# Patient Record
Sex: Female | Born: 1950 | Race: Black or African American | Hispanic: No | Smoking: Current every day smoker
Health system: Southern US, Community
[De-identification: ages and names within clinical notes are randomized; demographics above are authoritative.]

## PROBLEM LIST (undated history)

## (undated) DIAGNOSIS — Q780 Osteogenesis imperfecta: Secondary | ICD-10-CM

## (undated) DIAGNOSIS — M810 Age-related osteoporosis without current pathological fracture: Secondary | ICD-10-CM

## (undated) DIAGNOSIS — M797 Fibromyalgia: Secondary | ICD-10-CM

## (undated) DIAGNOSIS — M1990 Unspecified osteoarthritis, unspecified site: Secondary | ICD-10-CM

## (undated) DIAGNOSIS — I7300 Raynaud's syndrome without gangrene: Secondary | ICD-10-CM

## (undated) DIAGNOSIS — G4700 Insomnia, unspecified: Secondary | ICD-10-CM

## (undated) DIAGNOSIS — C801 Malignant (primary) neoplasm, unspecified: Secondary | ICD-10-CM

## (undated) DIAGNOSIS — E785 Hyperlipidemia, unspecified: Secondary | ICD-10-CM

## (undated) DIAGNOSIS — F419 Anxiety disorder, unspecified: Secondary | ICD-10-CM

## (undated) DIAGNOSIS — F329 Major depressive disorder, single episode, unspecified: Secondary | ICD-10-CM

## (undated) DIAGNOSIS — I10 Essential (primary) hypertension: Secondary | ICD-10-CM

## (undated) DIAGNOSIS — M542 Cervicalgia: Secondary | ICD-10-CM

## (undated) DIAGNOSIS — G43909 Migraine, unspecified, not intractable, without status migrainosus: Secondary | ICD-10-CM

## (undated) DIAGNOSIS — M25532 Pain in left wrist: Secondary | ICD-10-CM

## (undated) DIAGNOSIS — J449 Chronic obstructive pulmonary disease, unspecified: Secondary | ICD-10-CM

## (undated) DIAGNOSIS — G5600 Carpal tunnel syndrome, unspecified upper limb: Secondary | ICD-10-CM

## (undated) DIAGNOSIS — F32A Depression, unspecified: Secondary | ICD-10-CM

## (undated) HISTORY — DX: Hyperlipidemia, unspecified: E78.5

## (undated) HISTORY — DX: Raynaud's syndrome without gangrene: I73.00

## (undated) HISTORY — DX: Cervicalgia: M54.2

## (undated) HISTORY — DX: Insomnia, unspecified: G47.00

## (undated) HISTORY — PX: HEMORROIDECTOMY: SUR656

## (undated) HISTORY — PX: ABDOMINAL HYSTERECTOMY: SHX81

## (undated) HISTORY — DX: Major depressive disorder, single episode, unspecified: F32.9

## (undated) HISTORY — DX: Depression, unspecified: F32.A

## (undated) HISTORY — DX: Pain in left wrist: M25.532

## (undated) HISTORY — PX: BREAST BIOPSY: SHX20

## (undated) HISTORY — DX: Anxiety disorder, unspecified: F41.9

## (undated) HISTORY — DX: Migraine, unspecified, not intractable, without status migrainosus: G43.909

## (undated) HISTORY — PX: TUBAL LIGATION: SHX77

## (undated) HISTORY — DX: Carpal tunnel syndrome, unspecified upper limb: G56.00

---

## 2006-01-06 ENCOUNTER — Emergency Department (HOSPITAL_COMMUNITY): Admission: EM | Admit: 2006-01-06 | Discharge: 2006-01-06 | Payer: Self-pay | Admitting: Pediatrics

## 2008-11-05 ENCOUNTER — Emergency Department (HOSPITAL_COMMUNITY): Admission: EM | Admit: 2008-11-05 | Discharge: 2008-11-05 | Payer: Self-pay | Admitting: Emergency Medicine

## 2009-10-19 ENCOUNTER — Ambulatory Visit: Payer: Self-pay | Admitting: Surgery

## 2009-10-19 ENCOUNTER — Encounter (INDEPENDENT_AMBULATORY_CARE_PROVIDER_SITE_OTHER): Payer: Self-pay | Admitting: Emergency Medicine

## 2009-10-19 ENCOUNTER — Emergency Department (HOSPITAL_COMMUNITY): Admission: EM | Admit: 2009-10-19 | Discharge: 2009-10-19 | Payer: Self-pay | Admitting: Emergency Medicine

## 2009-10-22 ENCOUNTER — Emergency Department (HOSPITAL_COMMUNITY): Admission: EM | Admit: 2009-10-22 | Discharge: 2009-10-23 | Payer: Self-pay | Admitting: Emergency Medicine

## 2010-10-05 ENCOUNTER — Emergency Department (HOSPITAL_COMMUNITY)
Admission: EM | Admit: 2010-10-05 | Discharge: 2010-10-05 | Disposition: A | Discharge status: Home / Self Care | Payer: Medicaid Other | Attending: Emergency Medicine | Admitting: Emergency Medicine

## 2010-10-05 DIAGNOSIS — J449 Chronic obstructive pulmonary disease, unspecified: Secondary | ICD-10-CM | POA: Insufficient documentation

## 2010-10-05 DIAGNOSIS — M069 Rheumatoid arthritis, unspecified: Secondary | ICD-10-CM | POA: Insufficient documentation

## 2010-10-05 DIAGNOSIS — M25569 Pain in unspecified knee: Secondary | ICD-10-CM | POA: Insufficient documentation

## 2010-10-05 DIAGNOSIS — J4489 Other specified chronic obstructive pulmonary disease: Secondary | ICD-10-CM | POA: Insufficient documentation

## 2010-10-05 DIAGNOSIS — IMO0001 Reserved for inherently not codable concepts without codable children: Secondary | ICD-10-CM | POA: Insufficient documentation

## 2010-10-05 DIAGNOSIS — M25529 Pain in unspecified elbow: Secondary | ICD-10-CM | POA: Insufficient documentation

## 2010-10-05 DIAGNOSIS — I10 Essential (primary) hypertension: Secondary | ICD-10-CM | POA: Insufficient documentation

## 2011-03-15 ENCOUNTER — Emergency Department (HOSPITAL_COMMUNITY): Payer: Medicaid Other

## 2011-03-15 ENCOUNTER — Emergency Department (HOSPITAL_COMMUNITY)
Admission: EM | Admit: 2011-03-15 | Discharge: 2011-03-15 | Disposition: A | Discharge status: Home / Self Care | Payer: Medicaid Other | Attending: Emergency Medicine | Admitting: Emergency Medicine

## 2011-03-15 DIAGNOSIS — M069 Rheumatoid arthritis, unspecified: Secondary | ICD-10-CM | POA: Insufficient documentation

## 2011-03-15 DIAGNOSIS — Z87891 Personal history of nicotine dependence: Secondary | ICD-10-CM | POA: Insufficient documentation

## 2011-03-15 LAB — POCT I-STAT, CHEM 8
BUN: <3 mg/dL — ABNORMAL LOW (ref 6–23)
Calcium, Ion: 1.19 mmol/L (ref 1.12–1.32)
Chloride: 103 mEq/L (ref 96–112)
Creatinine, Ser: 0.9 mg/dL (ref 0.50–1.10)
Glucose, Bld: 90 mg/dL (ref 70–99)
HCT: 40 % (ref 36.0–46.0)
Hemoglobin: 13.6 g/dL (ref 12.0–15.0)
Potassium: 3.5 mEq/L (ref 3.5–5.1)
Sodium: 143 mEq/L (ref 135–145)
TCO2: 28 mmol/L (ref 0–100)

## 2011-03-15 LAB — URINALYSIS, ROUTINE W REFLEX MICROSCOPIC
Bilirubin Urine: NEGATIVE
Glucose, UA: NEGATIVE mg/dL
Hgb urine dipstick: NEGATIVE
Ketones, ur: NEGATIVE mg/dL
Leukocytes, UA: NEGATIVE
Nitrite: NEGATIVE
Protein, ur: NEGATIVE mg/dL
Specific Gravity, Urine: 1.011 (ref 1.005–1.030)
Urobilinogen, UA: 0.2 mg/dL (ref 0.0–1.0)
pH: 7 (ref 5.0–8.0)

## 2011-03-15 LAB — SEDIMENTATION RATE: Sed Rate: 23 mm/hr — ABNORMAL HIGH (ref 0–22)

## 2011-05-02 ENCOUNTER — Emergency Department (HOSPITAL_COMMUNITY)
Admission: EM | Admit: 2011-05-02 | Discharge: 2011-05-02 | Disposition: A | Discharge status: Home / Self Care | Payer: Medicaid Other | Attending: Emergency Medicine | Admitting: Emergency Medicine

## 2011-05-02 DIAGNOSIS — J4489 Other specified chronic obstructive pulmonary disease: Secondary | ICD-10-CM | POA: Insufficient documentation

## 2011-05-02 DIAGNOSIS — I10 Essential (primary) hypertension: Secondary | ICD-10-CM | POA: Insufficient documentation

## 2011-05-02 DIAGNOSIS — E876 Hypokalemia: Secondary | ICD-10-CM | POA: Insufficient documentation

## 2011-05-02 DIAGNOSIS — J449 Chronic obstructive pulmonary disease, unspecified: Secondary | ICD-10-CM | POA: Insufficient documentation

## 2011-05-02 DIAGNOSIS — M069 Rheumatoid arthritis, unspecified: Secondary | ICD-10-CM | POA: Insufficient documentation

## 2011-05-02 DIAGNOSIS — M549 Dorsalgia, unspecified: Secondary | ICD-10-CM | POA: Insufficient documentation

## 2011-05-02 DIAGNOSIS — M129 Arthropathy, unspecified: Secondary | ICD-10-CM | POA: Insufficient documentation

## 2011-05-02 LAB — POCT I-STAT, CHEM 8
BUN: <3 mg/dL — ABNORMAL LOW (ref 6–23)
Calcium, Ion: 1.21 mmol/L (ref 1.12–1.32)
Chloride: 104 mEq/L (ref 96–112)
Creatinine, Ser: 0.9 mg/dL (ref 0.50–1.10)
Glucose, Bld: 91 mg/dL (ref 70–99)
HCT: 41 % (ref 36.0–46.0)
Hemoglobin: 13.9 g/dL (ref 12.0–15.0)
Potassium: 2.9 mEq/L — ABNORMAL LOW (ref 3.5–5.1)
Sodium: 142 mEq/L (ref 135–145)
TCO2: 28 mmol/L (ref 0–100)

## 2011-05-02 LAB — URINALYSIS, ROUTINE W REFLEX MICROSCOPIC
Glucose, UA: NEGATIVE mg/dL
Hgb urine dipstick: NEGATIVE
Ketones, ur: NEGATIVE mg/dL
Leukocytes, UA: NEGATIVE
Nitrite: NEGATIVE
Protein, ur: NEGATIVE mg/dL
Specific Gravity, Urine: 1.022 (ref 1.005–1.030)
Urobilinogen, UA: 1 mg/dL (ref 0.0–1.0)
pH: 5.5 (ref 5.0–8.0)

## 2011-05-09 ENCOUNTER — Encounter: Payer: Self-pay | Admitting: *Deleted

## 2011-05-09 ENCOUNTER — Emergency Department (HOSPITAL_COMMUNITY)
Admission: EM | Admit: 2011-05-09 | Discharge: 2011-05-09 | Discharge status: Against Medical Advice | Payer: Medicaid Other | Attending: Emergency Medicine | Admitting: Emergency Medicine

## 2011-05-09 DIAGNOSIS — M79609 Pain in unspecified limb: Secondary | ICD-10-CM | POA: Insufficient documentation

## 2011-05-09 DIAGNOSIS — M545 Low back pain, unspecified: Secondary | ICD-10-CM | POA: Insufficient documentation

## 2011-05-09 DIAGNOSIS — E876 Hypokalemia: Secondary | ICD-10-CM | POA: Insufficient documentation

## 2011-05-09 HISTORY — DX: Fibromyalgia: M79.7

## 2011-05-09 HISTORY — DX: Unspecified osteoarthritis, unspecified site: M19.90

## 2011-05-09 HISTORY — DX: Chronic obstructive pulmonary disease, unspecified: J44.9

## 2011-05-09 HISTORY — DX: Essential (primary) hypertension: I10

## 2011-05-09 NOTE — ED Notes (Signed)
Pt called, no answer.  Called in main and triage waiting

## 2011-05-09 NOTE — ED Notes (Signed)
Pt was seen here last week with back pain and low potassium.  She is waiting to see pcp because they are waiting on infor from [place].  Pt needs potassium rechecked and now with pain in back that hurts to breath.  PT states that the lower back pain is now spreading down right leg.  Pt denies incontinence

## 2011-05-12 ENCOUNTER — Other Ambulatory Visit: Payer: Self-pay

## 2011-05-12 ENCOUNTER — Emergency Department (HOSPITAL_COMMUNITY)
Admission: EM | Admit: 2011-05-12 | Discharge: 2011-05-12 | Discharge status: Against Medical Advice | Payer: Medicaid Other | Attending: Emergency Medicine | Admitting: Emergency Medicine

## 2011-05-12 ENCOUNTER — Encounter (HOSPITAL_COMMUNITY): Payer: Self-pay | Admitting: *Deleted

## 2011-05-12 DIAGNOSIS — R0602 Shortness of breath: Secondary | ICD-10-CM | POA: Insufficient documentation

## 2011-05-12 DIAGNOSIS — R079 Chest pain, unspecified: Secondary | ICD-10-CM | POA: Insufficient documentation

## 2011-05-12 LAB — BASIC METABOLIC PANEL
BUN: 6 mg/dL (ref 6–23)
CO2: 32 mEq/L (ref 19–32)
Calcium: 10.3 mg/dL (ref 8.4–10.5)
Chloride: 103 mEq/L (ref 96–112)
Creatinine, Ser: 0.76 mg/dL (ref 0.50–1.10)
GFR calc Af Amer: >90 mL/min (ref >90)
GFR calc non Af Amer: 90 mL/min — ABNORMAL LOW (ref >90)
Glucose, Bld: 92 mg/dL (ref 70–99)
Potassium: 4.1 mEq/L (ref 3.5–5.1)
Sodium: 143 mEq/L (ref 135–145)

## 2011-05-12 LAB — CBC
HCT: 42.4 % (ref 36.0–46.0)
Hemoglobin: 14.2 g/dL (ref 12.0–15.0)
MCH: 27.5 pg (ref 26.0–34.0)
MCHC: 33.5 g/dL (ref 30.0–36.0)
MCV: 82 fL (ref 78.0–100.0)
Platelets: 177 10*3/uL (ref 150–400)
RBC: 5.17 MIL/uL — ABNORMAL HIGH (ref 3.87–5.11)
RDW: 14.1 % (ref 11.5–15.5)
WBC: 8.5 10*3/uL (ref 4.0–10.5)

## 2011-05-12 NOTE — ED Notes (Signed)
Pt states she came to ed for eval of cp that radiates straight through to back. Pt states she was in ed on Saturday but never called so she left. Pain is described as spasms. Pt new to the area.

## 2011-06-18 ENCOUNTER — Encounter: Payer: Self-pay | Admitting: Internal Medicine

## 2011-06-19 ENCOUNTER — Ambulatory Visit (INDEPENDENT_AMBULATORY_CARE_PROVIDER_SITE_OTHER)
Admission: RE | Admit: 2011-06-19 | Discharge: 2011-06-19 | Disposition: A | Discharge status: Home / Self Care | Payer: Medicaid Other | Source: Ambulatory Visit | Attending: Internal Medicine | Admitting: Internal Medicine

## 2011-06-19 ENCOUNTER — Ambulatory Visit (INDEPENDENT_AMBULATORY_CARE_PROVIDER_SITE_OTHER): Payer: Medicaid Other | Admitting: Internal Medicine

## 2011-06-19 ENCOUNTER — Encounter: Payer: Self-pay | Admitting: Internal Medicine

## 2011-06-19 VITALS — BP 130/88 | HR 71 | Temp 98.5°F | Ht 67.0 in | Wt 184.0 lb

## 2011-06-19 DIAGNOSIS — J449 Chronic obstructive pulmonary disease, unspecified: Secondary | ICD-10-CM

## 2011-06-19 DIAGNOSIS — I10 Essential (primary) hypertension: Secondary | ICD-10-CM

## 2011-06-19 MED ORDER — NEBIVOLOL HCL 5 MG PO TABS: 5.0000 mg | ORAL_TABLET | Freq: Every day | ORAL | Status: DC

## 2011-06-19 NOTE — Progress Notes (Signed)
  Subjective:    Patient ID: Denise Simmons, female    DOB: [birth year], 60 y.o.   MRN: [id number]  HPI  60 yobf still smoker with COPD    06/19/2011 60st pulmonary cc worse sob  since Oct 30th 2012 when moved to [place] cc sorethroat, trouble swallowing s/p dilation on ACEI cough severe, mostly daytime and dry, comes and goes,  right now better,  Sometimes vomiting. mutiple nebs and inhalers not really helping but the cough med helps the most with her breathing which is better as long as cough is controlled  Sleeping ok without nocturnal  or early am exacerbation  of respiratory  c/o's or need for noct saba. Also denies any obvious fluctuation of symptoms with weather or environmental changes or other aggravating or alleviating factors except as outlined above   Has RA but  States ok control.    Review of Systems  Constitutional: Negative for fever, chills and unexpected weight change.  HENT: Positive for ear pain, sore throat and trouble swallowing. Negative for nosebleeds, congestion, rhinorrhea, sneezing, dental problem, voice change, postnasal drip and sinus pressure.   Eyes: Negative for visual disturbance.  Respiratory: Positive for cough and shortness of breath. Negative for choking.   Cardiovascular: Negative for chest pain and leg swelling.  Gastrointestinal: Negative for vomiting, abdominal pain and diarrhea.  Genitourinary: Negative for difficulty urinating.  Musculoskeletal: Positive for arthralgias.  Skin: Negative for rash.  Neurological: Negative for tremors, syncope and headaches.  Hematological: Does not bruise/bleed easily.       Objective:   Physical Exam  06/19/2011  Wt 184  amb bf nad HEENT mild turbinate edema.  Oropharynx no thrush or excess pnd or cobblestoning.  No JVD or cervical adenopathy. Mild accessory muscle hypertrophy. Trachea midline, nl thryroid. Chest was hyperinflated by percussion with diminished breath sounds and moderate increased exp time  without wheeze. Hoover sign positive at mid inspiration. Regular rate and rhythm without murmur gallop or rub or increase P2 or edema.  Abd: no hsm, nl excursion. Ext warm without cyanosis or clubbing.    CXR  06/19/2011 :  Comparison: 03/15/2011  Findings: Heart and mediastinal contours are within normal limits. No focal opacities or effusions. No acute bony abnormality.  IMPRESSION: No active cardiopulmonary disease.      Assessment & Plan:

## 2011-06-19 NOTE — Patient Instructions (Addendum)
Try aciphex  20mg   (or Nexium) Take 30-60 min before first meal of the day and Pepcid 20 mg one bedtime   Stop lisinopril and start bystolic 5 mg one in it's place  Stop smoking before it stops it   Only use the nebulizer for your breathing if you need it  Please remember to go to the  x-ray department downstairs for your tests - we will call you with the results when they are available.    Please schedule a follow up office visit in 4 weeks, sooner if needed with PFT's

## 2011-06-20 DIAGNOSIS — I10 Essential (primary) hypertension: Secondary | ICD-10-CM | POA: Insufficient documentation

## 2011-06-20 NOTE — Assessment & Plan Note (Addendum)
DDX of  difficult airways managment all start with A and  include Adherence, Ace Inhibitors, Acid Reflux, Active Sinus Disease, Alpha 1 Antitripsin deficiency, Anxiety masquerading as Airways dz,  ABPA,  allergy(esp in young), Aspiration (esp in elderly), Adverse effects of DPI,  Active smokers, plus two Bs  = Bronchiectasis and Beta blocker use..and one C= CHF   Ace inhibitors leading suspect here given all the upper airway symptoms she has. Try off these first and then regroup with pft's  Acid reflux is sometimes the fuel for the fire of upper airways instability and acei intolerance > try rx with ppi and h2hs and diet, discussed

## 2011-06-20 NOTE — Assessment & Plan Note (Addendum)
ACE inhibitors are problematic in  pts with airway complaints because  even experienced pulmonologists can't always distinguish ace effects from copd/asthma.  By themselves they don't actually cause a problem, much like oxygen can't by itself start a fire, but they certainly serve as a powerful catalyst or enhancer for any "fire"  or inflammatory process in the upper airway, be it caused by an ET  tube or more commonly reflux (especially in the obese or pts with known GERD or who are on biphoshonates).   For now try on Bystolic, the most beta -1  selective Beta blocker available in sample form, with bisoprolol the most selective generic choice  on the market.

## 2011-07-23 ENCOUNTER — Ambulatory Visit: Payer: Medicaid Other | Admitting: Internal Medicine

## 2011-08-07 ENCOUNTER — Encounter: Payer: Self-pay | Admitting: Internal Medicine

## 2011-08-07 ENCOUNTER — Ambulatory Visit (INDEPENDENT_AMBULATORY_CARE_PROVIDER_SITE_OTHER): Payer: Medicaid Other | Admitting: Internal Medicine

## 2011-08-07 VITALS — BP 128/70 | HR 66 | Temp 98.3°F | Ht 67.0 in | Wt 186.0 lb

## 2011-08-07 DIAGNOSIS — M069 Rheumatoid arthritis, unspecified: Secondary | ICD-10-CM

## 2011-08-07 DIAGNOSIS — J449 Chronic obstructive pulmonary disease, unspecified: Secondary | ICD-10-CM

## 2011-08-07 DIAGNOSIS — I10 Essential (primary) hypertension: Secondary | ICD-10-CM

## 2011-08-07 DIAGNOSIS — F17210 Nicotine dependence, cigarettes, uncomplicated: Secondary | ICD-10-CM | POA: Insufficient documentation

## 2011-08-07 DIAGNOSIS — F17200 Nicotine dependence, unspecified, uncomplicated: Secondary | ICD-10-CM

## 2011-08-07 LAB — PULMONARY FUNCTION TEST

## 2011-08-07 MED ORDER — NEBIVOLOL HCL 5 MG PO TABS: 5.0000 mg | ORAL_TABLET | Freq: Every day | ORAL | Status: DC

## 2011-08-07 NOTE — Progress Notes (Signed)
  Subjective:    Patient ID: Denise Simmons, female    DOB: 1950/09/14    MRN: [id number]    Brief patient profile:  89 yobf still smoker with COPD and refractory symptoms on ACEI when first seen in pulmonary clinic in 2012 but proved to have very minimal airflow obst by pft's 08/07/2011   HPI 06/19/2011 1st pulmonary cc worse sob  since Oct 30th 2012 when moved to [place] cc sorethroat, trouble swallowing s/p dilation on ACEI cough severe, mostly daytime and dry, comes and goes,  right now better,  Sometimes vomiting. mutiple nebs and inhalers not really helping but the cough med helps the most with her breathing which is better as long as cough is controlled Has RA but  States ok control. rec Try aciphex  20mg   (or Nexium) Take 30-60 min before first meal of the day and Pepcid 20 mg one bedtime  Stop lisinopril and start bystolic 5 mg one in it's place Stop smoking before it stops it  Only use the nebulizer for your breathing if you need it   08/07/2011 Gottlieb Zuercher/ ov  still smoking but cough much better not needing any breathing treatments at all, main c/o is RA symptoms getting worse and concerned being referred to [facility] since it's so far for her to go.  No excess mucus, no limiting sob  Sleeping ok without nocturnal  or early am exacerbation  of respiratory  c/o's or need for noct saba. Also denies any obvious fluctuation of symptoms with weather or environmental changes or other aggravating or alleviating factors except as outlined above   ROS  At present neg for  any significant sore throat, dysphagia, itching, sneezing,  nasal congestion or excess/ purulent secretions,  fever, chills, sweats, unintended wt loss, pleuritic or exertional cp, hempoptysis, orthopnea pnd or leg swelling.  Also denies presyncope, palpitations, heartburn, abdominal pain, nausea, vomiting, diarrhea  or change in bowel or urinary habits, dysuria,hematuria,  rash, arthralgias, visual complaints, headache, numbness weakness or  ataxia.         Objective:   Physical Exam  06/19/2011  Wt 184 > 08/07/2011 186  amb bf nad HEENT mild turbinate edema.  Oropharynx no thrush or excess pnd or cobblestoning.  No JVD or cervical adenopathy. Mild accessory muscle hypertrophy. Trachea midline, nl thryroid. Chest was minimally  hyperinflated by percussion with diminished breath sounds and very min increased exp time without wheeze. Hoover sign positive at end inspiration. Regular rate and rhythm without murmur gallop or rub or increase P2 or edema.  Abd: no hsm, nl excursion. Ext warm without cyanosis or clubbing.    CXR  06/19/2011 :  Comparison: 03/15/2011  Findings: Heart and mediastinal contours are within normal limits. No focal opacities or effusions. No acute bony abnormality.  IMPRESSION: No active cardiopulmonary disease.      Assessment & Plan:

## 2011-08-07 NOTE — Patient Instructions (Signed)
Continue bystolic 5mg  daily (generic bisoprolol 5 mg)   You cannot take ACE inhibitor (ends in pril)  Please see patient coordinator before you leave today  to schedule rheumatology evaluation in [place]  Stop smoking before it stops you - clearly not too late to stop  Follow up here is as needed.

## 2011-08-07 NOTE — Progress Notes (Deleted)
  Subjective:    Patient ID: Denise Simmons, female    DOB: 12-Mar-1951, 61 y.o.   MRN: [id number]   Brief patient profile:  80 yobf still smoker with  With refractory "COPD symptoms" on ACEI seen 08/07/2011 in pulmonary clinic by Winslow Verrill  HPI 06/19/2011 1st pulmonary ov/ [facility] cc worse sob  since Oct 30th 2012 when moved to [place] cc sorethroat, trouble swallowing s/p dilation on ACEI cough severe, mostly daytime and dry, comes and goes,  right now better,  Sometimes vomiting. mutiple nebs and inhalers not really helping but the cough med helps the most with her breathing which is better as long as cough is controlled Has RA but  States ok control rec Try aciphex  20mg   (or Nexium) Take 30-60 min before first meal of the day and Pepcid 20 mg one bedtime  Stop lisinopril and start bystolic 5 mg one in it's place Stop smoking before it stops it  Only use the nebulizer for your breathing if you need it        08/07/2011 f/u ov/Kaeden Mester cc   Sleeping ok without nocturnal  or early am exacerbation  of respiratory  c/o's or need for noct saba. Also denies any obvious fluctuation of symptoms with weather or environmental changes or other aggravating or alleviating factors except as outlined above   .          Objective:   Physical Exam  06/19/2011  Wt 184 > 08/07/2011  186  amb bf nad HEENT mild turbinate edema.  Oropharynx no thrush or excess pnd or cobblestoning.  No JVD or cervical adenopathy. Mild accessory muscle hypertrophy. Trachea midline, nl thryroid. Chest was hyperinflated by percussion with diminished breath sounds and moderate increased exp time without wheeze. Hoover sign positive at mid inspiration. Regular rate and rhythm without murmur gallop or rub or increase P2 or edema.  Abd: no hsm, nl excursion. Ext warm without cyanosis or clubbing.    CXR  06/19/2011 :  Comparison: 03/15/2011  Findings: Heart and mediastinal contours are within normal limits. No focal opacities or  effusions. No acute bony abnormality.  IMPRESSION: No active cardiopulmonary disease.      Assessment & Plan:

## 2011-08-07 NOTE — Assessment & Plan Note (Signed)
Adequate control on present rx, reviewed need to stay off ACEI  ACE inhibitors are problematic in  pts with airway complaints because  even experienced pulmonologists can't always distinguish ace effects from copd/asthma/pnds/ allergies etc.  By themselves they don't actually cause a problem, much like oxygen can't by itself start a fire, but they certainly serve as a powerful catalyst or enhancer for any "fire"  or inflammatory process in the upper airway, be it caused by an ET  tube or more commonly reflux (especially in the obese or pts with known GERD or who are on biphoshonates) or URI's, due to interference with bradykinin clearance.  The effects of acei on bradykinin levels occurs in 100% of pt's on acei (unless they surreptitiously stop the med!) but the classic cough is only reported in 5%.  This leaves 95% of pts on acei's  with a variety of syndromes including no identifiable symptom in most  vs non-specific symptoms that wax and wane depending on what other insult is occuring at the level of the upper airway.   In her case rec avoid acei ever and Strongly prefer in this setting: Bystolic, the most beta -1  selective Beta blocker available in sample form, with bisoprolol the most selective generic choice  on the market.

## 2011-08-07 NOTE — Assessment & Plan Note (Signed)
-    off ACEI 06/19/2011  > much better 08/07/2011     -  PFT's  08/07/2011  with min airflow obst but DLCO 60% corrects to 86% ? RA related  GOLD 0 copd / still smoking with disproportionate decrease dlco worrisome for ild related to smoking or ra  I reviewed the Fletcher curve with patient that basically indicates  if you quit smoking when your best day FEV1 is still well preserved (which hers certainly is)  it is highly unlikely you will progress to severe disease and informed the patient there was no medication on the market that has proven to change the curve or the likelihood of progression.  Therefore stopping smoking and maintaining abstinence is the most important aspect of care, not choice of inhalers or for that matter, doctors.    Pulmonary f/u can be prn

## 2011-08-07 NOTE — Assessment & Plan Note (Signed)

## 2011-08-07 NOTE — Progress Notes (Signed)
PFT done today. 

## 2011-08-14 ENCOUNTER — Other Ambulatory Visit: Payer: Self-pay | Admitting: Diagnostic Neuroimaging

## 2011-08-14 DIAGNOSIS — R51 Headache: Secondary | ICD-10-CM

## 2011-08-14 DIAGNOSIS — M545 Low back pain, unspecified: Secondary | ICD-10-CM

## 2011-08-17 ENCOUNTER — Emergency Department (HOSPITAL_COMMUNITY)
Admission: EM | Admit: 2011-08-17 | Discharge: 2011-08-17 | Disposition: A | Discharge status: Home / Self Care | Payer: Medicaid Other | Attending: Emergency Medicine | Admitting: Emergency Medicine

## 2011-08-17 ENCOUNTER — Encounter (HOSPITAL_COMMUNITY): Payer: Self-pay | Admitting: Emergency Medicine

## 2011-08-17 DIAGNOSIS — G8929 Other chronic pain: Secondary | ICD-10-CM | POA: Insufficient documentation

## 2011-08-17 DIAGNOSIS — M79609 Pain in unspecified limb: Secondary | ICD-10-CM | POA: Insufficient documentation

## 2011-08-17 DIAGNOSIS — M545 Low back pain, unspecified: Secondary | ICD-10-CM | POA: Insufficient documentation

## 2011-08-17 DIAGNOSIS — J4489 Other specified chronic obstructive pulmonary disease: Secondary | ICD-10-CM | POA: Insufficient documentation

## 2011-08-17 DIAGNOSIS — M5430 Sciatica, unspecified side: Secondary | ICD-10-CM | POA: Insufficient documentation

## 2011-08-17 DIAGNOSIS — I10 Essential (primary) hypertension: Secondary | ICD-10-CM | POA: Insufficient documentation

## 2011-08-17 DIAGNOSIS — J449 Chronic obstructive pulmonary disease, unspecified: Secondary | ICD-10-CM | POA: Insufficient documentation

## 2011-08-17 DIAGNOSIS — I7300 Raynaud's syndrome without gangrene: Secondary | ICD-10-CM | POA: Insufficient documentation

## 2011-08-17 DIAGNOSIS — IMO0001 Reserved for inherently not codable concepts without codable children: Secondary | ICD-10-CM | POA: Insufficient documentation

## 2011-08-17 MED ORDER — HYDROMORPHONE HCL PF 1 MG/ML IJ SOLN
1.0000 mg | Freq: Once | INTRAMUSCULAR | Status: AC
  Administered 2011-08-17: 1 mg via INTRAMUSCULAR
  Filled 2011-08-17: qty 1

## 2011-08-17 NOTE — ED Provider Notes (Signed)
History     CSN: [id number]  Arrival date & time 08/17/11  1108   First MD Initiated Contact with Patient 08/17/11 1207      Chief Complaint  Patient presents with  . Back Pain    (Consider location/radiation/quality/duration/timing/severity/associated sxs/prior treatment) HPI Comments: Patient has a history of chronic back pain for which he sees a pain clinic in [place]. The pain is in her right lower back going down her right leg. She recently over the last couple months has had some worsening in symptoms with worsening pain down the right leg and has seen a neurologist here in [place] for this. She is scheduled to have MRI on February 22. She denies any new symptoms or new injuries. Denies any numbness or weakness in her legs. Denies any loss of bowel or bladder control. She just came in today because the pain has not been controlled by her pain medicine.  Patient is a 61 y.o. female presenting with back pain. The history is provided by the patient.  Back Pain  This is a chronic problem. Pertinent negatives include no chest pain, no fever, no numbness, no headaches, no abdominal pain and no weakness.    Past Medical History  Diagnosis Date  . Arthritis   . COPD (chronic obstructive pulmonary disease)   . Fibromyalgia   . Hypertension   . Insomnia   . Allergic rhinitis   . Neck pain   . Raynaud's disease     Past Surgical History  Procedure Date  . Abdominal hysterectomy   . Tubal ligation     Family History  Problem Relation Age of Onset  . Rheum arthritis Mother   . Breast cancer Mother   . Breast cancer Sister   . Ovarian cancer Maternal Grandmother   . Rectal cancer Paternal Grandmother     History  Substance Use Topics  . Smoking status: Current Everyday Smoker -- 1.0 packs/day for 25 years    Types: Cigarettes  . Smokeless tobacco: Never Used  . Alcohol Use: No     Quit in 2001    OB History    Grav Para Term Preterm Abortions TAB SAB Ect Mult  Living                  Review of Systems  Constitutional: Negative for fever, chills, diaphoresis and fatigue.  HENT: Negative for congestion, rhinorrhea and sneezing.   Eyes: Negative.   Respiratory: Negative for cough, chest tightness and shortness of breath.   Cardiovascular: Negative for chest pain and leg swelling.  Gastrointestinal: Negative for nausea, vomiting, abdominal pain, diarrhea and blood in stool.  Genitourinary: Negative for frequency, hematuria, flank pain and difficulty urinating.  Musculoskeletal: Positive for back pain. Negative for arthralgias.  Skin: Negative for rash.  Neurological: Negative for dizziness, speech difficulty, weakness, numbness and headaches.    Allergies  Prednisone; Celebrex; Methotrexate derivatives; Naproxen; Nsaids; Penicillins; and Sulfa antibiotics  Home Medications   Current Outpatient Rx  Name Route Sig Dispense Refill  . ALBUTEROL SULFATE HFA 108 (90 BASE) MCG/ACT IN AERS Inhalation Inhale 2 puffs into the lungs 3 (three) times daily as needed. For shortness of breath     . ALBUTEROL SULFATE (2.5 MG/3ML) 0.083% IN NEBU Nebulization Take 2.5 mg by nebulization every 6 (six) hours as needed. For wheezing     . CYCLOBENZAPRINE HCL 10 MG PO TABS Oral Take 10 mg by mouth 3 (three) times daily as needed. For spasms    .  ESOMEPRAZOLE MAGNESIUM 40 MG PO CPDR Oral Take 40 mg by mouth 2 (two) times daily.      Marland Kitchen MONTELUKAST SODIUM 10 MG PO TABS Oral Take 10 mg by mouth at bedtime.      . NEBIVOLOL HCL 5 MG PO TABS Oral Take 1 tablet (5 mg total) by mouth daily. 30 tablet 11  . NYSTATIN 100000 UNIT/GM EX CREA Topical Apply 1 application topically at bedtime as needed. For sores    . OXYCODONE-ACETAMINOPHEN 7.5-500 MG PO TABS Oral Take 1 tablet by mouth every 6 (six) hours as needed. for pain    . POTASSIUM CHLORIDE 10 MEQ PO TBCR Oral Take 10 mEq by mouth 2 (two) times daily.      Marland Kitchen PREGABALIN 75 MG PO CAPS Oral Take 75 mg by mouth 2 (two)  times daily.      Marland Kitchen ZOLPIDEM TARTRATE 10 MG PO TABS Oral Take 10 mg by mouth at bedtime as needed. For sleep     . ADALIMUMAB 40 MG/0.8ML [place] KIT Subcutaneous Inject 40 mg into the skin every 3 (three) months.     . AMITRIPTYLINE HCL 150 MG PO TABS Oral Take 150 mg by mouth at bedtime.        BP 136/81  Pulse 66  Temp(Src) 98.8 F (37.1 C) (Oral)  Resp 18  SpO2 95%  Physical Exam  Constitutional: She is oriented to person, place, and time. She appears well-developed and well-nourished.  HENT:  Head: Normocephalic and atraumatic.  Eyes: Pupils are equal, round, and reactive to light.  Neck: Normal range of motion. Neck supple.  Cardiovascular: Normal rate, regular rhythm and normal heart sounds.   Pulmonary/Chest: Effort normal and breath sounds normal. No respiratory distress. She has no wheezes. She has no rales. She exhibits no tenderness.  Abdominal: Soft. Bowel sounds are normal. There is no tenderness. There is no rebound and no guarding.  Musculoskeletal: Normal range of motion. She exhibits no edema.       Positive tenderness to the right lower paraspinal area in the lumbar region and over the right sciatic nerve. Negative straight leg raise bilaterally.  Lymphadenopathy:    She has no cervical adenopathy.  Neurological: She is alert and oriented to person, place, and time. She has normal strength. She displays normal reflexes. No sensory deficit. She exhibits normal muscle tone.  Skin: Skin is warm and dry. No rash noted.  Psychiatric: She has a normal mood and affect.    ED Course  Procedures (including critical care time)  Labs Reviewed - No data to display No results found.   1. Sciatica       MDM  Patient with worsening of her chronic back pain. Advised her that we would not be able to do the MRI today. She has new neuro deficits. She has one scheduled on the 22nd which is appropriate. I also advised her that we can give her a shot of pain medicine here but  would not be able to change her outpatient pain medicines as she does go to a pain clinic. I advised her to contact her pain clinic regarding possible adjustment of her pain medicines.        Rolan Bucco, MD 08/17/11 [phone]

## 2011-08-17 NOTE — ED Notes (Signed)
Pt sts lower back pain with radiation down right leg x several months

## 2011-08-17 NOTE — Discharge Instructions (Signed)
Chronic Back Pain When back pain lasts longer than 3 months, it is called chronic back pain.This pain can be frustrating, but the cause of the pain is rarely dangerous.People with chronic back pain often go through certain periods that are more intense (flare-ups). CAUSES Chronic back pain can be caused by wear and tear (degeneration) on different structures in your back. These structures may include bones, ligaments, or discs. This degeneration may result in more pressure being placed on the nerves that travel to your legs and feet. This can lead to pain traveling from the low back down the back of the legs. When pain lasts longer than 3 months, it is not unusual for people to experience anxiety or depression. Anxiety and depression can also contribute to low back pain. TREATMENT  Establish a regular exercise plan. This is critical to improving your functional level.   Have a self-management plan for when you flare-up. Flare-ups rarely require a medical visit. Regular exercise will help reduce the intensity and frequency of your flare-ups.   Manage how you feel about your back pain and the rest of your life. Anxiety, depression, and feeling that you cannot alter your back pain have been shown to make back pain more intense and debilitating.   Medicines should never be your only treatment. They should be used along with other treatments to help you return to a more active lifestyle.   Procedures such as injections or surgery may be helpful but are rarely necessary. You may be able to get the same results with physical therapy or chiropractic care.  HOME CARE INSTRUCTIONS  Avoid bending, heavy lifting, prolonged sitting, and activities which make the problem worse.   Continue normal activity as much as possible.   Take brief periods of rest throughout the day to reduce your pain during flare-ups.   Follow your back exercise rehabilitation program. This can help reduce symptoms and prevent  more pain.   Only take over-the-counter or prescription medicines as directed by your caregiver. Muscle relaxants are sometimes prescribed. Narcotic pain medicine is discouraged for long-term pain, since addiction is a possible outcome.   If you smoke, quit.   Eat healthy foods and maintain a recommended body weight.  SEEK IMMEDIATE MEDICAL CARE IF:   You have weakness or numbness in one of your legs or feet.   You have trouble controlling your bladder or bowels.   You develop nausea, vomiting, abdominal pain, shortness of breath, or fainting.  Document Released: 07/23/2004 Document Revised: 02/25/2011 Document Reviewed: 11/03/2010 [employer] Patient Information 2012 [employer]. 

## 2011-08-18 ENCOUNTER — Encounter: Payer: Self-pay | Admitting: Internal Medicine

## 2011-08-20 ENCOUNTER — Ambulatory Visit
Admission: RE | Admit: 2011-08-20 | Discharge: 2011-08-20 | Disposition: A | Discharge status: Home / Self Care | Payer: Medicaid Other | Source: Ambulatory Visit | Attending: Diagnostic Neuroimaging | Admitting: Diagnostic Neuroimaging

## 2011-08-20 DIAGNOSIS — M545 Low back pain, unspecified: Secondary | ICD-10-CM

## 2011-08-20 DIAGNOSIS — R51 Headache: Secondary | ICD-10-CM

## 2011-08-21 ENCOUNTER — Other Ambulatory Visit: Payer: Medicaid Other

## 2011-09-15 ENCOUNTER — Telehealth: Payer: Self-pay | Admitting: Internal Medicine

## 2011-09-15 ENCOUNTER — Other Ambulatory Visit: Payer: Self-pay | Admitting: Neurosurgery

## 2011-09-15 NOTE — Telephone Encounter (Signed)
Received fax prior auth request for bystolic from [facility].  Called [phone] to initiate this.  Preferred medications are coreg, lopressor, atenolol, corgard, propanolol, sotalol.  Pt will need to try and fail at least 2 of these medications.  Dr. Sherene Sires, pls advise if bystolic 5 mg qd can be changed to one of these medications.  Thank you.    Pt's ID # [id number]

## 2011-09-15 NOTE — Telephone Encounter (Signed)
[id number] x1 

## 2011-09-15 NOTE — Telephone Encounter (Signed)
ATC pt NA wcb unable to leave [facility]

## 2011-09-15 NOTE — Telephone Encounter (Signed)
Not sure this is the right choice for her anyway so provide her samples of bystolic until her next ov with me or Tammy NP before samples run out

## 2011-09-16 NOTE — Telephone Encounter (Signed)
ATC pt- na wcb 

## 2011-09-16 NOTE — Telephone Encounter (Signed)
I spoke with pt and will come and pick up samples of bystolic. i have left these upfront for pick up and nothing further was needed

## 2011-09-16 NOTE — Telephone Encounter (Signed)
I spoke with pt and she states she is beginning to start coughing again. She went to her pcp yesterday and was told she had thrush adn was given medication. Pt wanted to schedule an apt in case she is not any better by next week. I scheduled her to see TP on Tuesday at 2:15 if not any better. Nothing further was needed

## 2011-09-22 ENCOUNTER — Encounter: Payer: Self-pay | Admitting: Adult Health

## 2011-09-22 ENCOUNTER — Ambulatory Visit (INDEPENDENT_AMBULATORY_CARE_PROVIDER_SITE_OTHER): Payer: Medicaid Other | Admitting: Adult Health

## 2011-09-22 VITALS — BP 126/86 | HR 77 | Temp 98.1°F | Ht 67.0 in | Wt 183.4 lb

## 2011-09-22 DIAGNOSIS — J449 Chronic obstructive pulmonary disease, unspecified: Secondary | ICD-10-CM

## 2011-09-22 MED ORDER — CEFDINIR 300 MG PO CAPS: 300.0000 mg | ORAL_CAPSULE | Freq: Two times a day (BID) | ORAL | Status: DC

## 2011-09-22 MED ORDER — HYDROCODONE-HOMATROPINE 5-1.5 MG/5ML PO SYRP: 5.0000 mL | ORAL_SOLUTION | Freq: Four times a day (QID) | ORAL | Status: DC | PRN

## 2011-09-22 NOTE — Progress Notes (Signed)
Subjective:    Patient ID: Denise Simmons, female    DOB: [birth year]    MRN: [id number]    Brief patient profile:  57 yobf still smoker with COPD and refractory symptoms on ACEI when first seen in pulmonary clinic in 2012 but proved to have very minimal airflow obst by pft's 08/07/2011   HPI 06/19/2011 1st pulmonary cc worse sob  since Oct 30th 2012 when moved to [place] cc sorethroat, trouble swallowing s/p dilation on ACEI cough severe, mostly daytime and dry, comes and goes,  right now better,  Sometimes vomiting. mutiple nebs and inhalers not really helping but the cough med helps the most with her breathing which is better as long as cough is controlled Has RA but  States ok control. rec Try aciphex  20mg   (or Nexium) Take 30-60 min before first meal of the day and Pepcid 20 mg one bedtime  Stop lisinopril and start bystolic 5 mg one in it's place Stop smoking before it stops it  Only use the nebulizer for your breathing if you need it   08/07/2011 Wert/ ov  still smoking but cough much better not needing any breathing treatments at all, main c/o is RA symptoms getting worse and concerned being referred to [facility] since it's so far for her to go.  No excess mucus, no limiting sob >>no changes   09/22/2011 Acute OV  Complains of pt c/o prod cough, ear ache, sore throat, and migraines x2weeks with occassional fever/chills, pt states mucous is green is color. pt has been treating with OTC dayquil with little to no relief. She has upcoming back surgery next month. Denies any hemoptysis, chest pain, orthopnea, abdominal pain, nausea, vomiting, or recent travel.  ROS:  Constitutional:   No  weight loss, night sweats,  Fevers, chills,  =fatigue, or  lassitude.  HEENT:   No headaches,  Difficulty swallowing,  Tooth/dental problems, or  Sore throat,                No sneezing, itching, ear ache,  +nasal congestion, post nasal drip,   CV:  No chest pain,  Orthopnea, PND, swelling in lower  extremities, anasarca, dizziness, palpitations, syncope.   GI  No heartburn, indigestion, abdominal pain, nausea, vomiting, diarrhea, change in bowel habits, loss of appetite, bloody stools.   Resp:    No coughing up of blood.   Marland Kitchen  No chest wall deformity  Skin: no rash or lesions.  GU: no dysuria, change in color of urine, no urgency or frequency.  No flank pain, no hematuria   MS:  No joint   swelling.    Psych:  No change in mood or affect. No depression or anxiety.  No memory loss.              Objective:   Physical Exam  06/19/2011  Wt 184 > 08/07/2011 186>>183 09/22/2011   GEN: A/Ox3; pleasant , NAD  HEENT:  [place]/AT,  EACs-clear, TMs-wnl, NOSE-clear drainage  THROAT-clear, no lesions, no postnasal drip or exudate noted.   NECK:  Supple w/ fair ROM; no JVD; normal carotid impulses w/o bruits; no thyromegaly or nodules palpated; no lymphadenopathy.  RESP  Coarse BS  w/o, wheezes/ rales/ or rhonchi.no accessory muscle use, no dullness to percussion  CARD:  RRR, no m/r/g  , no peripheral edema, pulses intact, no cyanosis or clubbing.  GI:   Soft & nt; nml bowel sounds; no organomegaly or masses detected.  Musco: Warm bil, no deformities or  joint swelling noted.   Neuro: alert, no focal deficits noted.    Skin: Warm, no lesions or rashes      Assessment & Plan:

## 2011-09-22 NOTE — Assessment & Plan Note (Signed)
Flare  Encouraged on smoking cesstation  She has multiple allergies - believes she can take cephlasporins , unsure if PCN allergy was told as a child mild reaction   Plan:  Omnicef 300mg  Twice daily  For  7 days  Mucinex DM Twice daily  As needed  Cough/congestion  Fluids and rest  Hydromet 1-2 tsp every 4-6 hr  As needed  Cough- may make you sleepy  Please contact office for sooner follow up if symptoms do not improve or worsen or seek emergency care

## 2011-09-22 NOTE — Patient Instructions (Signed)
Omnicef 300mg  Twice daily  For  7 days  Mucinex DM Twice daily  As needed  Cough/congestion  Fluids and rest  Hydromet 1-2 tsp every 4-6 hr  As needed  Cough- may make you sleepy  Please contact office for sooner follow up if symptoms do not improve or worsen or seek emergency care

## 2011-09-23 ENCOUNTER — Telehealth: Payer: Self-pay | Admitting: Internal Medicine

## 2011-09-23 MED ORDER — BISOPROLOL FUMARATE 5 MG PO TABS: 5.0000 mg | ORAL_TABLET | Freq: Every day | ORAL | Status: DC

## 2011-09-23 NOTE — Telephone Encounter (Signed)
Change to bisoprolol 5 mg one daily

## 2011-09-23 NOTE — Telephone Encounter (Signed)
I spoke with pt and is aware of the change. She had no questions

## 2011-09-23 NOTE — Telephone Encounter (Signed)
Per pt's insurance Bystolic is not covered. The preferred medications are:  Metoprolol, Carvedilol, Atenolol, Propranolol, Bisoprolol and Labetalol. Pls advise on switching the pt to one of the preferred medications. Member ID# [id number] P.

## 2011-09-25 ENCOUNTER — Encounter (HOSPITAL_COMMUNITY): Payer: Self-pay | Admitting: Pharmacy Technician

## 2011-09-30 ENCOUNTER — Encounter (HOSPITAL_COMMUNITY): Payer: Self-pay

## 2011-09-30 ENCOUNTER — Other Ambulatory Visit (HOSPITAL_COMMUNITY): Payer: Medicaid Other

## 2011-09-30 ENCOUNTER — Encounter (HOSPITAL_COMMUNITY)
Admission: RE | Admit: 2011-09-30 | Discharge: 2011-09-30 | Disposition: A | Discharge status: Home / Self Care | Payer: Medicaid Other | Source: Ambulatory Visit | Attending: Neurosurgery | Admitting: Neurosurgery

## 2011-09-30 LAB — CBC
HCT: 37.6 % (ref 36.0–46.0)
Hemoglobin: 12.7 g/dL (ref 12.0–15.0)
MCH: 27.2 pg (ref 26.0–34.0)
MCHC: 33.8 g/dL (ref 30.0–36.0)
MCV: 80.5 fL (ref 78.0–100.0)
Platelets: 162 10*3/uL (ref 150–400)
RBC: 4.67 MIL/uL (ref 3.87–5.11)
RDW: 14.1 % (ref 11.5–15.5)
WBC: 6.1 10*3/uL (ref 4.0–10.5)

## 2011-09-30 LAB — BASIC METABOLIC PANEL
BUN: 8 mg/dL (ref 6–23)
CO2: 29 mEq/L (ref 19–32)
Calcium: 9.3 mg/dL (ref 8.4–10.5)
Chloride: 104 mEq/L (ref 96–112)
Creatinine, Ser: 0.78 mg/dL (ref 0.50–1.10)
GFR calc Af Amer: >90 mL/min (ref >90)
GFR calc non Af Amer: 89 mL/min — ABNORMAL LOW (ref >90)
Glucose, Bld: 85 mg/dL (ref 70–99)
Potassium: 3.5 mEq/L (ref 3.5–5.1)
Sodium: 140 mEq/L (ref 135–145)

## 2011-09-30 LAB — SURGICAL PCR SCREEN
MRSA, PCR: NEGATIVE
Staphylococcus aureus: NEGATIVE

## 2011-09-30 NOTE — Pre-Procedure Instructions (Signed)
20 Denise Simmons  09/30/2011   Your procedure is scheduled on:  April 12  Report to [facility] at  0715  AM.  Call this number if you have problems the morning of surgery: [phone]   Remember:   Do not eat food:After Midnight.  May have clear liquids: up to 4 Hours before arrival.  Clear liquids include soda, tea, black coffee, apple or grape juice, broth.  Take these medicines the morning of surgery with A SIP OF WATER: bystolic,hydrocodone,nexium,nystatin   Do not wear jewelry, make-up or nail polish.  Do not wear lotions, powders, or perfumes. You may wear deodorant.  Do not shave 48 hours prior to surgery.  Do not bring valuables to the hospital.  Contacts, dentures or bridgework may not be worn into surgery.  Leave suitcase in the car. After surgery it may be brought to your room.  For patients admitted to the hospital, checkout time is 11:00 AM the day of discharge.   Patients discharged the day of surgery will not be allowed to drive home.  Name and phone number of your driver: friends  Special Instructions: CHG Shower Use Special Wash: 1/2 bottle night before surgery and 1/2 bottle morning of surgery.   Please read over the following fact sheets that you were given: Pain Booklet, Coughing and Deep Breathing, MRSA Information and Surgical Site Infection Prevention

## 2011-10-01 NOTE — Progress Notes (Signed)
Chart to Shonna Chock PA to review.

## 2011-10-02 NOTE — Consult Note (Addendum)
Anesthesia:  Patient is a 61 year old female scheduled for a right L4-5 foraminotomy with possible microdiscectomy on 10/09/11. History includes COPD, intolerance to ACEI therapy with refractory symptoms on ACEI (followed by Dr. Sherene Sires, but proved to have very minimal airflow obst by pft's 08/07/2011), smoking, RA, fibromyalgia, insomnia, HTN, Raynaud's disease.    Labs noted.  CXR on 06/19/11 showed no active cardiopulmonary disease.   EKG on 05/12/11 showed NSR, anterior T wave abnormality, consider ischemia.  Her PAT nurse called the patient today and there is no history of prior EKGs since she moved to [place] within the last year.  (She has moved several places over the past few years including [place], [place], [place], and [place], making past records difficult to locate and obtain.)  I called Ms. Anspach.  She denies CP, SOB at rest, edema. She is not very active due to back pain, fibromyalgia, and RA.  She feels her breathing is a baseline.  She has not had to use her nebulizer in three months.  She does report a history of a Cardiology work-up approximately 3 years ago in [place] that was done after what sounds like a syncopal episode.  She says tests were normal, and that she does not want to have a stress test ever again!  She does not remember who she saw for her Cardiology evaluation, but says her PCP, Dr. Cyndia Bent, at [facility] may have copies. Will request records as available.  Addendum: 10/01/11 1515  There are no cardiac records at her PCP office. I called [facility] in [place], and they do not have her listed as a patient.  They referred me to [facility] that is now owned by [facility].  [facility] referred me to [facility], but they would not confirm what had or hadn't been done there without a medical release of information.  I reviewed above with Anesthesiologist Dr. Michelle Piper, who feels she should have a Cardiology evaluation  pre-operatively if we are unable to get her past records to confirm she has had a negative cardiac work-up and/or that her EKG is stable.  I have left a message with the patient to see if she has been able to locate the name of the Cardiologist she had seen in the past and if she is able to come back into PAT to sign a medical release.  Graciella Belton at Dr. Fredrich Birks office was also updated.  I'll follow-up once I hear back for Ms. Esquivel.  Addendum: 10/05/11  1130  Patient contacted me this morning.  She is still unsure of the Cardiologist that she saw.  She was able to contact a prior PCP, Dr. Carlynn Purl (phone [phone], fax [phone]), whose office did have an old EKG from 02/18/09 that showed similar T wave changes/inversion in anterior leads that were also noted from her 05/12/11 EKG.  They did not have a copy of her prior stress test or echo.  I reviewed above with Anesthesiologist Dr. Sampson Goon.  Since her EKG is stable since August 2010, and she is without chest pain symptoms then anticipate she can proceed.

## 2011-10-08 MED ORDER — VANCOMYCIN HCL IN DEXTROSE 1-5 GM/200ML-% IV SOLN
1000.0000 mg | INTRAVENOUS | Status: AC
  Administered 2011-10-09: 1000 mg via INTRAVENOUS
  Filled 2011-10-08: qty 200

## 2011-10-09 ENCOUNTER — Encounter (HOSPITAL_COMMUNITY)
Admission: RE | Disposition: A | Discharge status: Home / Self Care | Payer: Self-pay | Source: Ambulatory Visit | Attending: Neurosurgery

## 2011-10-09 ENCOUNTER — Inpatient Hospital Stay (HOSPITAL_COMMUNITY)
Admission: RE | Admit: 2011-10-09 | Discharge: 2011-10-13 | DRG: 491 | Disposition: A | Discharge status: Home / Self Care | Payer: Medicaid Other | Source: Ambulatory Visit | Attending: Neurosurgery | Admitting: Neurosurgery

## 2011-10-09 ENCOUNTER — Encounter (HOSPITAL_COMMUNITY): Payer: Self-pay | Admitting: Vascular Surgery

## 2011-10-09 ENCOUNTER — Ambulatory Visit (HOSPITAL_COMMUNITY): Payer: Medicaid Other | Admitting: Vascular Surgery

## 2011-10-09 ENCOUNTER — Ambulatory Visit (HOSPITAL_COMMUNITY): Payer: Medicaid Other

## 2011-10-09 DIAGNOSIS — M47817 Spondylosis without myelopathy or radiculopathy, lumbosacral region: Secondary | ICD-10-CM | POA: Diagnosis present

## 2011-10-09 DIAGNOSIS — J4489 Other specified chronic obstructive pulmonary disease: Secondary | ICD-10-CM | POA: Diagnosis present

## 2011-10-09 DIAGNOSIS — M5126 Other intervertebral disc displacement, lumbar region: Principal | ICD-10-CM | POA: Diagnosis present

## 2011-10-09 DIAGNOSIS — I10 Essential (primary) hypertension: Secondary | ICD-10-CM | POA: Diagnosis present

## 2011-10-09 DIAGNOSIS — Z01812 Encounter for preprocedural laboratory examination: Secondary | ICD-10-CM

## 2011-10-09 DIAGNOSIS — IMO0001 Reserved for inherently not codable concepts without codable children: Secondary | ICD-10-CM | POA: Diagnosis present

## 2011-10-09 DIAGNOSIS — J449 Chronic obstructive pulmonary disease, unspecified: Secondary | ICD-10-CM | POA: Diagnosis present

## 2011-10-09 DIAGNOSIS — M129 Arthropathy, unspecified: Secondary | ICD-10-CM | POA: Diagnosis present

## 2011-10-09 DIAGNOSIS — M069 Rheumatoid arthritis, unspecified: Secondary | ICD-10-CM | POA: Diagnosis present

## 2011-10-09 HISTORY — PX: LUMBAR LAMINECTOMY/DECOMPRESSION MICRODISCECTOMY: SHX5026

## 2011-10-09 SURGERY — LUMBAR LAMINECTOMY/DECOMPRESSION MICRODISCECTOMY 1 LEVEL
Anesthesia: General | Laterality: Right | Wound class: Clean

## 2011-10-09 MED ORDER — BUPIVACAINE HCL (PF) 0.5 % IJ SOLN
INTRAMUSCULAR | Status: DC | PRN
  Administered 2011-10-09: 5 mL

## 2011-10-09 MED ORDER — PHENOL 1.4 % MT LIQD: 1.0000 | OROMUCOSAL | Status: DC | PRN

## 2011-10-09 MED ORDER — HYDROMORPHONE HCL PF 1 MG/ML IJ SOLN
0.2500 mg | INTRAMUSCULAR | Status: DC | PRN
  Administered 2011-10-09 (×3): 0.5 mg via INTRAVENOUS

## 2011-10-09 MED ORDER — ONDANSETRON HCL 4 MG/2ML IJ SOLN
4.0000 mg | INTRAMUSCULAR | Status: DC | PRN
Start: 2011-10-09 — End: 2011-10-13

## 2011-10-09 MED ORDER — ACETAMINOPHEN 325 MG PO TABS: 650.0000 mg | ORAL_TABLET | ORAL | Status: DC | PRN

## 2011-10-09 MED ORDER — LACTATED RINGERS IV SOLN
INTRAVENOUS | Status: DC | PRN
  Administered 2011-10-09 (×2): via INTRAVENOUS

## 2011-10-09 MED ORDER — ACETAMINOPHEN 650 MG RE SUPP: 650.0000 mg | RECTAL | Status: DC | PRN

## 2011-10-09 MED ORDER — DIAZEPAM 5 MG PO TABS
5.0000 mg | ORAL_TABLET | Freq: Four times a day (QID) | ORAL | Status: DC | PRN
Start: 2011-10-09 — End: 2011-10-13
  Administered 2011-10-09 – 2011-10-12 (×5): 5 mg via ORAL
  Filled 2011-10-09 (×5): qty 1

## 2011-10-09 MED ORDER — LIDOCAINE-EPINEPHRINE 1 %-1:100000 IJ SOLN
INTRAMUSCULAR | Status: DC | PRN
  Administered 2011-10-09: 5 mL

## 2011-10-09 MED ORDER — HYDROMORPHONE HCL PF 1 MG/ML IJ SOLN
INTRAMUSCULAR | Status: AC
  Filled 2011-10-09: qty 1

## 2011-10-09 MED ORDER — PROPOFOL 10 MG/ML IV EMUL
INTRAVENOUS | Status: DC | PRN
  Administered 2011-10-09: 80 mg via INTRAVENOUS

## 2011-10-09 MED ORDER — VANCOMYCIN HCL IN DEXTROSE 1-5 GM/200ML-% IV SOLN
1000.0000 mg | Freq: Two times a day (BID) | INTRAVENOUS | Status: AC
  Administered 2011-10-09: 1000 mg via INTRAVENOUS
  Filled 2011-10-09: qty 200

## 2011-10-09 MED ORDER — KCL IN DEXTROSE-NACL 20-5-0.45 MEQ/L-%-% IV SOLN
INTRAVENOUS | Status: DC
  Administered 2011-10-09: 15:00:00 via INTRAVENOUS
  Administered 2011-10-10: 75 mL/h via INTRAVENOUS
  Filled 2011-10-09 (×10): qty 1000

## 2011-10-09 MED ORDER — OXYCODONE HCL 5 MG PO TABS
2.5000 mg | ORAL_TABLET | ORAL | Status: DC | PRN
  Administered 2011-10-11 (×2): 2.5 mg via ORAL
  Filled 2011-10-09: qty 5
  Filled 2011-10-09: qty 2
  Filled 2011-10-09: qty 1

## 2011-10-09 MED ORDER — ROCURONIUM BROMIDE 100 MG/10ML IV SOLN
INTRAVENOUS | Status: DC | PRN
  Administered 2011-10-09: 50 mg via INTRAVENOUS

## 2011-10-09 MED ORDER — MORPHINE SULFATE 4 MG/ML IJ SOLN
1.0000 mg | INTRAMUSCULAR | Status: DC | PRN
  Administered 2011-10-09 – 2011-10-10 (×3): 4 mg via INTRAVENOUS
  Administered 2011-10-10: 2 mg via INTRAVENOUS
  Administered 2011-10-11 – 2011-10-12 (×3): 4 mg via INTRAVENOUS
  Filled 2011-10-09 (×8): qty 1

## 2011-10-09 MED ORDER — MORPHINE SULFATE 2 MG/ML IJ SOLN: 0.0500 mg/kg | INTRAMUSCULAR | Status: DC | PRN

## 2011-10-09 MED ORDER — OXYCODONE-ACETAMINOPHEN 5-325 MG PO TABS
1.0000 | ORAL_TABLET | ORAL | Status: DC | PRN
  Administered 2011-10-10: 1 via ORAL
  Filled 2011-10-09: qty 1
  Filled 2011-10-09: qty 2

## 2011-10-09 MED ORDER — OXYCODONE-ACETAMINOPHEN 7.5-500 MG PO TABS: 1.0000 | ORAL_TABLET | ORAL | Status: DC | PRN

## 2011-10-09 MED ORDER — SODIUM CHLORIDE 0.9 % IV SOLN
INTRAVENOUS | Status: AC
  Filled 2011-10-09: qty 500

## 2011-10-09 MED ORDER — BISOPROLOL FUMARATE 5 MG PO TABS: 5.0000 mg | ORAL_TABLET | Freq: Every day | ORAL | Status: DC

## 2011-10-09 MED ORDER — SODIUM CHLORIDE 0.9 % IR SOLN
Status: DC | PRN
  Administered 2011-10-09: 11:00:00

## 2011-10-09 MED ORDER — AMITRIPTYLINE HCL 75 MG PO TABS
150.0000 mg | ORAL_TABLET | Freq: Every day | ORAL | Status: DC
  Administered 2011-10-09 – 2011-10-12 (×4): 150 mg via ORAL
  Filled 2011-10-09 (×7): qty 2

## 2011-10-09 MED ORDER — MONTELUKAST SODIUM 10 MG PO TABS
10.0000 mg | ORAL_TABLET | Freq: Every day | ORAL | Status: DC
  Administered 2011-10-09 – 2011-10-12 (×4): 10 mg via ORAL
  Filled 2011-10-09 (×6): qty 1

## 2011-10-09 MED ORDER — NEOSTIGMINE METHYLSULFATE 1 MG/ML IJ SOLN
INTRAMUSCULAR | Status: DC | PRN
  Administered 2011-10-09: 5 mg via INTRAVENOUS

## 2011-10-09 MED ORDER — ONDANSETRON HCL 4 MG/2ML IJ SOLN
INTRAMUSCULAR | Status: DC | PRN
  Administered 2011-10-09: 4 mg via INTRAVENOUS

## 2011-10-09 MED ORDER — OXYCODONE-ACETAMINOPHEN 5-325 MG PO TABS
1.0000 | ORAL_TABLET | ORAL | Status: DC | PRN
  Administered 2011-10-09 – 2011-10-10 (×2): 2 via ORAL
  Administered 2011-10-10 (×2): 1 via ORAL
  Administered 2011-10-11: 2 via ORAL
  Administered 2011-10-11: 1 via ORAL
  Administered 2011-10-11 – 2011-10-13 (×7): 2 via ORAL
  Filled 2011-10-09 (×10): qty 2
  Filled 2011-10-09: qty 1
  Filled 2011-10-09: qty 2

## 2011-10-09 MED ORDER — ALBUTEROL SULFATE (5 MG/ML) 0.5% IN NEBU: 2.5000 mg | INHALATION_SOLUTION | Freq: Four times a day (QID) | RESPIRATORY_TRACT | Status: DC | PRN

## 2011-10-09 MED ORDER — MIDAZOLAM HCL 5 MG/5ML IJ SOLN
INTRAMUSCULAR | Status: DC | PRN
  Administered 2011-10-09 (×2): 1 mg via INTRAVENOUS

## 2011-10-09 MED ORDER — 0.9 % SODIUM CHLORIDE (POUR BTL) OPTIME
TOPICAL | Status: DC | PRN
  Administered 2011-10-09: 1000 mL

## 2011-10-09 MED ORDER — FENTANYL CITRATE 0.05 MG/ML IJ SOLN
INTRAMUSCULAR | Status: DC | PRN
  Administered 2011-10-09: 150 ug via INTRAVENOUS
  Administered 2011-10-09: 50 ug via INTRAVENOUS

## 2011-10-09 MED ORDER — HYDROCODONE-HOMATROPINE 5-1.5 MG/5ML PO SYRP: 5.0000 mL | ORAL_SOLUTION | Freq: Four times a day (QID) | ORAL | Status: DC | PRN

## 2011-10-09 MED ORDER — NYSTATIN 100000 UNIT/ML MT SUSP
500000.0000 [IU] | Freq: Four times a day (QID) | OROMUCOSAL | Status: DC
  Administered 2011-10-09 – 2011-10-13 (×13): 500000 [IU] via ORAL
  Filled 2011-10-09 (×19): qty 5

## 2011-10-09 MED ORDER — SODIUM CHLORIDE 0.9 % IJ SOLN
3.0000 mL | Freq: Two times a day (BID) | INTRAMUSCULAR | Status: DC
  Administered 2011-10-09 – 2011-10-13 (×6): 3 mL via INTRAVENOUS

## 2011-10-09 MED ORDER — BACITRACIN 50000 UNITS IM SOLR
INTRAMUSCULAR | Status: AC
  Filled 2011-10-09: qty 1

## 2011-10-09 MED ORDER — FLUTICASONE PROPIONATE 50 MCG/ACT NA SUSP
1.0000 | Freq: Every day | NASAL | Status: DC
  Administered 2011-10-12: 1 via NASAL
  Filled 2011-10-09: qty 16

## 2011-10-09 MED ORDER — CYCLOBENZAPRINE HCL 10 MG PO TABS
10.0000 mg | ORAL_TABLET | Freq: Three times a day (TID) | ORAL | Status: DC | PRN
  Administered 2011-10-11 – 2011-10-13 (×4): 10 mg via ORAL
  Filled 2011-10-09 (×4): qty 1

## 2011-10-09 MED ORDER — NEBIVOLOL HCL 5 MG PO TABS
5.0000 mg | ORAL_TABLET | Freq: Every day | ORAL | Status: DC
  Administered 2011-10-10 – 2011-10-13 (×4): 5 mg via ORAL
  Filled 2011-10-09 (×4): qty 1

## 2011-10-09 MED ORDER — ZOLPIDEM TARTRATE 10 MG PO TABS: 10.0000 mg | ORAL_TABLET | Freq: Every evening | ORAL | Status: DC | PRN

## 2011-10-09 MED ORDER — LORATADINE 10 MG PO TABS
10.0000 mg | ORAL_TABLET | Freq: Every day | ORAL | Status: DC
  Administered 2011-10-10 – 2011-10-13 (×4): 10 mg via ORAL
  Filled 2011-10-09 (×4): qty 1

## 2011-10-09 MED ORDER — GLYCOPYRROLATE 0.2 MG/ML IJ SOLN
INTRAMUSCULAR | Status: DC | PRN
  Administered 2011-10-09: .8 mg via INTRAVENOUS

## 2011-10-09 MED ORDER — MENTHOL 3 MG MT LOZG: 1.0000 | LOZENGE | OROMUCOSAL | Status: DC | PRN

## 2011-10-09 MED ORDER — ALBUTEROL SULFATE HFA 108 (90 BASE) MCG/ACT IN AERS
2.0000 | INHALATION_SPRAY | Freq: Four times a day (QID) | RESPIRATORY_TRACT | Status: DC | PRN
  Filled 2011-10-09: qty 6.7

## 2011-10-09 MED ORDER — SODIUM CHLORIDE 0.9 % IJ SOLN: 3.0000 mL | INTRAMUSCULAR | Status: DC | PRN

## 2011-10-09 MED ORDER — LIDOCAINE HCL (CARDIAC) 20 MG/ML IV SOLN
INTRAVENOUS | Status: DC | PRN
  Administered 2011-10-09: 100 mg via INTRAVENOUS

## 2011-10-09 MED ORDER — LACTATED RINGERS IV SOLN: INTRAVENOUS | Status: DC

## 2011-10-09 MED ORDER — DOCUSATE SODIUM 100 MG PO CAPS
100.0000 mg | ORAL_CAPSULE | Freq: Two times a day (BID) | ORAL | Status: DC
  Administered 2011-10-09 – 2011-10-13 (×7): 100 mg via ORAL
  Filled 2011-10-09 (×6): qty 1

## 2011-10-09 MED ORDER — HEMOSTATIC AGENTS (NO CHARGE) OPTIME
TOPICAL | Status: DC | PRN
  Administered 2011-10-09: 1 via TOPICAL

## 2011-10-09 MED ORDER — PANTOPRAZOLE SODIUM 40 MG PO TBEC
40.0000 mg | DELAYED_RELEASE_TABLET | Freq: Every day | ORAL | Status: DC
  Administered 2011-10-10 – 2011-10-13 (×4): 40 mg via ORAL
  Filled 2011-10-09 (×3): qty 1

## 2011-10-09 SURGICAL SUPPLY — 64 items
ADH SKN CLS APL DERMABOND .7 (GAUZE/BANDAGES/DRESSINGS) ×1
APL SKNCLS STERI-STRIP NONHPOA (GAUZE/BANDAGES/DRESSINGS) ×1
BAG DECANTER FOR FLEXI CONT (MISCELLANEOUS) ×2 IMPLANT
BENZOIN TINCTURE PRP APPL 2/3 (GAUZE/BANDAGES/DRESSINGS) ×2 IMPLANT
BIT DRILL NEURO 2X3.1 SFT TUCH (MISCELLANEOUS) ×1 IMPLANT
BLADE SURG ROTATE 9660 (MISCELLANEOUS) IMPLANT
BUR ROUND FLUTED 5 RND (BURR) ×2 IMPLANT
CANISTER SUCTION 2500CC (MISCELLANEOUS) ×2 IMPLANT
CLOTH BEACON ORANGE TIMEOUT ST (SAFETY) ×2 IMPLANT
CONT SPEC 4OZ CLIKSEAL STRL BL (MISCELLANEOUS) ×2 IMPLANT
DERMABOND ADVANCED (GAUZE/BANDAGES/DRESSINGS) ×1
DERMABOND ADVANCED .7 DNX12 (GAUZE/BANDAGES/DRESSINGS) ×1 IMPLANT
DRAPE LAPAROTOMY 100X72X124 (DRAPES) ×2 IMPLANT
DRAPE MICROSCOPE LEICA (MISCELLANEOUS) ×2 IMPLANT
DRAPE POUCH INSTRU U-SHP 10X18 (DRAPES) ×2 IMPLANT
DRAPE SURG 17X23 STRL (DRAPES) ×2 IMPLANT
DRESSING TELFA 8X3 (GAUZE/BANDAGES/DRESSINGS) IMPLANT
DRILL NEURO 2X3.1 SOFT TOUCH (MISCELLANEOUS) ×2
DURAPREP 26ML APPLICATOR (WOUND CARE) ×2 IMPLANT
ELECT REM PT RETURN 9FT ADLT (ELECTROSURGICAL) ×2
ELECTRODE REM PT RTRN 9FT ADLT (ELECTROSURGICAL) ×1 IMPLANT
GAUZE SPONGE 4X4 16PLY XRAY LF (GAUZE/BANDAGES/DRESSINGS) IMPLANT
GLOVE BIO SURGEON STRL SZ8 (GLOVE) IMPLANT
GLOVE BIOGEL PI IND STRL 6.5 (GLOVE) ×1 IMPLANT
GLOVE BIOGEL PI IND STRL 7.0 (GLOVE) ×1 IMPLANT
GLOVE BIOGEL PI IND STRL 7.5 (GLOVE) ×1 IMPLANT
GLOVE BIOGEL PI IND STRL 8 (GLOVE) ×2 IMPLANT
GLOVE BIOGEL PI IND STRL 8.5 (GLOVE) ×2 IMPLANT
GLOVE BIOGEL PI INDICATOR 6.5 (GLOVE) ×1
GLOVE BIOGEL PI INDICATOR 7.0 (GLOVE) ×1
GLOVE BIOGEL PI INDICATOR 7.5 (GLOVE) ×1
GLOVE BIOGEL PI INDICATOR 8 (GLOVE) ×2
GLOVE BIOGEL PI INDICATOR 8.5 (GLOVE) ×2
GLOVE ECLIPSE 8.0 STRL XLNG CF (GLOVE) IMPLANT
GLOVE EXAM NITRILE LRG STRL (GLOVE) IMPLANT
GLOVE EXAM NITRILE MD LF STRL (GLOVE) IMPLANT
GLOVE EXAM NITRILE XL STR (GLOVE) IMPLANT
GLOVE EXAM NITRILE XS STR PU (GLOVE) IMPLANT
GLOVE INDICATOR 7.0 STRL GRN (GLOVE) ×2 IMPLANT
GLOVE INDICATOR 7.5 STRL GRN (GLOVE) ×2 IMPLANT
GOWN BRE IMP SLV AUR LG STRL (GOWN DISPOSABLE) ×2 IMPLANT
GOWN BRE IMP SLV AUR XL STRL (GOWN DISPOSABLE) ×4 IMPLANT
GOWN STRL REIN 2XL LVL4 (GOWN DISPOSABLE) ×4 IMPLANT
KIT BASIN OR (CUSTOM PROCEDURE TRAY) ×2 IMPLANT
KIT ROOM TURNOVER OR (KITS) ×2 IMPLANT
NEEDLE HYPO 18GX1.5 BLUNT FILL (NEEDLE) IMPLANT
NEEDLE HYPO 25X1 1.5 SAFETY (NEEDLE) ×2 IMPLANT
NS IRRIG 1000ML POUR BTL (IV SOLUTION) ×2 IMPLANT
PACK LAMINECTOMY NEURO (CUSTOM PROCEDURE TRAY) ×2 IMPLANT
PAD ARMBOARD 7.5X6 YLW CONV (MISCELLANEOUS) ×6 IMPLANT
RUBBERBAND STERILE (MISCELLANEOUS) ×4 IMPLANT
SPONGE GAUZE 4X4 12PLY (GAUZE/BANDAGES/DRESSINGS) IMPLANT
SPONGE SURGIFOAM ABS GEL SZ50 (HEMOSTASIS) ×2 IMPLANT
STAPLER SKIN PROX WIDE 3.9 (STAPLE) IMPLANT
STRIP CLOSURE SKIN 1/2X4 (GAUZE/BANDAGES/DRESSINGS) IMPLANT
SUT VIC AB 0 CT1 18XCR BRD8 (SUTURE) ×1 IMPLANT
SUT VIC AB 0 CT1 8-18 (SUTURE) ×2
SUT VIC AB 2-0 CT1 18 (SUTURE) ×2 IMPLANT
SUT VIC AB 3-0 SH 8-18 (SUTURE) ×2 IMPLANT
SYR 20ML ECCENTRIC (SYRINGE) ×2 IMPLANT
SYR 5ML LL (SYRINGE) IMPLANT
TOWEL OR 17X24 6PK STRL BLUE (TOWEL DISPOSABLE) ×2 IMPLANT
TOWEL OR 17X26 10 PK STRL BLUE (TOWEL DISPOSABLE) ×2 IMPLANT
WATER STERILE IRR 1000ML POUR (IV SOLUTION) ×2 IMPLANT

## 2011-10-09 NOTE — Anesthesia Postprocedure Evaluation (Signed)
  Anesthesia Post-op Note  Patient: Denise Simmons  Procedure(s) Performed: Procedure(s) (LRB): LUMBAR LAMINECTOMY/DECOMPRESSION MICRODISCECTOMY 1 LEVEL (Right)  Patient Location: PACU  Anesthesia Type: General  Level of Consciousness: awake  Airway and Oxygen Therapy: Patient Spontanous Breathing  Post-op Pain: mild  Post-op Assessment: Post-op Vital signs reviewed  Post-op Vital Signs: Reviewed  Complications: No apparent anesthesia complications

## 2011-10-09 NOTE — H&P (Signed)
NEUROSURGICAL CONSULTATION  Kaitlyn Franko #[id number]  DOB:  1950-11-03  September 14, 2011  HISTORY:     Nadiyah Zeis is a 61 year old woman with a history of fibromyalgia and rheumatoid arthritis who is disabled who complains of pain in her entire back including her right buttock.  She has the urge to go to the bathroom because of this buttock pain. She says it has been going on progressively worsening over the last two years.  She has had an epidural steroid injection with no relief.  She has been taking Percocet 7.5/500 4-5 per day without relief and taking Flexeril 10 mg. b.i.d.  She has been to physical therapy and has had chiropractic treatment and she said this made her feel worse.  She has a history of hypertension, elevated cholesterol, chronic obstructive pulmonary disease, rheumatoid arthritis, sleep apnea and fibromyalgia.  She has had a previous tubal ligation and hemorrhoid surgery, hysterectomy in 1990's, and breast biopsy x 2 on the right in 1990, mouth surgery 08/2010.   She says she has pain near her left groin and abdomen and low back pain all day.  She has right leg pain with minimal left leg pain.   She has seen Dr. Marjory Lies who recommended neurosurgical consultation.    REVIEW OF SYSTEMS:   A detailed Review of Systems sheet was reviewed with the patient.  Pertinent positives include glasses, ear pain, ringing in ears, nasal congestion, sinus problems, sore throat, mouth sores, high blood pressure, high cholesterol, swelling in feet sometimes, leg pain while walking, chronic cough, chronic obstructive pulmonary disease, bronchitis, ulcers or gastritis, abdominal pain, change in bowel habits, arm weakness, leg weakness, back pain, joint pain or swelling, arthritis, neck pain, problems with coordination in  arms and/or leg, anxiety and depression.    PAST MEDICAL HISTORY:    . Current Medical Conditions:    Significant for rheumatoid arthritis, fibromyalgia, chronic obstructive  pulmonary disease, and asthma, hypertension, irritable bowel syndrome, hiatal hernia, peptic ulcer disease, osteoarthritis, and spinal herniated disc, with peripheral joint disorder with Raynaud's.     . Prior Operations and Hospitalizations:   As previously described.      . Medications and Allergies:  Humira 40 mg. every 2 months, Zyrtec 10 mg. q.a.m., Singulair 10 mg. nightly, Klor-Con 10 mg. b.i.d., Amitriptyline 10 mg. q.h.s., Nexium 40 mg. b.i.d., Topiramate 50 mg. for migraines, Cyclobenzaprine 10 mg. p.r.n., Percocet 7.5 4-5 a day, Bystolic 5 mg. q.d., Nasonex 50 mcg., Zolpidem 10 mg. q.h.s., and Proventil p.r.n. SHE IS ALLERGIC TO CELEBREX, NAPROSYN, DOXYCYCLINE, PREDNISONE AND SUMATRIPTAN.    Marland Kitchen Height and Weight:     5'7" tall, 186 pounds.     FAMILY HISTORY:    Positive in father for osteoarthritis and mother with osteoarthritis and rheumatoid arthritis      SOCIAL HISTORY:    She is a one pack per day smoker.  She denies alcohol or drug use.    DIAGNOSTIC STUDIES:   I reviewed a lumbar MRI which was performed through [facility] which shows a rightward posterior disc herniation at L4-5 with right lateral recess stenosis and moderately severe left foraminal stenosis.  There is disc bulging and facet arthropathy at L5-S1.  At L3-4, there is disc bulging with facet hypertrophy resulting in some moderate stenosis.    PHYSICAL EXAMINATION:    . General Appearance:   On examination today, Ms. Hice is a pleasant, cooperative, obese woman in no acute distress.    . Blood Pressure, Pulse:  140/84, heart rate 76 and regular, respirations 18.   Marland Kitchen HEENT - normocephalic, atraumatic.  The pupils are equal, round and reactive to light.  The extraocular muscles are intact.  Sclerae - white.  Conjunctiva - pink.  Oropharynx benign.  Uvula midline.   . Neck - there are no masses, meningismus, deformities, tracheal deviation, jugular vein distention or carotid bruits.  There  is normal cervical range of motion.  Spurlings' test is negative without reproducible radicular pain turning the patient's head to either side.  Lhermitte's sign is not present with axial compression.    Marland Kitchen Respiratory - there is normal respiratory effort with good intercostal function.  Lungs are clear to auscultation.  There are no rales, rhonchi or wheezes.     . Cardiovascular - the heart has regular rate and rhythm to auscultation.  No murmurs are appreciated.  There is no extremity edema, cyanosis or clubbing.  There are palpable pedal pulses.    . Abdomen - soft, nontender, no hepatosplenomegaly appreciated or masses.  There are active bowel sounds.  No guarding or rebound.    . Musculoskeletal Examination - she has right-sided sciatic notch discomfort with pain at the lumbosacral junction to palpation.  She is able to bend to within 16 inches of the floor with her upper extremities outstretched.  She is able to stand on her heels and toes and has a normal causal gait.  She has positive straight leg raise at 45 degrees on the right.    NEUROLOGICAL EXAMINATION: The patient is oriented to time, person and place and has good recall of both recent and remote memory with normal attention span and concentration.  The patient speaks with clear and fluent speech and exhibits normal language function and appropriate fund of knowledge.    . Cranial Nerve Examination - pupils are equal, round and reactive to light.  Extraocular movements are full.  Visual fields are full to confrontational testing.  Facial sensation and facial movement are symmetric and intact.  Hearing is intact to finger rub.  Palate is upgoing.  Shoulder shrug is symmetric.  Tongue protrudes in the midline.    . Motor Examination - motor strength is 5/5 in the bilateral deltoids, biceps, triceps, handgrips, wrist extensors, interosseous.  In the lower extremities motor strength is 5/5 in hip flexion, extension, quadriceps, hamstrings,  plantar flexion, dorsiflexion and extensor hallucis longus with the exception of 4/5 right hip abductor strength and 4/5 right extensor hallucis longus and dorsiflexion strength.    . Sensory Examination - she has increased pin prick sensation right L5 distribution.    . Deep Tendon Reflexes - 2 in the biceps, triceps, and brachioradialis, 1 at the right knee and 2 in the left knees, 2 in the ankles.  The great toes are downgoing to plantar stimulation.    . Cerebellar Examination - normal coordination in upper and lower extremities and normal rapid alternating movements.  Romberg test is negative.    IMPRESSION AND RECOMMENDATIONS: Kendre Sires is a 61 year old woman with right nerve root compression at L4-5 with disc herniation and spinal stenosis.  I have recommended that she work on stopping smoking and also given her significant weakness, I recommended she proceed with surgical intervention.  This would consist of a right L4-5 foraminotomy with decompression and possible microdiskectomy.  This has tentatively been  scheduled for 10/09/2011.  Risks and benefits were discussed in detail with the patient.  I reviewed the studies with the patient and went over  her physical examination.  I reviewed surgical models and discussed the typical hospital course and operative and postoperative course and the potential risks and benefits of surgery.  The risks of surgery were discussed in detail and include, but are not limited to, the risks of anesthesia, blood loss and the possibility of hemorrhage, infection, damage to nerves, damage to blood vessels, injury to the lumbar nerve root causing either temporary or permanent leg pain, numbness, weakness.  There is potential for spinal fluid leak from dural tear.  There is the potential for post-laminectomy spondylolisthesis, recurrent disc ruptured quoted at approximately 10%, failure to relieve pain, worsening of pain, need for further surgery.      [facility]    Danae Orleans. Venetia Maxon, M.D.  JDS:gde  cc: Dr. Joycelyn Schmid

## 2011-10-09 NOTE — Anesthesia Preprocedure Evaluation (Signed)
Anesthesia Evaluation    Airway Mallampati: II      Dental   Pulmonary COPD breath sounds clear to auscultation        Cardiovascular hypertension, Pt. on medications Rhythm:Regular Rate:Normal     Neuro/Psych    GI/Hepatic Neg liver ROS, PUD,   Endo/Other  negative endocrine ROS  Renal/GU negative Renal ROS     Musculoskeletal  (+) Arthritis -, Fibromyalgia -  Abdominal   Peds  Hematology negative hematology ROS (+)   Anesthesia Other Findings   Reproductive/Obstetrics                           Anesthesia Physical Anesthesia Plan  ASA: III  Anesthesia Plan: General   Post-op Pain Management:    Induction: Intravenous  Airway Management Planned: Oral ETT  Additional Equipment:   Intra-op Plan:   Post-operative Plan: Extubation in OR  Informed Consent: I have reviewed the patients History and Physical, chart, labs and discussed the procedure including the risks, benefits and alternatives for the proposed anesthesia with the patient or authorized representative who has indicated his/her understanding and acceptance.     Plan Discussed with: CRNA  Anesthesia Plan Comments:         Anesthesia Quick Evaluation

## 2011-10-09 NOTE — Anesthesia Procedure Notes (Signed)
Procedure Name: Intubation Date/Time: 10/09/2011 10:25 AM Performed by: Julianne Rice K Pre-anesthesia Checklist: Emergency Drugs available, Patient identified, Timeout performed, Suction available and Patient being monitored Patient Re-evaluated:Patient Re-evaluated prior to inductionOxygen Delivery Method: Circle system utilized Preoxygenation: Pre-oxygenation with 100% oxygen Intubation Type: IV induction Ventilation: Mask ventilation without difficulty Laryngoscope Size: Mac and 3 Grade View: Grade II Tube type: Oral Tube size: 8.0 mm Number of attempts: 1 Airway Equipment and Method: Stylet Placement Confirmation: ETT inserted through vocal cords under direct vision,  breath sounds checked- equal and bilateral and positive ETCO2 Secured at: 23 cm Tube secured with: Tape Dental Injury: Teeth and Oropharynx as per pre-operative assessment

## 2011-10-09 NOTE — Op Note (Signed)
10/09/2011  11:30 AM  PATIENT:  Denise Simmons  61 y.o. 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 L 4/5 Right  POST-OPERATIVE DIAGNOSIS:  lumbar herniated disc lumbar stenosis lumbar spondylois lumbar radiculopathy L 4/5 Right   PROCEDURE:  Procedure(s) (LRB): LUMBAR LAMINECTOMY/DECOMPRESSION MICRODISCECTOMY 1 LEVEL (Right) L 4/5  SURGEON:  Surgeon(s) and Role:    * Maeola Harman, MD - Primary    * Carmela Hurt, MD - Assisting  PHYSICIAN ASSISTANT:   ASSISTANTS: [place], RN   ANESTHESIA:   general  EBL:  Total I/O In: 1000 [I.V.:1000] Out: 50 [Blood:50]  BLOOD ADMINISTERED:none  DRAINS: none   LOCAL MEDICATIONS USED:  LIDOCAINE   SPECIMEN:  No Specimen  DISPOSITION OF SPECIMEN:  N/A  COUNTS:  YES  TOURNIQUET:  * No tourniquets in log *  DICTATION: DICTATION: Patient has a large L4-5 disc rupture on the right with significant right leg weakness. It was elected to take her to surgery for right L4-5 microdiscectomy.  Procedure: Patient was brought to the operating room and following the smooth and uncomplicated induction of general endotracheal anesthesia she was placed in a prone position on the Wilson frame. Low back was prepped and draped in the usual sterile fashion with DuraPrep. Area of planned incision was infiltrated with local lidocaine. Incision was made in the midline and carried to the lumbodorsal fascia which was incised on the right side of midline. Subperiosteal dissection was performed exposing what was felt to be L45 level. Intraoperative x-ray demonstrated marker probes at L5/S1and L4-5.  A hemi-semi-laminectomy of L4 was performed a high-speed drill and completed with Kerrison rongeurs and a generous foraminotomy was performed overlying the superior aspect of the L5 lamina. The microscope was brought into the field. Ligamentum flavum was detached and removed in a piecemeal fashion and  the L5 nerve root was decompressed laterally with removal of the superior aspect of the facet and ligamentum causing nerve root compression. The microscope was brought into the field and the L5 nerve root was mobilized medially. This exposed a spondylytic disc herniation.   Herniated disc material was removed. Multiple fragments were removed and these extended into the interspace which appeared to be quite soft with a disrupted annulus overlying the interspace. As a result it was elected to further decompress the interspace and remove loose disc material and this was done with a variety Epstein curettes and pituitary rongeurs. The redundant annulus was also removed with 2 mm Kerrison rongeur. At this point it was felt that all neural elements were well decompressed and there was no evidence of residual loose disc material within the interspace. The interspace was then irrigated with bacitracin saline and no additional disc material was mobilized. Hemostasis was assured with bipolar electrocautery. The lumbodorsal fascia was closed with 0 Vicryl sutures the subcutaneous tissues reapproximated 2-0 Vicryl inverted sutures and the skin edges were reapproximated with 3-0 Vicryl subcuticular stitch. The wound is dressed with Dermabond. Patient was extubated in the operating room and taken to recovery in stable and satisfactory condition having tolerated his operation well counts were correct at the end of the case.  PLAN OF CARE: Admit for overnight observation  PATIENT DISPOSITION:  PACU - hemodynamically stable.   Delay start of Pharmacological VTE agent (>24hrs) due to surgical blood loss or risk of bleeding: yes

## 2011-10-09 NOTE — Interval H&P Note (Signed)
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 After consideration of risks, benefits and other options for treatment, the patient has consented to  Procedure(s) (LRB): LUMBAR LAMINECTOMY/DECOMPRESSION MICRODISCECTOMY 1 LEVEL (Right) as a surgical intervention .  The patients' history has been reviewed, patient examined, no change in status, stable for surgery.  I have reviewed the patients' chart and labs.  Questions were answered to the patient's satisfaction.     Indianna Boran D  Date of Initial H&P: 09/14/2011  History reviewed, patient examined, no change in status, stable for surgery.

## 2011-10-09 NOTE — Transfer of Care (Signed)
Immediate Anesthesia Transfer of Care Note  Patient: Denise Simmons  Procedure(s) Performed: Procedure(s) (LRB): LUMBAR LAMINECTOMY/DECOMPRESSION MICRODISCECTOMY 1 LEVEL (Right)  Patient Location: PACU  Anesthesia Type: General  Level of Consciousness: awake, alert  and oriented  Airway & Oxygen Therapy: Patient Spontanous Breathing and Patient connected to nasal cannula oxygen  Post-op Assessment: Report given to PACU RN and Post -op Vital signs reviewed and stable  Post vital signs: Reviewed  Complications: No apparent anesthesia complications

## 2011-10-10 NOTE — Progress Notes (Signed)
Patient arrives to room via wheelchair from 3500.  Patient is able to transfer to bed from wheelchair. Pt. Is slightly sleepy but arousable.  Daughter is present at bedside with patient.  No complaints of discomfort verbalized at this time.

## 2011-10-10 NOTE — Progress Notes (Signed)
10/10/2011 Spoke with OT.  PT to attempt to check back later this PM as time and patient response allows.  Otherwise, PT will check back tomorrow to complete evaluation.   Rollene Rotunda Daylyn Christine, PT, DPT [phone]

## 2011-10-10 NOTE — Discharge Instructions (Signed)

## 2011-10-10 NOTE — Progress Notes (Signed)
PT/OT Cancellation Note  Evaluation cancelled today due to pt. soundly sleeping  and RN reports pt. has been very lethargic. Will re-attempt when pt. increases in arousal and is able to participate.    Thanks!  Daksh Coates, OTR/L Pager [phone] 10/10/2011, 1:28 PM

## 2011-10-10 NOTE — Plan of Care (Signed)
Problem: Phase I Progression Outcomes Goal: Pain controlled with appropriate interventions Outcome: Progressing Patient attempting to find post-surgical comfort with oral and IV pain medication. Goal: OOB as tolerated unless otherwise ordered Outcome: Not Progressing Patient has not worked with therapy yet; having some difficulty with decreased strength in lower extremities. Goal: Hemodynamically stable Outcome: Progressing Patient blood pressure runs with an elevated systolic pressure.  No adverse effects noted.

## 2011-10-10 NOTE — Progress Notes (Signed)
Patient ID: Denise Simmons, female   DOB: 1950-08-20, 61 y.o.   MRN: [id number] Subjective:  The patient is alert and pleasant. She is having difficulty with ambulation and wants to stay another day.  Objective: Vital signs in last 24 hours: Temp:  [96.8 F (36 C)-100.5 F (38.1 C)] 100.5 F (38.1 C) (04/13 0800) Pulse Rate:  [55-94] 75  (04/13 0800) Resp:  [12-26] 18  (04/13 0800) BP: (107-159)/(64-83) 131/64 mmHg (04/13 0800) SpO2:  [90 %-97 %] 93 % (04/13 0800) Weight:  [83.915 kg (185 lb)] 83.915 kg (185 lb) (04/12 0900)  Intake/Output from previous day: 04/12 0701 - 04/13 0700 In: 2080 [P.O.:480; I.V.:1600] Out: 50 [Blood:50] Intake/Output this shift:    Physical exam the patient is alert and oriented. She is moving her lower extremities well.  Lab Results: No results found for this basename: WBC:2,HGB:2,HCT:2,PLT:2 in the last 72 hours BMET No results found for this basename: NA:2,K:2,CL:2,CO2:2,GLUCOSE:2,BUN:2,CREATININE:2,CALCIUM:2 in the last 72 hours  Studies/Results: Dg Lumbar Spine 1 View  10/09/2011  *RADIOLOGY REPORT*  Clinical Data: Intraoperative localization for back surgery.  LUMBAR SPINE - 1 VIEW  Comparison: Lumbar spine series 09/14/2011.  Findings: Lateral lumbar spine film demonstrates surgical instruments marking the L5 vertebral body level and the L5-S1 disc space.  IMPRESSION: L5 and L5-S1 marked intraoperatively.  Original Report Authenticated By: P. Loralie Champagne, M.D.    Assessment/Plan: Postop day #1: I. will transfer to 3000 had PT and OT worked with her throughout the weekend.  LOS: 1 day     Luke Falero D 10/10/2011, 8:27 AM

## 2011-10-11 NOTE — Evaluation (Signed)
Physical Therapy Evaluation Patient Details Name: Denise Simmons MRN: [id number] DOB: 1951-03-18 Today's Date: 10/11/2011  Problem List:  Patient Active Problem List  Diagnoses  . COPD (chronic obstructive pulmonary disease)  . Hypertension  . Rheumatoid arthritis  . Smoker    Past Medical History:  Past Medical History  Diagnosis Date  . Arthritis   . COPD (chronic obstructive pulmonary disease)   . Fibromyalgia   . Hypertension   . Insomnia   . Allergic rhinitis   . Neck pain   . Raynaud's disease    Past Surgical History:  Past Surgical History  Procedure Date  . Abdominal hysterectomy   . Tubal ligation   . Hemorroidectomy   . Breast biopsy     PT Assessment/Plan/Recommendation PT Assessment Clinical Impression Statement:  61 year old s/p lumbar lam L4-5. Evaluation very limited by nerve like pain down pt's Rt. buttock and posterior Rt. thigh, 10/10 pain. Pt unable to tolerate significant ambulation, unable to tolerate sitting in chair today. Will benefit from acute and f/u PT to promote return to prior level of function.  PT Recommendation/Assessment: Patient will need skilled PT in the acute care venue PT Problem List: Decreased strength;Decreased activity tolerance;Decreased balance;Decreased mobility;Decreased knowledge of use of DME;Decreased knowledge of precautions;Pain Barriers to Discharge: None PT Therapy Diagnosis : Difficulty walking;Abnormality of gait;Generalized weakness;Acute pain PT Plan PT Frequency: Min 5X/week PT Treatment/Interventions: DME instruction;Gait training;Stair training;Functional mobility training;Therapeutic activities;Therapeutic exercise;Balance training;Patient/family education PT Recommendation Follow Up Recommendations: Home health PT;Supervision/Assistance - 24 hour (pending progress) Equipment Recommended: Rolling walker with 5" wheels;3 in 1 bedside comode PT Goals  Acute Rehab PT Goals PT Goal Formulation: With  patient Time For Goal Achievement: 7 days Pt will Roll Supine to Right Side: with modified independence PT Goal: Rolling Supine to Right Side - Progress: Goal set today Pt will Roll Supine to Left Side: with modified independence PT Goal: Rolling Supine to Left Side - Progress: Goal set today Pt will go Supine/Side to Sit: with modified independence PT Goal: Supine/Side to Sit - Progress: Goal set today Pt will go Sit to Supine/Side: with modified independence PT Goal: Sit to Supine/Side - Progress: Goal set today Pt will go Sit to Stand: with modified independence PT Goal: Sit to Stand - Progress: Goal set today Pt will go Stand to Sit: with modified independence PT Goal: Stand to Sit - Progress: Goal set today Pt will Ambulate: >150 feet;with supervision;with least restrictive assistive device PT Goal: Ambulate - Progress: Goal set today Pt will Go Up / Down Stairs: 1-2 stairs;with supervision;with least restrictive assistive device PT Goal: Up/Down Stairs - Progress: Goal set today  PT Evaluation Precautions/Restrictions  Precautions Precautions: Back Precaution Booklet Issued: Yes (comment) Required Braces or Orthoses:  (no orders for brace, no brace in room, pt reports of no brac) Restrictions Weight Bearing Restrictions: No Prior Functioning  Home Living Lives With: Alone Available Help at Discharge: Family;Friend(s);Available 24 hours/day Type of Home: Apartment Home Access: Stairs to enter (2) Entrance Stairs-Number of Steps: 2 Entrance Stairs-Rails: None Home Layout: One level Bathroom Shower/Tub: Forensic scientist: Standard Bathroom Accessibility: Yes How Accessible: Accessible via walker Home Adaptive Equipment: Tub transfer bench;Straight cane;Bedside commode/3-in-1 Prior Function Level of Independence: Independent Able to Take Stairs?: Yes Driving: Yes Vocation: On disability Cognition Cognition Arousal/Alertness: Awake/alert Overall  Cognitive Status: Appears within functional limits for tasks assessed Orientation Level: Oriented X4 Sensation/Coordination Sensation Light Touch: Appears Intact Stereognosis: Appears Intact Proprioception: Appears Intact Additional Comments: "  pain is so in my legs that I can't tell if they're numb" Coordination Gross Motor Movements are Fluid and Coordinated: Yes Fine Motor Movements are Fluid and Coordinated: Yes Extremity Assessment RUE Assessment RUE Assessment: Within Functional Limits LUE Assessment LUE Assessment: Within Functional Limits RLE Assessment RLE Assessment: Exceptions to [facility] RLE AROM (degrees) Overall AROM Right Lower Extremity: Within functional limits for tasks assessed RLE Strength RLE Overall Strength: Deficits RLE Overall Strength Comments: Weakness secondary to nerve pain per pt. Grossly >/= 3+/5 LLE Assessment LLE Assessment: Exceptions to WFL LLE AROM (degrees) Overall AROM Left Lower Extremity: Within functional limits for tasks assessed LLE Strength LLE Overall Strength: Deficits LLE Overall Strength Comments: Generalized deconditioning, grossly>/= 3+/5 Mobility (including Balance) Bed Mobility Bed Mobility: Yes Rolling Left: 4: Min assist Rolling Left Details (indicate cue type and reason): Verbal cues for log roll technique, flexion of Rt. LE, and use of railing to assist with roll. min assist for pain modulation Left Sidelying to Sit: 3: Mod assist;HOB flat Left Sidelying to Sit Details (indicate cue type and reason): Moderate assist for last aspect of transition. verbal cues for efficiency and pain modulation.  Supine to Sit: 3: Mod assist;HOB flat Supine to Sit Details (indicate cue type and reason): vc needed for maintaining back precautions Sitting - Scoot to Edge of Bed: 5: Supervision Sitting - Scoot to Edge of Bed Details (indicate cue type and reason): Verbal cues for initiation.  Transfers Transfers: Yes Sit to Stand: From bed;3: Mod  assist;From elevated surface;With upper extremity assist;1: +2 Total assist;Patient percentage (comment) (pt = 70%) Sit to Stand Details (indicate cue type and reason): moderate assist from bed, +2 assist from chair as pt practically jumping out of chair secondary to shooting nerve pain down Rt. LE.  Stand to Sit: 3: Mod assist;To bed;To chair/3-in-1 Stand to Sit Details: Assist for controlled descent. Verbal cues for UE placement.  Ambulation/Gait Ambulation/Gait: Yes Ambulation/Gait Assistance: 4: Min assist Ambulation/Gait Assistance Details (indicate cue type and reason): Close min assist, pt with limited weight acceptance onto Rt. LE although pt does not feel pain increases greatly during Rt. stance. Verbal cues to keep her eyes open with gait. Ambulation Distance (Feet): 25 Feet Assistive device: Rolling walker Gait Pattern: Step-to pattern;Decreased stance time - right;Decreased weight shift to right;Decreased step length - right;Decreased step length - left;Trunk flexed Stairs: No  Posture/Postural Control Posture/Postural Control: No significant limitations Balance Balance Assessed: Yes Static Sitting Balance Static Sitting - Balance Support: Bilateral upper extremity supported;Feet supported Static Sitting - Level of Assistance: 5: Stand by assistance Static Standing Balance Static Standing - Balance Support: Bilateral upper extremity supported Static Standing - Level of Assistance: 4: Min assist Static Standing - Comment/# of Minutes: Assist for balance when pt in excessive pain as she closes her eyes and has increased sway, otherwise is supervision. Exercise  Other Exercises Other Exercises: Pt not tolerating exercises today End of Session PT - End of Session Equipment Utilized During Treatment: Gait belt Activity Tolerance: Patient limited by pain Patient left: in bed;with call bell in reach;with bed alarm set Nurse Communication: Mobility status for ambulation;Mobility  status for transfers;Other (comment) (excessive pain) General Behavior During Session: [facility] for tasks performed Cognition: [facility] for tasks performed  Wilhemina Bonito 10/11/2011, 2:13 PM  Sherie Don) Carleene Mains PT, DPT Acute Rehabilitation [phone]

## 2011-10-11 NOTE — Progress Notes (Addendum)
Occupational Therapy Evaluation Patient Details Name: Denise Simmons MRN: [id number] DOB: 11-Mar-1951 Today's Date: 10/11/2011 Eval limited due to c/o pain/back spasms and pt requesting to return to bed.  Problem List:  Patient Active Problem List  Diagnoses  . COPD (chronic obstructive pulmonary disease)  . Hypertension  . Rheumatoid arthritis  . Smoker    Past Medical History:  Past Medical History  Diagnosis Date  . Arthritis   . COPD (chronic obstructive pulmonary disease)   . Fibromyalgia   . Hypertension   . Insomnia   . Allergic rhinitis   . Neck pain   . Raynaud's disease    Past Surgical History:  Past Surgical History  Procedure Date  . Abdominal hysterectomy   . Tubal ligation   . Hemorroidectomy   . Breast biopsy     OT Assessment/Plan/Recommendation OT Assessment Clinical Impression Statement: 60 s/p lumbar lam L4-5.Pt will benefit from skilled OT services to max indep with ADL and functional mobility prior to D/C secondary to deficits listed below. Pt will need HHOT after D/C. Pt plans to D/C to her own home with family to assist as needed. OT Recommendation/Assessment: Patient will need skilled OT in the acute care venue OT Problem List: Decreased strength;Decreased range of motion;Decreased activity tolerance;Decreased safety awareness;Decreased knowledge of use of DME or AE;Decreased knowledge of precautions;Pain Barriers to Discharge: None OT Therapy Diagnosis : Generalized weakness;Acute pain OT Plan OT Frequency: Min 2X/week OT Recommendation Follow Up Recommendations: Home health OT Equipment Recommended: Rolling walker with 5" wheels Individuals Consulted Consulted and Agree with Results and Recommendations: Patient OT Goals Acute Rehab OT Goals OT Goal Formulation: With patient Time For Goal Achievement: 7 days ADL Goals Pt Will Perform Grooming: with modified independence;Standing at sink ADL Goal: Grooming - Progress: Goal set  today Pt Will Perform Lower Body Bathing: with supervision;Unsupported;with adaptive equipment;with cueing (comment type and amount);Sit to stand from chair ADL Goal: Lower Body Bathing - Progress: Goal set today Pt Will Perform Lower Body Dressing: with set-up;with supervision;Unsupported;with adaptive equipment;with cueing (comment type and amount) ADL Goal: Lower Body Dressing - Progress: Goal set today Pt Will Transfer to Toilet: with modified independence;3-in-1;Maintaining back safety precautions;with DME ADL Goal: Toilet Transfer - Progress: Goal set today Pt Will Perform Toileting - Clothing Manipulation: with modified independence;Standing ADL Goal: Toileting - Clothing Manipulation - Progress: Goal set today Pt Will Perform Toileting - Hygiene: with modified independence;Sitting on 3-in-1 or toilet;Standing at 3-in-1/toilet;with adaptive equipment ADL Goal: Toileting - Hygiene - Progress: Goal set today Pt Will Perform Tub/Shower Transfer: with supervision;with DME;Transfer tub bench;Maintaining back safety precautions ADL Goal: Tub/Shower Transfer - Progress: Goal set today Additional ADL Goal #1: Pt will demonstrate back precautions during ADL independently. ADL Goal: Additional Goal #1 - Progress: Goal set today  OT Evaluation Precautions/Restrictions  Precautions Precautions: Back Precaution Booklet Issued: Yes (comment) Restrictions Weight Bearing Restrictions: No Prior Functioning Home Living Lives With: Alone Available Help at Discharge: Family;Friend(s);Available 24 hours/day Type of Home: Apartment Home Access: Stairs to enter (2) Entrance Stairs-Number of Steps: 2 Entrance Stairs-Rails: None Home Layout: One level Bathroom Shower/Tub: Forensic scientist: Standard Bathroom Accessibility: Yes How Accessible: Accessible via walker Home Adaptive Equipment: Tub transfer bench;Straight cane;Bedside commode/3-in-1 Prior Function Level of  Independence: Independent Able to Take Stairs?: Yes Driving: Yes Vocation: On disability  ADL ADL Eating/Feeding: Performed;Independent Where Assessed - Eating/Feeding: Edge of bed Grooming: Simulated;Set up Where Assessed - Grooming: Sitting, bed Upper Body Bathing: Simulated;Set up Where Assessed -  Upper Body Bathing: Sitting, bed Lower Body Bathing: Simulated;Maximal assistance Where Assessed - Lower Body Bathing: Sit to stand from bed;Unsupported Upper Body Dressing: Simulated;Set up Where Assessed - Upper Body Dressing: Sitting, bed;Supported Lower Body Dressing: Simulated;Maximal assistance Where Assessed - Lower Body Dressing: Sit to stand from bed Toilet Transfer: Not assessed;Other (comment) (pt unable to sit) Tub/Shower Transfer: Not assessed Ambulation Related to ADLs: Min A RW level. ADL Comments: Overall Max A for LB ADL. Vision/Perception  Vision - History Baseline Vision: Wears glasses only for reading Patient Visual Report: No change from baseline Vision - Assessment Eye Alignment: Within Functional Limits Vision Assessment: Vision not tested Cognition Cognition Arousal/Alertness: Awake/alert Overall Cognitive Status: Appears within functional limits for tasks assessed Orientation Level: Oriented X4 Sensation/Coordination Sensation Light Touch: Appears Intact Stereognosis: Appears Intact Proprioception: Appears Intact Additional Comments: "pain is so in my legs that I can't tell if they're numb" Coordination Gross Motor Movements are Fluid and Coordinated: Yes Fine Motor Movements are Fluid and Coordinated: Yes Extremity Assessment RUE Assessment RUE Assessment: Within Functional Limits LUE Assessment LUE Assessment: Within Functional Limits Mobility  Bed Mobility Bed Mobility: Yes Rolling Left: 5: Supervision Left Sidelying to Sit: 3: Mod assist;HOB flat Supine to Sit: 3: Mod assist;HOB flat Supine to Sit Details (indicate cue type and reason):  vc needed for maintaining back precautions Sitting - Scoot to Edge of Bed: 5: Supervision Transfers Transfers: Yes Sit to Stand: From bed;3: Mod assist;From elevated surface;With upper extremity assist;Other (comment) (vc for proper hand placement) Sit to Stand Details (indicate cue type and reason): +2 from chair. Pt very painful with c/o sciatica type pain shooting down R leg. Unable to tolerate seated position. Stand to Sit: 3: Mod assist;To bed;Other (comment) (controlled descent) Exercises   End of Session OT - End of Session Equipment Utilized During Treatment: Gait belt Activity Tolerance: Patient limited by pain Patient left: in bed;with call bell in reach Nurse Communication: Other (comment) (back pain) General Behavior During Session: [facility] for tasks performed Cognition: [facility] for tasks performed   [facility] 10/11/2011, 1:44 PM  [facility], OTR/L  [phone] 10/11/2011

## 2011-10-11 NOTE — Progress Notes (Signed)
Patient ID: Denise Simmons, female   DOB: 05/17/1951, 61 y.o.   MRN: [id number] Subjective:  The patient is alert and pleasant. She complains of back pain. He has barely ambulated. She is not ready to go home.  Objective: Vital signs in last 24 hours: Temp:  [97.3 F (36.3 C)-100.5 F (38.1 C)] 98.8 F (37.1 C) (04/14 0521) Pulse Rate:  [69-78] 69  (04/14 0521) Resp:  [18-20] 20  (04/14 0521) BP: (121-163)/(72-84) 153/83 mmHg (04/14 0521) SpO2:  [90 %-99 %] 91 % (04/14 0521)  Intake/Output from previous day:   Intake/Output this shift:    Physical exam the patient's incision is healing well without signs of infection. Her lower extremity strength is grossly normal except for giveaway weakness to pain.  Lab Results: No results found for this basename: WBC:2,HGB:2,HCT:2,PLT:2 in the last 72 hours BMET No results found for this basename: NA:2,K:2,CL:2,CO2:2,GLUCOSE:2,BUN:2,CREATININE:2,CALCIUM:2 in the last 72 hours  Studies/Results: Dg Lumbar Spine 1 View  10/09/2011  *RADIOLOGY REPORT*  Clinical Data: Intraoperative localization for back surgery.  LUMBAR SPINE - 1 VIEW  Comparison: Lumbar spine series 09/14/2011.  Findings: Lateral lumbar spine film demonstrates surgical instruments marking the L5 vertebral body level and the L5-S1 disc space.  IMPRESSION: L5 and L5-S1 marked intraoperatively.  Original Report Authenticated By: P. Loralie Champagne, M.D.    Assessment/Plan: Postop day #2: We will continue to mobilize her.  LOS: 2 days     Garrett Bowring D 10/11/2011, 9:47 AM

## 2011-10-12 ENCOUNTER — Encounter (HOSPITAL_COMMUNITY): Payer: Self-pay | Admitting: *Deleted

## 2011-10-12 MED ORDER — GABAPENTIN 300 MG PO CAPS
300.0000 mg | ORAL_CAPSULE | Freq: Three times a day (TID) | ORAL | Status: DC
  Administered 2011-10-12 – 2011-10-13 (×4): 300 mg via ORAL
  Filled 2011-10-12 (×6): qty 1

## 2011-10-12 MED ORDER — DEXAMETHASONE SODIUM PHOSPHATE 4 MG/ML IJ SOLN
4.0000 mg | Freq: Four times a day (QID) | INTRAMUSCULAR | Status: AC
  Administered 2011-10-12 – 2011-10-13 (×4): 4 mg via INTRAVENOUS
  Filled 2011-10-12 (×4): qty 1

## 2011-10-12 NOTE — Progress Notes (Signed)
Physical Therapy Treatment Patient Details Name: Denise Simmons MRN: [id number] DOB: 1950/07/15 Today's Date: 10/12/2011  PT Assessment/Plan  PT - Assessment/Plan Comments on Treatment Session: 61 y.o. female admitted to [facility] for LUMBAR LAMINECTOMY/DECOMPRESSION MICRODISCECTOMY 1 LEVEL.  The patient progressed much better this PM with her mobility compared to the AM.  She states that she still has 8/10 right hip pain, but the surgical and other pain she was experiencing this AM is gone.  She is much better able to mobilize and walk and should be able to go home tomorrow as planned if her pain continues to be this well controlled.   PT Plan: Discharge plan remains appropriate PT Frequency: Min 5X/week Follow Up Recommendations: Home health PT Equipment Recommended: Rolling walker with 5" wheels PT Goals  Acute Rehab PT Goals PT Goal: Rolling Supine to Left Side - Progress: Met PT Goal: Supine/Side to Sit - Progress: Met PT Goal: Sit to Supine/Side - Progress: Met PT Goal: Sit to Stand - Progress: Progressing toward goal PT Goal: Stand to Sit - Progress: Progressing toward goal PT Goal: Ambulate - Progress: Progressing toward goal Pt will Go Up / Down Stairs: with modified independence;1-2 stairs;with least restrictive assistive device PT Goal: Up/Down Stairs - Progress: Updated due to goal met  PT Treatment Precautions/Restrictions  Precautions Precautions: Back Precaution Booklet Issued: Yes (comment) Precaution Comments: verbally reviewed back precautions, patient able to report 3/3 precautions verbally.  Still needed some cues functionally to remind her that certain actions involved twisting.   Required Braces or Orthoses:  (no orders for brace, no brace in room, pt reporst no brace) Restrictions Weight Bearing Restrictions: No Mobility (including Balance) Bed Mobility Rolling Left: 6: Modified independent (Device/Increase time);With rail Left Sidelying to Sit: 6: Modified  independent (Device/Increase time);With rails Left Sidelying to Sit Details (indicate cue type and reason): mod assist to support trunk during painful transition Sitting - Scoot to Edge of Bed: 6: Modified independent (Device/Increase time) Sit to Supine: HOB flat;With rail;6: Modified independent (Device/Increase time) Transfers Sit to Stand: 5: Supervision;From bed;With upper extremity assist Sit to Stand Details (indicate cue type and reason): verbal cues for safe hand placment on bed, not to pull up on RW Stand to Sit: 5: Supervision;With armrests;To chair/3-in-1 Stand to Sit Details: supervision for safety Ambulation/Gait Ambulation/Gait: Yes Ambulation/Gait Assistance: 5: Supervision Ambulation/Gait Assistance Details (indicate cue type and reason): supervision for safety due to slow gait speed and heavy reliance on upper extremity support on RW.   Ambulation Distance (Feet): 140 Feet Assistive device: Rolling walker Gait Pattern: Step-through pattern;Trunk flexed Gait velocity: <1.8 ft/sec putting her at risk for recurrent falls.   Stairs: Yes Stairs Assistance: 5: Supervision Stairs Assistance Details (indicate cue type and reason): supervision for safety Stair Management Technique: Two rails;Alternating pattern;Forwards Number of Stairs: 10  Height of Stairs: 4  (4-6" stairs in [address] gym)    End of Session PT - End of Session Equipment Utilized During Treatment: Gait belt Activity Tolerance: Patient limited by pain Patient left: in chair (with OT ) General Behavior During Session: [facility] for tasks performed Cognition: [facility] for tasks performed  Xavius Spadafore B. Estus Krakowski, PT, DPT [phone] 10/12/2011, 3:36 PM

## 2011-10-12 NOTE — Progress Notes (Signed)
Subjective: Patient reports complains of right leg pain and buttock pain.  Objective: Vital signs in last 24 hours: Temp:  [98.1 F (36.7 C)-99.3 F (37.4 C)] 98.6 F (37 C) (04/15 0630) Pulse Rate:  [61-69] 65  (04/15 0630) Resp:  [18-20] 20  (04/15 0630) BP: (131-153)/(70-89) 153/89 mmHg (04/15 0630) SpO2:  [91 %-97 %] 97 % (04/15 0630) Weight:  [83.054 kg (183 lb 1.6 oz)] 83.054 kg (183 lb 1.6 oz) (04/15 0430)  Intake/Output from previous day:   Intake/Output this shift:    Physical Exam: Full strength on confrontational testing in lower extremities.  Dressing CDI.   Lab Results: No results found for this basename: WBC:2,HGB:2,HCT:2,PLT:2 in the last 72 hours BMET No results found for this basename: NA:2,K:2,CL:2,CO2:2,GLUCOSE:2,BUN:2,CREATININE:2,CALCIUM:2 in the last 72 hours  Studies/Results: No results found.  Assessment/Plan: Dexamethasone taper.  Neurontin.  Encourage mobility.  PT consult.    LOS: 3 days    Dorian Heckle, MD 10/12/2011, 7:09 AM

## 2011-10-12 NOTE — Progress Notes (Signed)
Occupational Therapy Treatment Patient Details Name: Denise Simmons MRN: [id number] DOB: 1950-10-21 Today's Date: 10/12/2011  OT Assessment/Plan OT Assessment/Plan Comments on Treatment Session: Pt making good progress. Would like to see i more time before D/C to review precautions with ADL and AE. OT Plan: Discharge plan remains appropriate OT Frequency: Min 2X/week Follow Up Recommendations: Home health OT Equipment Recommended: Rolling walker with 5" wheels;3 in 1 bedside comode OT Goals Acute Rehab OT Goals OT Goal Formulation: With patient Time For Goal Achievement: 7 days ADL Goals Pt Will Perform Grooming: with modified independence;Standing at sink ADL Goal: Grooming - Progress: Progressing toward goals Pt Will Perform Lower Body Bathing: with supervision;Unsupported;with adaptive equipment;with cueing (comment type and amount);Sit to stand from chair ADL Goal: Lower Body Bathing - Progress: Met Pt Will Perform Lower Body Dressing: with set-up;with supervision;Unsupported;with adaptive equipment;with cueing (comment type and amount) ADL Goal: Lower Body Dressing - Progress: Met Pt Will Transfer to Toilet: with modified independence;3-in-1;Maintaining back safety precautions;with DME ADL Goal: Toilet Transfer - Progress: Progressing toward goals Pt Will Perform Toileting - Clothing Manipulation: with modified independence;Standing ADL Goal: Toileting - Clothing Manipulation - Progress: Progressing toward goals Pt Will Perform Toileting - Hygiene: with modified independence;Sitting on 3-in-1 or toilet;Standing at 3-in-1/toilet;with adaptive equipment ADL Goal: Toileting - Hygiene - Progress: Met Pt Will Perform Tub/Shower Transfer: with supervision;with DME;Transfer tub bench;Maintaining back safety precautions ADL Goal: Tub/Shower Transfer - Progress: Met Additional ADL Goal #1: Pt will demonstrate back precautions during ADL independently. ADL Goal: Additional Goal #1 -  Progress: Progressing toward goals  OT Treatment Precautions/Restrictions  Precautions Precautions: Back Precaution Comments: verbally reviewed back precautions, patient able to report 3/3 precautions verbally.  Still needed some cues functionally to remind her that certain actions involved twisting.   Required Braces or Orthoses:  (no orders for brace, no brace in room, pt reporst no brace) Restrictions Weight Bearing Restrictions: No   ADL ADL Grooming: Performed;Supervision/safety Where Assessed - Grooming: Standing at sink (vc for following precautions) Lower Body Bathing: Simulated;Supervision/safety;Other (comment) (with long handles sponge) Where Assessed - Lower Body Bathing: Sit to stand from chair;Unsupported Lower Body Dressing: Performed;Supervision/safety;Set up;Other (comment) (with AE) Where Assessed - Lower Body Dressing: Sit to stand from chair;Unsupported Tub/Shower Transfer: Supervision/safety;Performed Tub/Shower Transfer Method: Science writer: Counsellor Used: Rolling walker;Reacher;Sock aid;Long-handled sponge Ambulation Related to ADLs: supervision RW ADL Comments: Improved performance with AE Mobility  Bed Mobility Bed Mobility: Yes Rolling Left: 6: Modified independent (Device/Increase time);With rail Left Sidelying to Sit: 6: Modified independent (Device/Increase time);With rails Sitting - Scoot to Edge of Bed: 6: Modified independent (Device/Increase time) Transfers Transfers: Yes Sit to Stand: 5: Supervision;From bed;With upper extremity assist Sit to Stand Details (indicate cue type and reason): verbal cues for safe hand placment on bed, not to pull up on RW Stand to Sit: 5: Supervision;With armrests;To chair/3-in-1 Stand to Sit Details: supervision for safety Exercises    End of Session OT - End of Session Equipment Utilized During Treatment: Gait belt Activity Tolerance: Patient tolerated treatment  well Patient left: in chair;with call bell in reach General Behavior During Session: [facility] for tasks performed Cognition: [facility] for tasks performed  Denise Simmons,Denise Simmons  10/12/2011, 4:45 PM [facility], OTR/L  [phone] 10/12/2011

## 2011-10-12 NOTE — Progress Notes (Signed)
Physical Therapy Treatment Patient Details Name: Denise Simmons MRN: [id number] DOB: 02-Nov-1950 Today's Date: 10/12/2011  PT Assessment/Plan  PT - Assessment/Plan Comments on Treatment Session: 61 y.o. female admitted to [facility] for LUMBAR LAMINECTOMY/DECOMPRESSION MICRODISCECTOMY 1 LEVEL.  She continues to be very limited by extreme post-op pain and did not walk as far today as she did yesterday.  PT will check back later this PM to see if we can progress gait and mobility further by working with OT.   PT Plan: Discharge plan remains appropriate;Frequency remains appropriate PT Frequency: Min 5X/week Follow Up Recommendations: Home health PT;Supervision/Assistance - 24 hour Equipment Recommended: Rolling walker with 5" wheels;3 in 1 bedside commode PT Goals  Acute Rehab PT Goals PT Goal: Rolling Supine to Left Side - Progress: Met PT Goal: Supine/Side to Sit - Progress: Progressing toward goal PT Goal: Sit to Supine/Side - Progress: Met PT Goal: Sit to Stand - Progress: Progressing toward goal PT Goal: Stand to Sit - Progress: Progressing toward goal PT Goal: Ambulate - Progress: Not progressing  PT Treatment Precautions/Restrictions  Precautions Precautions: Back Precaution Booklet Issued: Yes (comment) Required Braces or Orthoses:  (no orders for brace, no brace in room, pt reporst no brace) Restrictions Weight Bearing Restrictions: No Mobility (including Balance) Bed Mobility Rolling Left: 6: Modified independent (Device/Increase time);With rail Left Sidelying to Sit: 3: Mod assist;With rails;HOB flat Left Sidelying to Sit Details (indicate cue type and reason): mod assist to support trunk during painful transition Sitting - Scoot to Edge of Bed: 6: Modified independent (Device/Increase time) Sit to Supine: HOB flat;With rail;6: Modified independent (Device/Increase time) Transfers Sit to Stand: 1: +2 Total assist;With upper extremity assist;From bed Sit to Stand Details  (indicate cue type and reason): patient 75%, but with very flexed knees upon initally standing as if she was going to slide down to the floor onto her knees.   Stand to Sit: 4: Min assist;To bed;With upper extremity assist Stand to Sit Details: min assist to help patient lower slowly.   Ambulation/Gait Ambulation/Gait: Yes Ambulation/Gait Assistance: 4: Min assist Ambulation/Gait Assistance Details (indicate cue type and reason): min assist to help support trunk over weak and painful right leg.  The patient was unable to continue further due to right leg pain.   Ambulation Distance (Feet): 8 Feet Assistive device: Rolling walker Gait Pattern: Step-to pattern;Shuffle (heavy reliance on arms on RW) Gait velocity: <1.8 ft/sec putting her at risk for recurrent falls.      End of Session PT - End of Session Equipment Utilized During Treatment: Gait belt (RW) Activity Tolerance: Patient limited by pain Patient left: in bed;with call bell in reach;with family/visitor present General Behavior During Session: [facility] for tasks performed Cognition: [facility] for tasks performed  Theodoros Stjames B. Jaedon Siler, PT, DPT [phone] 10/12/2011, 1:01 PM

## 2011-10-13 NOTE — Progress Notes (Signed)
CARE MANAGEMENT NOTE 10/13/2011  Patient:  Denise Simmons, Denise Simmons   Account Number:  [id number]  Date Initiated:  10/13/2011  Documentation initiated by:  Vance Peper  Subjective/Objective Assessment:   61 yr old female adm for lumbar herniated disc     Action/Plan:   Spoke with patient regarding HH and DME needs. Medicaid will not cover HH PT, RN will need to go out to assess pt. Notified charge nurse.   Anticipated DC Date:  10/13/2011   Anticipated DC Plan:  HOME W HOME HEALTH SERVICES      DC Planning Services  CM consult      PAC Choice  DURABLE MEDICAL EQUIPMENT  HOME HEALTH   Choice offered to / List presented to:  C-1 Patient   DME arranged  Levan Hurst      DME agency  [facility]     [facility] arranged  HH-1 RN      [facility] agency  [facility]   Status of service:  Completed, signed off  Discharge Disposition:  HOME W HOME HEALTH SERVICES

## 2011-10-13 NOTE — Discharge Summary (Signed)
Physician Discharge Summary  Patient ID: Denise Simmons MRN: [id number] DOB/AGE: July 05, 1950 61 y.o.  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 *    Discharged Condition: good  Hospital Course: Naiomi Simmons is a 61 year old woman with a history of fibromyalgia and rheumatoid arthritis who is disabled who complains of pain in her entire back including her right buttock. She is admitted with a diagnosis of lumbar herniated disc lumbar stenosis lumbar spondylois lumbar radiculopathy. Following uncomplicated surgery and recovery in Neuro PACU, she was transferred to 3000 for nursing and therapy care.       Consults: None  Significant Diagnostic Studies: radiology: X-Ray: intra-operative  Treatments: surgery: LUMBAR LAMINECTOMY/DECOMPRESSION MICRODISCECTOMY 1 LEVEL (Right) L 4/5   Discharge Exam: Blood pressure 148/77, pulse 61, temperature 98.6 F (37 C), temperature source Oral, resp. rate 20, height 5\' 7"  (1.702 m), weight 83.054 kg (183 lb 1.6 oz), SpO2 95.00%. Alert, conversant; occasional pain to right thigh, much improved since Decadron. Good strength BLE. Incision with Dermabond, no erythema, swelling, or drainage.  Disposition: 01-Home or Self Care   Medication List  As of 10/13/2011  7:39 AM   ASK your doctor about these medications         albuterol 108 (90 BASE) MCG/ACT inhaler   Commonly known as: PROVENTIL HFA;VENTOLIN HFA   Inhale 2 puffs into the lungs 3 (three) times daily as needed. For shortness of breath      albuterol (2.5 MG/3ML) 0.083% nebulizer solution   Commonly known as: PROVENTIL   Take 2.5 mg  by nebulization every 6 (six) hours as needed. For wheezing      amitriptyline 150 MG tablet   Commonly known as: ELAVIL   Take 150 mg by mouth at bedtime.      bisoprolol 5 MG tablet   Commonly known as: ZEBETA   Take 5 mg by mouth daily.      cetirizine 5 MG tablet   Commonly known as: ZYRTEC   Take 5 mg by mouth daily.      cyclobenzaprine 10 MG tablet   Commonly known as: FLEXERIL   Take 10 mg by mouth 3 (three) times daily as needed. For spasms      esomeprazole 40 MG capsule   Commonly known as: NEXIUM   Take 40 mg by mouth 2 (two) times daily.      HUMIRA 40 MG/0.8ML injection   Generic drug: adalimumab   Inject 40 mg into the skin every 3 (three) months. 2-14 was last dose      HYDROcodone-homatropine 5-1.5 MG/5ML syrup   Commonly known as: HYCODAN   Take 5 mLs by mouth every 6 (six) hours as needed.      mometasone 50 MCG/ACT nasal spray   Commonly known as: NASONEX   Place 2 sprays into the nose daily.      montelukast 10 MG tablet   Commonly known as: SINGULAIR   Take 10 mg by mouth at bedtime.      nebivolol 5 MG tablet   Commonly known as: BYSTOLIC   Take 5 mg by mouth daily.      nystatin 100000 UNIT/ML suspension   Commonly known as: MYCOSTATIN   Take  500,000 Units by mouth 4 (four) times daily.      nystatin cream   Commonly known as: MYCOSTATIN   Apply 1 application topically at bedtime as needed. For sores      oxyCODONE-acetaminophen 7.5-500 MG per tablet   Commonly known as: PERCOCET   Take 1 tablet by mouth every 6 (six) hours as needed. Uses only brand/for pain      zolpidem 10 MG tablet   Commonly known as: AMBIEN   Take 10 mg by mouth at bedtime as needed. For sleep             Signed: Georgiann Cocker 10/13/2011, 7:39 AM

## 2011-10-13 NOTE — Progress Notes (Signed)
10/13/2011 Fredrich Birks PTA [phone] pager [phone] office

## 2011-10-13 NOTE — Progress Notes (Signed)
Occupational Therapy Treatment Patient Details Name: Denise Simmons MRN: [id number] DOB: 1950/07/22 Today's Date: 10/13/2011  OT Assessment/Plan OT Assessment/Plan Comments on Treatment Session: All goals met. [place] OT to continue with HHOT. OT Plan: Discharge plan remains appropriate OT Frequency: Min 2X/week Follow Up Recommendations: Home health OT Equipment Recommended: Rolling walker with 5" wheels OT Goals Acute Rehab OT Goals OT Goal Formulation: With patient Time For Goal Achievement: 7 days ADL Goals Pt Will Perform Grooming: with modified independence;Standing at sink ADL Goal: Grooming - Progress: Met Pt Will Perform Lower Body Bathing: with supervision;Unsupported;with adaptive equipment;with cueing (comment type and amount);Sit to stand from chair ADL Goal: Lower Body Bathing - Progress: Met Pt Will Perform Lower Body Dressing: with set-up;with supervision;Unsupported;with adaptive equipment;with cueing (comment type and amount) ADL Goal: Lower Body Dressing - Progress: Met Pt Will Transfer to Toilet: with modified independence;3-in-1;Maintaining back safety precautions;with DME ADL Goal: Toilet Transfer - Progress: Met Pt Will Perform Toileting - Clothing Manipulation: with modified independence;Standing ADL Goal: Toileting - Clothing Manipulation - Progress: Met Pt Will Perform Toileting - Hygiene: with modified independence;Sitting on 3-in-1 or toilet;Standing at 3-in-1/toilet;with adaptive equipment ADL Goal: Toileting - Hygiene - Progress: Met Pt Will Perform Tub/Shower Transfer: with supervision;with DME;Transfer tub bench;Maintaining back safety precautions ADL Goal: Tub/Shower Transfer - Progress: Met Additional ADL Goal #1: Pt will demonstrate back precautions during ADL independently. ADL Goal: Additional Goal #1 - Progress: Met  OT Treatment Precautions/Restrictions  Precautions Precautions: Back Precaution Comments: pt indep with back precautions   ADL ADL Ambulation Related to ADLs: mod I ADL Comments: Completing all ADL @ Mod I level. Issued AE due to inability to pay for items.      End of Session OT - End of Session Activity Tolerance: Patient tolerated treatment well Patient left: in bed;Other (comment) (EOB)  Bobak Oguinn,HILLARY  10/13/2011, 10:06 AM Luisa Dago, OTR/L  [phone] 10/13/2011

## 2011-10-13 NOTE — Progress Notes (Signed)
Physical Therapy Treatment Patient Details Name: Denise Simmons MRN: [id number] DOB: [birth year] Today's Date: 10/13/2011  PT Assessment/Plan  PT - Assessment/Plan Comments on Treatment Session: Pt was able to increase activity without increase back pain. Pt was able to amb stairs without LOB or unsteadiness. Pt was educated on proper activity level for overall health. Pt was also educated on compensatory strategies at home to maintain back precaustions   PT Plan: Discharge plan remains appropriate PT Frequency: Min 5X/week Follow Up Recommendations: Home health PT Equipment Recommended: Rolling walker with 5" wheels PT Goals  Acute Rehab PT Goals PT Goal: Rolling Supine to Left Side - Progress: Met PT Goal: Supine/Side to Sit - Progress: Met PT Goal: Sit to Supine/Side - Progress: Met PT Goal: Sit to Stand - Progress: Met PT Goal: Stand to Sit - Progress: Met PT Goal: Ambulate - Progress: Met PT Goal: Up/Down Stairs - Progress: Progressing toward goal  PT Treatment Precautions/Restrictions  Precautions Precautions: Back;Fall Precaution Booklet Issued: Yes (comment) Precaution Comments: Pt was able to recall 3/3 back percaustions  Required Braces or Orthoses:  (no orders for brace, no brace in room, pt reporst no brace) Restrictions Weight Bearing Restrictions: No Mobility (including Balance) Bed Mobility Rolling Left: With rail;6: Modified independent (Device/Increase time) Left Sidelying to Sit: With rails;6: Modified independent (Device/Increase time) Supine to Sit: HOB elevated (Comment degrees);6: Modified independent (Device/Increase time) Sitting - Scoot to Edge of Bed: 6: Modified independent (Device/Increase time) Transfers Transfers: Yes Sit to Stand: 6: Modified independent (Device/Increase time);From bed;With upper extremity assist Stand to Sit: 6: Modified independent (Device/Increase time);With upper extremity assist;To bed Ambulation/Gait Ambulation/Gait:  Yes Ambulation/Gait Assistance: 5: Supervision (supervision for safety ) Ambulation Distance (Feet): 250 Feet Assistive device: Rolling walker Gait Pattern: Decreased stride length;Step-through pattern Stairs: Yes Number of Stairs: 10  (5x2 in gym ) Wheelchair Mobility Wheelchair Mobility: No  End of Session PT - End of Session Equipment Utilized During Treatment: Gait belt Activity Tolerance: Patient tolerated treatment well Patient left: in bed;with call bell in reach;with bed alarm set Nurse Communication: Mobility status for transfers;Mobility status for ambulation General Behavior During Session: [facility] for tasks performed Cognition: [facility] for tasks performed  Tamera Stands 10/13/2011, 12:25 PM

## 2011-10-13 NOTE — Progress Notes (Signed)
Subjective: Patient reports "I feel better...a little pain in my thigh when it hurts."  Objective: Vital signs in last 24 hours: Temp:  [97.5 F (36.4 C)-98.6 F (37 C)] 98.6 F (37 C) (04/16 1610) Pulse Rate:  [61-84] 61  (04/16 0614) Resp:  [18-20] 20  (04/16 0614) BP: (113-148)/(66-77) 148/77 mmHg (04/16 0614) SpO2:  [92 %-96 %] 95 % (04/16 0614)  Intake/Output from previous day: 04/15 0701 - 04/16 0700 In: 1083 [P.O.:1080; I.V.:3] Out: -  Intake/Output this shift:    Alert, conversant. No pain at present...pt notes occas right thigh pain. Much improved since dexamethasone. Good strength BLE. Incision with Dermabond, no erythema, swelling, drainage.   Lab Results: No results found for this basename: WBC:2,HGB:2,HCT:2,PLT:2 in the last 72 hours BMET No results found for this basename: NA:2,K:2,CL:2,CO2:2,GLUCOSE:2,BUN:2,CREATININE:2,CALCIUM:2 in the last 72 hours  Studies/Results: No results found.  Assessment/Plan: Improving   LOS: 4 days  Plans to work with OT this am; hopeful of d/c later today or tomorrow.   Georgiann Cocker 10/13/2011, 7:33 AM

## 2011-10-27 ENCOUNTER — Other Ambulatory Visit: Payer: Self-pay | Admitting: *Deleted

## 2012-01-26 ENCOUNTER — Emergency Department (HOSPITAL_COMMUNITY)
Admission: EM | Admit: 2012-01-26 | Discharge: 2012-01-26 | Disposition: A | Discharge status: Home / Self Care | Payer: Medicaid Other | Attending: Emergency Medicine | Admitting: Emergency Medicine

## 2012-01-26 ENCOUNTER — Emergency Department (HOSPITAL_COMMUNITY): Payer: Medicaid Other

## 2012-01-26 ENCOUNTER — Encounter (HOSPITAL_COMMUNITY): Payer: Self-pay | Admitting: Cardiology

## 2012-01-26 DIAGNOSIS — M549 Dorsalgia, unspecified: Secondary | ICD-10-CM

## 2012-01-26 DIAGNOSIS — Z8739 Personal history of other diseases of the musculoskeletal system and connective tissue: Secondary | ICD-10-CM | POA: Insufficient documentation

## 2012-01-26 DIAGNOSIS — IMO0002 Reserved for concepts with insufficient information to code with codable children: Secondary | ICD-10-CM | POA: Insufficient documentation

## 2012-01-26 DIAGNOSIS — F17200 Nicotine dependence, unspecified, uncomplicated: Secondary | ICD-10-CM | POA: Insufficient documentation

## 2012-01-26 DIAGNOSIS — Z79899 Other long term (current) drug therapy: Secondary | ICD-10-CM | POA: Insufficient documentation

## 2012-01-26 DIAGNOSIS — J4489 Other specified chronic obstructive pulmonary disease: Secondary | ICD-10-CM | POA: Insufficient documentation

## 2012-01-26 DIAGNOSIS — M5416 Radiculopathy, lumbar region: Secondary | ICD-10-CM

## 2012-01-26 DIAGNOSIS — J449 Chronic obstructive pulmonary disease, unspecified: Secondary | ICD-10-CM | POA: Insufficient documentation

## 2012-01-26 DIAGNOSIS — I10 Essential (primary) hypertension: Secondary | ICD-10-CM | POA: Insufficient documentation

## 2012-01-26 LAB — POCT I-STAT, CHEM 8
BUN: 6 mg/dL (ref 6–23)
Calcium, Ion: 1.27 mmol/L (ref 1.13–1.30)
Chloride: 109 mEq/L (ref 96–112)
Creatinine, Ser: 0.9 mg/dL (ref 0.50–1.10)
Glucose, Bld: 86 mg/dL (ref 70–99)
HCT: 40 % (ref 36.0–46.0)
Hemoglobin: 13.6 g/dL (ref 12.0–15.0)
Potassium: 3.7 mEq/L (ref 3.5–5.1)
Sodium: 142 mEq/L (ref 135–145)
TCO2: 24 mmol/L (ref 0–100)

## 2012-01-26 LAB — URINALYSIS, ROUTINE W REFLEX MICROSCOPIC
Bilirubin Urine: NEGATIVE
Glucose, UA: NEGATIVE mg/dL
Hgb urine dipstick: NEGATIVE
Ketones, ur: NEGATIVE mg/dL
Leukocytes, UA: NEGATIVE
Nitrite: NEGATIVE
Protein, ur: NEGATIVE mg/dL
Specific Gravity, Urine: 1.02 (ref 1.005–1.030)
Urobilinogen, UA: 0.2 mg/dL (ref 0.0–1.0)
pH: 5.5 (ref 5.0–8.0)

## 2012-01-26 MED ORDER — DEXAMETHASONE SODIUM PHOSPHATE 10 MG/ML IJ SOLN
10.0000 mg | Freq: Once | INTRAMUSCULAR | Status: AC
Start: 2012-01-26 — End: 2012-01-26
  Administered 2012-01-26: 10 mg via INTRAVENOUS
  Filled 2012-01-26: qty 1

## 2012-01-26 MED ORDER — HYDROMORPHONE HCL PF 1 MG/ML IJ SOLN
0.5000 mg | Freq: Once | INTRAMUSCULAR | Status: AC
  Administered 2012-01-26: 16:00:00 via INTRAVENOUS
  Filled 2012-01-26: qty 1

## 2012-01-26 MED ORDER — ONDANSETRON HCL 4 MG/2ML IJ SOLN
4.0000 mg | Freq: Once | INTRAMUSCULAR | Status: AC
  Administered 2012-01-26: 4 mg via INTRAVENOUS
  Filled 2012-01-26: qty 2

## 2012-01-26 MED ORDER — DIAZEPAM 5 MG/ML IJ SOLN
2.5000 mg | Freq: Once | INTRAMUSCULAR | Status: AC
  Administered 2012-01-26: 2.5 mg via INTRAVENOUS
  Filled 2012-01-26: qty 2

## 2012-01-26 MED ORDER — HYDROMORPHONE HCL PF 1 MG/ML IJ SOLN
1.0000 mg | Freq: Once | INTRAMUSCULAR | Status: AC
  Administered 2012-01-26: 1 mg via INTRAVENOUS
  Filled 2012-01-26: qty 1

## 2012-01-26 NOTE — ED Provider Notes (Signed)
Pt seen and examined by me in CDU. Pt with lower back pain, hx of laminectomy in April, here with increased pain over last few days. Pain lower back radiating into left buttocks and leg. Pain medciations not helping. Pt states she is also achy all over, coughing.    Exam:  Pt in NAD. VS normal. Lungs clear to auscultation bilat. Regular Hr and rhythm. Tender to palpation over bilateral lower back and left buttocks. Pain with straight left leg raise. Neuro muscularly intact, 5/5 and equal LE strength, no saddle anesthesia.   Pt in CDU awaiting labs and x-ray results, pain management.    Results for orders placed during the hospital encounter of 01/26/12  URINALYSIS, ROUTINE W REFLEX MICROSCOPIC      Component Value Range   Color, Urine YELLOW  YELLOW   APPearance HAZY (*) CLEAR   Specific Gravity, Urine 1.020  1.005 - 1.030   pH 5.5  5.0 - 8.0   Glucose, UA NEGATIVE  NEGATIVE mg/dL   Hgb urine dipstick NEGATIVE  NEGATIVE   Bilirubin Urine NEGATIVE  NEGATIVE   Ketones, ur NEGATIVE  NEGATIVE mg/dL   Protein, ur NEGATIVE  NEGATIVE mg/dL   Urobilinogen, UA 0.2  0.0 - 1.0 mg/dL   Nitrite NEGATIVE  NEGATIVE   Leukocytes, UA NEGATIVE  NEGATIVE  POCT I-STAT, CHEM 8      Component Value Range   Sodium 142  135 - 145 mEq/L   Potassium 3.7  3.5 - 5.1 mEq/L   Chloride 109  96 - 112 mEq/L   BUN 6  6 - 23 mg/dL   Creatinine, Ser 1.61  0.50 - 1.10 mg/dL   Glucose, Bld 86  70 - 99 mg/dL   Calcium, Ion 0.96  0.45 - 1.30 mmol/L   TCO2 24  0 - 100 mmol/L   Hemoglobin 13.6  12.0 - 15.0 g/dL   HCT 40.9  81.1 - 91.4 %   Dg Chest 2 View  01/26/2012  *RADIOLOGY REPORT*  Clinical Data: Productive cough  CHEST - 2 VIEW  Comparison: 03/15/2011  Findings: The lungs are clear without focal infiltrate, edema, pneumothorax or pleural effusion.  Calcified granuloma in the left midlung is stable. Cardiopericardial silhouette is at upper limits of normal for size. Imaged bony structures of the thorax are intact.   IMPRESSION: Stable.  No acute cardiopulmonary process.  Original Report Authenticated By: ERIC A. MANSELL, M.D.   Dg Lumbar Spine Complete  01/26/2012  *RADIOLOGY REPORT*  Clinical Data: Back pain.  LUMBAR SPINE - COMPLETE 4+ VIEW  Comparison: 09/14/2011.  Findings: Alignment is anatomic.  Vertebral body height is maintained.  Endplate degenerative changes, loss of disc space height and facet hypertrophy are worst at L4-5 and L5-S1. Prominent facet hypertrophy at L3-4.  No definite pars defects. Atherosclerotic calcification of the arterial vasculature.  IMPRESSION: Spondylosis, worst at L4-5 and L5-S1.  Original Report Authenticated By: Reyes Ivan, M.D.    3:52 PM Pt feeling better with pain medications. Wants to go home. Will d/c home with follow up with her doctor. Her x-rays, labs, UA unremarkable. She has pain medications at home and muscle relaxants. She was able to ambulate to the bathroom. She has no neurodeficits and has no red flags to suggest cauda equina. VS normal   Filed Vitals:   01/26/12 1245  BP: 120/75  Pulse: 61  Temp:   Resp: 20   Afebrile at 97.2deg F. Will d/c home.   Lottie Mussel, PA 01/26/12  1553 

## 2012-01-26 NOTE — ED Notes (Signed)
Meal ordered

## 2012-01-26 NOTE — ED Notes (Signed)
Reports she had back surgery in April and still having lower back pain. Also reports pain in her arms and neck. C/o recent productive cough.

## 2012-01-26 NOTE — ED Provider Notes (Signed)
History     CSN: [id number]  Arrival date & time 01/26/12  1041   First MD Initiated Contact with Patient 01/26/12 1056      Chief Complaint  Patient presents with  . Pain all over   . Back Pain    (Consider location/radiation/quality/duration/timing/severity/associated sxs/prior treatment) Patient is a 61 y.o. female presenting with back pain. The history is provided by the patient.  Back Pain  This is a recurrent problem. Episode onset: 6 days ago. The problem occurs constantly. The problem has been gradually worsening. The pain is associated with no known injury. The pain is present in the lumbar spine. The quality of the pain is described as stabbing and shooting. The pain radiates to the left thigh. The pain is at a severity of 10/10. The pain is severe. The symptoms are aggravated by bending, twisting and certain positions. The pain is the same all the time. Stiffness is present all day. Associated symptoms include leg pain. Pertinent negatives include no chest pain, no fever, no numbness, no dysuria, no pelvic pain, no paresthesias, no paresis, no tingling and no weakness. She has tried analgesics and muscle relaxants for the symptoms. The treatment provided no relief.    Past Medical History  Diagnosis Date  . Arthritis   . COPD (chronic obstructive pulmonary disease)   . Fibromyalgia   . Hypertension   . Insomnia   . Allergic rhinitis   . Neck pain   . Raynaud's disease     Past Surgical History  Procedure Date  . Abdominal hysterectomy   . Tubal ligation   . Hemorroidectomy   . Breast biopsy   . Lumbar laminectomy/decompression microdiscectomy 10/09/2011    Procedure: LUMBAR LAMINECTOMY/DECOMPRESSION MICRODISCECTOMY 1 LEVEL;  Surgeon: Maeola Harman, MD;  Location: [facility];  Service: Neurosurgery;  Laterality: Right;  RIGHT Lumbar four-five foraminotomy with possible microdiskectomy    Family History  Problem Relation Age of Onset  . Rheum arthritis Mother     . Breast cancer Mother   . Breast cancer Sister   . Ovarian cancer Maternal Grandmother   . Rectal cancer Paternal Grandmother     History  Substance Use Topics  . Smoking status: Current Everyday Smoker -- 1.0 packs/day for 25 years    Types: Cigarettes  . Smokeless tobacco: Never Used  . Alcohol Use: No     Quit in 2001    OB History    Grav Para Term Preterm Abortions TAB SAB Ect Mult Living                  Review of Systems  Constitutional: Negative for fever.  Respiratory: Positive for cough. Negative for shortness of breath.        The cough for the last week  Cardiovascular: Negative for chest pain.  Genitourinary: Negative for dysuria and pelvic pain.  Musculoskeletal: Positive for back pain.  Neurological: Negative for tingling, weakness, numbness and paresthesias.  All other systems reviewed and are negative.    Allergies  Prednisone; Celebrex; Doxycycline; Fish allergy; Lisinopril; Methotrexate derivatives; Naproxen; Nsaids; Sumatriptan; Latex; Penicillins; and Sulfa antibiotics  Home Medications   Current Outpatient Rx  Name Route Sig Dispense Refill  . ALBUTEROL SULFATE HFA 108 (90 BASE) MCG/ACT IN AERS Inhalation Inhale 2 puffs into the lungs 3 (three) times daily as needed. For shortness of breath     . ALLOPURINOL 100 MG PO TABS Oral Take 100 mg by mouth daily.    Marland Kitchen AMITRIPTYLINE HCL  150 MG PO TABS Oral Take 150 mg by mouth at bedtime.      Marland Kitchen BISOPROLOL FUMARATE 5 MG PO TABS Oral Take 5 mg by mouth daily.    Marland Kitchen CETIRIZINE HCL 5 MG PO TABS Oral Take 5 mg by mouth daily.    . COLCHICINE 0.6 MG PO TABS Oral Take 0.6 mg by mouth daily.    . CYCLOBENZAPRINE HCL 10 MG PO TABS Oral Take 10 mg by mouth 3 (three) times daily as needed. For spasms    . ESOMEPRAZOLE MAGNESIUM 40 MG PO CPDR Oral Take 40 mg by mouth 2 (two) times daily.      Marland Kitchen MONTELUKAST SODIUM 10 MG PO TABS Oral Take 10 mg by mouth at bedtime.      . NYSTATIN 100000 UNIT/GM EX CREA Topical  Apply 1 application topically at bedtime as needed. For sores    . OXYCODONE-ACETAMINOPHEN 7.5-500 MG PO TABS Oral Take 1.5 tablets by mouth every 6 (six) hours as needed. For pain    . POLYETHYLENE GLYCOL 3350 PO PACK Oral Take 17 g by mouth 2 (two) times daily.    . TOPIRAMATE 50 MG PO TABS Oral Take 50 mg by mouth 2 (two) times daily.    Marland Kitchen ZOLPIDEM TARTRATE 10 MG PO TABS Oral Take 10 mg by mouth at bedtime as needed. For sleep       BP 141/85  Pulse 71  Temp 97.2 F (36.2 C) (Oral)  Resp 20  SpO2 95%  Physical Exam  Nursing note and vitals reviewed. Constitutional: She is oriented to person, place, and time. She appears well-developed and well-nourished. She appears distressed.  HENT:  Head: Normocephalic and atraumatic.  Mouth/Throat: Oropharynx is clear and moist.  Eyes: EOM are normal. Pupils are equal, round, and reactive to light.  Neck: Normal range of motion. Neck supple. No spinous process tenderness and no muscular tenderness present. No rigidity. Normal range of motion present.  Cardiovascular: Normal rate, regular rhythm, normal heart sounds and intact distal pulses.   No murmur heard. Pulmonary/Chest: Effort normal and breath sounds normal. She has no wheezes. She has no rales.  Abdominal: Soft. She exhibits no distension. There is no tenderness. There is no CVA tenderness.  Musculoskeletal: She exhibits no tenderness.       Lumbar back: She exhibits decreased range of motion, tenderness and pain. She exhibits no swelling, no deformity and normal pulse.  Neurological: She is alert and oriented to person, place, and time. Coordination normal.  Reflex Scores:      Patellar reflexes are 1+ on the right side and 1+ on the left side. Skin: Skin is warm and dry. No rash noted.  Psychiatric: She has a normal mood and affect.    ED Course  Procedures (including critical care time)   Labs Reviewed  URINALYSIS, ROUTINE W REFLEX MICROSCOPIC   No results found.   No  diagnosis found.    MDM   Pt with gradual onset of back pain suggestive of radiculopathy.  Prior hx of back surgery without any current injury.  Pt states now instead of right sciatic type sx now she has left.  No neurovascular compromise and no incontinence.  Pt has no infectious sx, hx of CA  or other red flags concerning for pathologic back pain.  Pt is able to ambulate but is painful.  Normal strength and reflexes on exam.  Denies trauma.  Pt states taking her pain meds but pain is not controlled.  Lumbar films penidng and UA.  Also she is c/o of productive cough for the last week with wheezing and smoking hx with hx of bronchitis.  No wheezing or tachypnea on exam.  CXR pending.  Pt given pain control.  Pt moved to CDU      Gwyneth Sprout, MD 01/26/12 1217

## 2012-01-26 NOTE — ED Provider Notes (Signed)
Medical screening examination/treatment/procedure(s) were conducted as a shared visit with non-physician practitioner(s) and myself.  I personally evaluated the patient during the encounter   Gwyneth Sprout, MD 01/26/12 1605

## 2012-03-11 ENCOUNTER — Other Ambulatory Visit: Payer: Self-pay | Admitting: Neurosurgery

## 2012-03-11 DIAGNOSIS — M5126 Other intervertebral disc displacement, lumbar region: Secondary | ICD-10-CM

## 2012-03-17 ENCOUNTER — Ambulatory Visit
Admission: RE | Admit: 2012-03-17 | Discharge: 2012-03-17 | Disposition: A | Discharge status: Home / Self Care | Payer: Medicaid Other | Source: Ambulatory Visit | Attending: Neurosurgery | Admitting: Neurosurgery

## 2012-03-17 DIAGNOSIS — M5126 Other intervertebral disc displacement, lumbar region: Secondary | ICD-10-CM

## 2012-03-17 MED ORDER — GADOBENATE DIMEGLUMINE 529 MG/ML IV SOLN
16.0000 mL | Freq: Once | INTRAVENOUS | Status: AC | PRN
  Administered 2012-03-17: 16 mL via INTRAVENOUS

## 2012-03-30 ENCOUNTER — Other Ambulatory Visit: Payer: Self-pay | Admitting: Family Medicine

## 2012-03-30 DIAGNOSIS — Z803 Family history of malignant neoplasm of breast: Secondary | ICD-10-CM

## 2012-03-30 DIAGNOSIS — Z1231 Encounter for screening mammogram for malignant neoplasm of breast: Secondary | ICD-10-CM

## 2012-04-05 ENCOUNTER — Encounter: Payer: Self-pay | Admitting: Internal Medicine

## 2012-04-05 ENCOUNTER — Ambulatory Visit (INDEPENDENT_AMBULATORY_CARE_PROVIDER_SITE_OTHER): Payer: Medicaid Other | Admitting: Internal Medicine

## 2012-04-05 VITALS — BP 122/78 | HR 63 | Temp 98.3°F | Ht 67.0 in | Wt 192.8 lb

## 2012-04-05 DIAGNOSIS — F17200 Nicotine dependence, unspecified, uncomplicated: Secondary | ICD-10-CM

## 2012-04-05 DIAGNOSIS — J449 Chronic obstructive pulmonary disease, unspecified: Secondary | ICD-10-CM

## 2012-04-05 MED ORDER — DOXYCYCLINE HYCLATE 100 MG PO TABS: 100.0000 mg | ORAL_TABLET | Freq: Two times a day (BID) | ORAL | Status: DC

## 2012-04-05 NOTE — Progress Notes (Signed)
Subjective:    Patient ID: Denise Simmons, female    DOB: 1950/08/25    MRN: [id number]    Brief patient profile:  61 yobf still smoker with COPD and refractory symptoms on ACEI when first seen in pulmonary clinic in 2012 but proved to have very minimal airflow obst by pft's 08/07/2011   HPI 06/19/2011 1st pulmonary cc worse sob  since Oct 30th 2012 when moved to [place] cc sorethroat, trouble swallowing s/p dilation on ACEI cough severe, mostly daytime and dry, comes and goes,  right now better,  Sometimes vomiting. mutiple nebs and inhalers not really helping but the cough med helps the most with her breathing which is better as long as cough is controlled Has RA but  States ok control. rec Try aciphex  20mg   (or Nexium) Take 30-60 min before first meal of the day and Pepcid 20 mg one bedtime  Stop lisinopril and start bystolic 5 mg one in it's place Stop smoking before it stops it  Only use the nebulizer for your breathing if you need it   08/07/2011 Emili Mcloughlin/ ov  still smoking but cough much better not needing any breathing treatments at all, main c/o is RA symptoms getting worse and concerned being referred to [facility] since it's so far for her to go.  No excess mucus, no limiting sob >>no changes   09/22/2011 Acute OV  Complains of pt c/o prod cough, ear ache, sore throat, and migraines x2weeks with occassional fever/chills, pt states mucous is green is color. pt has been treating with OTC dayquil with little to no relief. She has upcoming back surgery next month.   rec Omnicef 300mg  Twice daily  For  7 days  Mucinex DM Twice daily  As needed  Cough/congestion  Fluids and rest  Hydromet 1-2 tsp every 4-6 hr  As needed  Cough- may make you sleepy     04/05/2012 f/u ov/Kirke Breach seeing Darron Doom in [place] for RA/ still smoking/ daily excess mucus x 6 month sore throat mucus is usually brown worst in am, but  No change doe. No obvious daytime variabilty or assoc   cp or chest tightness,  subjective wheeze overt sinus or hb symptoms. No unusual exp hx    Sleeping ok without nocturnal  or early am exacerbation  of respiratory  c/o's or need for noct saba. Also denies any obvious fluctuation of symptoms with weather or environmental changes or other aggravating or alleviating factors except as outlined above   ROS  The following are not active complaints unless bolded sore throat, dysphagia, dental problems, itching, sneezing,  nasal congestion or excess/ purulent secretions, ear ache,   fever, chills, sweats, unintended wt loss, pleuritic or exertional cp, hemoptysis,  orthopnea pnd or leg swelling, presyncope, palpitations, heartburn, abdominal pain, anorexia, nausea, vomiting, diarrhea  or change in bowel or urinary habits, change in stools or urine, dysuria,hematuria,  rash, arthralgias, visual complaints, headache, numbness weakness or ataxia or problems with walking or coordination,  change in mood/affect or memory.                    Objective:   Physical Exam  06/19/2011  Wt 184 > 08/07/2011 186>>183 09/22/2011 > 04/05/2012  192  GEN: A/Ox3; pleasant , NAD  HEENT:  [place]/AT,  EACs-clear, TMs-wnl, NOSE-clear drainage  THROAT-clear, no lesions, no postnasal drip or exudate noted.  edentulous  NECK:  Supple w/ fair ROM; no JVD; normal carotid impulses w/o bruits; no thyromegaly or  nodules palpated; no lymphadenopathy.  RESP  Disant bs - no rales/ or rhonchi.no accessory muscle use, no dullness to percussion  CARD:  RRR, no m/r/g  , no peripheral edema, pulses intact, no cyanosis or clubbing.  GI:   Soft & nt; nml bowel sounds; no organomegaly or masses detected.  Musco: Warm bil, no deformities or joint swelling noted.   Neuro: alert, no focal deficits noted.    Skin: Warm, no lesions or rashes  CXR  04/05/2012 :  Stable. No acute cardiopulmonary process.       Assessment & Plan:

## 2012-04-05 NOTE — Patient Instructions (Addendum)
Mucinex dm 1-2 every 12 hours for cough  Add pepcid 20 mg one at bedtime and keep taking the nexium as you are  Doxycyline 100mg  one twice daily with a glass of water should turn the mucus clear  The key is to stop smoking completely before smoking completely stops you!   Please schedule a follow up office visit in 4 weeks, sooner if needed

## 2012-04-06 NOTE — Assessment & Plan Note (Addendum)
-    off ACEI 06/19/2011  > much better 08/07/2011     -  PFT's  08/07/2011  with min airflow obst but DLCO 60% corrects to 86% ? RA related  Now with more of a typical smokers cough related to poor mc function - rx dox and pred, also add pepcid at hs in case some of her symptoms in am are related to noct gerd  Smoking cessation discussed separately.

## 2012-04-06 NOTE — Assessment & Plan Note (Signed)
>   3 min discussion  I reviewed the Flethcher curve with patient that basically indicates  if you quit smoking when your best day FEV1 is still well preserved it is highly unlikely you will progress to severe disease and informed the patient there was no medication on the market that has proven to change the curve or the likelihood of progression.  Therefore stopping smoking and maintaining abstinence is the most important aspect of care, not choice of inhalers or for that matter, doctors.   

## 2012-04-23 ENCOUNTER — Encounter (HOSPITAL_COMMUNITY): Payer: Self-pay | Admitting: Physical Medicine and Rehabilitation

## 2012-04-23 ENCOUNTER — Emergency Department (HOSPITAL_COMMUNITY): Payer: Medicaid Other

## 2012-04-23 ENCOUNTER — Emergency Department (HOSPITAL_COMMUNITY)
Admission: EM | Admit: 2012-04-23 | Discharge: 2012-04-23 | Disposition: A | Discharge status: Home / Self Care | Payer: Medicaid Other | Attending: Emergency Medicine | Admitting: Emergency Medicine

## 2012-04-23 DIAGNOSIS — F17200 Nicotine dependence, unspecified, uncomplicated: Secondary | ICD-10-CM | POA: Insufficient documentation

## 2012-04-23 DIAGNOSIS — J4489 Other specified chronic obstructive pulmonary disease: Secondary | ICD-10-CM | POA: Insufficient documentation

## 2012-04-23 DIAGNOSIS — G4700 Insomnia, unspecified: Secondary | ICD-10-CM | POA: Insufficient documentation

## 2012-04-23 DIAGNOSIS — I10 Essential (primary) hypertension: Secondary | ICD-10-CM | POA: Insufficient documentation

## 2012-04-23 DIAGNOSIS — Z79899 Other long term (current) drug therapy: Secondary | ICD-10-CM | POA: Insufficient documentation

## 2012-04-23 DIAGNOSIS — I7300 Raynaud's syndrome without gangrene: Secondary | ICD-10-CM | POA: Insufficient documentation

## 2012-04-23 DIAGNOSIS — M129 Arthropathy, unspecified: Secondary | ICD-10-CM | POA: Insufficient documentation

## 2012-04-23 DIAGNOSIS — J40 Bronchitis, not specified as acute or chronic: Secondary | ICD-10-CM

## 2012-04-23 DIAGNOSIS — J309 Allergic rhinitis, unspecified: Secondary | ICD-10-CM | POA: Insufficient documentation

## 2012-04-23 DIAGNOSIS — J449 Chronic obstructive pulmonary disease, unspecified: Secondary | ICD-10-CM | POA: Insufficient documentation

## 2012-04-23 MED ORDER — DEXAMETHASONE SODIUM PHOSPHATE 10 MG/ML IJ SOLN
10.0000 mg | Freq: Once | INTRAMUSCULAR | Status: AC
  Administered 2012-04-23: 10 mg via INTRAMUSCULAR
  Filled 2012-04-23: qty 1

## 2012-04-23 MED ORDER — ALBUTEROL SULFATE HFA 108 (90 BASE) MCG/ACT IN AERS: 1.0000 | INHALATION_SPRAY | Freq: Four times a day (QID) | RESPIRATORY_TRACT | Status: DC | PRN

## 2012-04-23 MED ORDER — AZITHROMYCIN 250 MG PO TABS: 250.0000 mg | ORAL_TABLET | Freq: Every day | ORAL | Status: DC

## 2012-04-23 MED ORDER — ONDANSETRON HCL 4 MG/2ML IJ SOLN
4.0000 mg | Freq: Once | INTRAMUSCULAR | Status: AC
  Administered 2012-04-23: 4 mg via INTRAMUSCULAR
  Filled 2012-04-23: qty 2

## 2012-04-23 MED ORDER — HYDROMORPHONE HCL PF 1 MG/ML IJ SOLN
1.0000 mg | Freq: Once | INTRAMUSCULAR | Status: AC
  Administered 2012-04-23: 1 mg via INTRAMUSCULAR
  Filled 2012-04-23: qty 1

## 2012-04-23 NOTE — ED Notes (Signed)
Pt presents to department for evaluation of cough, sinus congestion and body aches. Ongoing x2 weeks. Denies fever. She is conscious alert and oriented x4. No signs of acute distress at the time.

## 2012-04-23 NOTE — ED Provider Notes (Signed)
Medical screening examination/treatment/procedure(s) were performed by non-physician practitioner and as supervising physician I was immediately available for consultation/collaboration.  Cheri Guppy, MD 04/23/12 [phone]

## 2012-04-23 NOTE — ED Provider Notes (Signed)
History     CSN: [id number]  Arrival date & time 04/23/12  1244   First MD Initiated Contact with Patient 04/23/12 1335      Chief Complaint  Patient presents with  . Nasal Congestion  . Cough  . Generalized Body Aches    (Consider location/radiation/quality/duration/timing/severity/associated sxs/prior treatment) Patient is a 61 y.o. female presenting with cough. The history is provided by the patient. No language interpreter was used.  Cough This is a new problem. Episode onset: 2 weeks. The problem occurs constantly. The problem has been gradually worsening. The cough is productive of sputum. There has been no fever. She has tried nothing for the symptoms. The treatment provided moderate relief. Risk factors: smoker. She is not a smoker. Her past medical history does not include bronchitis.  Pt reports she has been coughing for over 2 weeks.  Pt reports she is coughing up colored phelgm.  Past Medical History  Diagnosis Date  . Arthritis   . COPD (chronic obstructive pulmonary disease)   . Fibromyalgia   . Hypertension   . Insomnia   . Allergic rhinitis   . Neck pain   . Raynaud's disease     Past Surgical History  Procedure Date  . Abdominal hysterectomy   . Tubal ligation   . Hemorroidectomy   . Breast biopsy   . Lumbar laminectomy/decompression microdiscectomy 10/09/2011    Procedure: LUMBAR LAMINECTOMY/DECOMPRESSION MICRODISCECTOMY 1 LEVEL;  Surgeon: Maeola Harman, MD;  Location: [facility];  Service: Neurosurgery;  Laterality: Right;  RIGHT Lumbar four-five foraminotomy with possible microdiskectomy    Family History  Problem Relation Age of Onset  . Rheum arthritis Mother   . Breast cancer Mother   . Breast cancer Sister   . Ovarian cancer Maternal Grandmother   . Rectal cancer Paternal Grandmother     History  Substance Use Topics  . Smoking status: Current Every Day Smoker -- 1.0 packs/day for 25 years    Types: Cigarettes  . Smokeless tobacco:  Never Used  . Alcohol Use: No     Quit in 2001    OB History    Grav Para Term Preterm Abortions TAB SAB Ect Mult Living                  Review of Systems  Respiratory: Positive for cough.   All other systems reviewed and are negative.    Allergies  Prednisone; Celebrex; Doxycycline; Fish allergy; Lisinopril; Methotrexate derivatives; Naproxen; Nsaids; Sumatriptan; Latex; Penicillins; and Sulfa antibiotics  Home Medications   Current Outpatient Rx  Name Route Sig Dispense Refill  . ALBUTEROL SULFATE HFA 108 (90 BASE) MCG/ACT IN AERS Inhalation Inhale 2 puffs into the lungs 3 (three) times daily as needed. For shortness of breath     . ALLOPURINOL 100 MG PO TABS Oral Take 100 mg by mouth daily as needed.     Marland Kitchen AMITRIPTYLINE HCL 150 MG PO TABS Oral Take 150 mg by mouth at bedtime.      Marland Kitchen BISOPROLOL FUMARATE 5 MG PO TABS Oral Take 5 mg by mouth daily.    Marland Kitchen CETIRIZINE HCL 5 MG PO TABS Oral Take 5 mg by mouth daily.    . COLCHICINE 0.6 MG PO TABS Oral Take 0.6 mg by mouth daily as needed.     . CYCLOBENZAPRINE HCL 10 MG PO TABS Oral Take 10 mg by mouth 3 (three) times daily as needed. For spasms    . DIAZEPAM 5 MG PO TABS Oral  Take 5 mg by mouth at bedtime.    Marland Kitchen DOXYCYCLINE HYCLATE 100 MG PO TABS Oral Take 100 mg by mouth 2 (two) times daily. 10 day supply started last week for URI    . ESOMEPRAZOLE MAGNESIUM 40 MG PO CPDR Oral Take 40 mg by mouth 2 (two) times daily.      Marland Kitchen MONTELUKAST SODIUM 10 MG PO TABS Oral Take 10 mg by mouth at bedtime.      . NYSTATIN 100000 UNIT/GM EX CREA Topical Apply 1 application topically 3 (three) times daily as needed. For sores    . OXYCODONE-ACETAMINOPHEN 7.5-325 MG PO TABS Oral Take 1 tablet by mouth every 6 (six) hours as needed. For pain    . POLYETHYLENE GLYCOL 3350 PO PACK Oral Take 17 g by mouth 2 (two) times daily.    . TOPIRAMATE 50 MG PO TABS Oral Take 50 mg by mouth 2 (two) times daily.    Marland Kitchen ZOLPIDEM TARTRATE 10 MG PO TABS Oral Take 10  mg by mouth at bedtime as needed. For sleep       BP 158/86  Pulse 74  Temp 98.3 F (36.8 C) (Oral)  Resp 20  SpO2 96%  Physical Exam  Nursing note reviewed. Constitutional: She is oriented to person, place, and time. She appears well-developed and well-nourished.  HENT:  Head: Normocephalic and atraumatic.  Right Ear: External ear normal.  Left Ear: External ear normal.  Nose: Nose normal.  Mouth/Throat: Oropharynx is clear and moist.  Eyes: Conjunctivae normal and EOM are normal. Pupils are equal, round, and reactive to light.  Neck: Normal range of motion. Neck supple.  Cardiovascular: Normal rate.   Pulmonary/Chest: Effort normal and breath sounds normal.  Abdominal: Soft.  Musculoskeletal: Normal range of motion.  Neurological: She is alert and oriented to person, place, and time.  Skin: Skin is warm.  Psychiatric: She has a normal mood and affect.    ED Course  Procedures (including critical care time)  Labs Reviewed - No data to display No results found.   1. Bronchitis       MDM  Chest xray no pneumonia.   I will treat with         Elson Areas, [place] 04/23/12 1449

## 2012-04-27 ENCOUNTER — Ambulatory Visit
Admission: RE | Admit: 2012-04-27 | Discharge: 2012-04-27 | Disposition: A | Discharge status: Home / Self Care | Payer: Medicaid Other | Source: Ambulatory Visit | Attending: Family Medicine | Admitting: Family Medicine

## 2012-04-27 DIAGNOSIS — Z803 Family history of malignant neoplasm of breast: Secondary | ICD-10-CM

## 2012-04-27 DIAGNOSIS — Z1231 Encounter for screening mammogram for malignant neoplasm of breast: Secondary | ICD-10-CM

## 2012-05-04 ENCOUNTER — Encounter: Payer: Self-pay | Admitting: Internal Medicine

## 2012-05-04 ENCOUNTER — Ambulatory Visit (INDEPENDENT_AMBULATORY_CARE_PROVIDER_SITE_OTHER): Payer: Medicaid Other | Admitting: Internal Medicine

## 2012-05-04 VITALS — BP 138/80 | HR 63 | Temp 98.4°F | Ht 67.0 in | Wt 192.8 lb

## 2012-05-04 DIAGNOSIS — F17200 Nicotine dependence, unspecified, uncomplicated: Secondary | ICD-10-CM

## 2012-05-04 DIAGNOSIS — J449 Chronic obstructive pulmonary disease, unspecified: Secondary | ICD-10-CM

## 2012-05-04 NOTE — Progress Notes (Signed)
Subjective:    Patient ID: Denise Simmons, female    DOB: [birth year]    MRN: [id number]    Brief patient profile:  70 yobf still smoker with COPD and refractory symptoms on ACEI when first seen in pulmonary clinic in 2012 but proved to have very minimal airflow obst by pft's 08/07/2011   HPI 06/19/2011 1st pulmonary cc worse sob  since Oct 30th 2012 when moved to [place] cc sorethroat, trouble swallowing s/p dilation on ACEI cough severe, mostly daytime and dry, comes and goes,  right now better,  Sometimes vomiting. mutiple nebs and inhalers not really helping but the cough med helps the most with her breathing which is better as long as cough is controlled Has RA but  States ok control. rec Try aciphex  20mg   (or Nexium) Take 30-60 min before first meal of the day and Pepcid 20 mg one bedtime  Stop lisinopril and start bystolic 5 mg one in it's place Stop smoking before it stops it  Only use the nebulizer for your breathing if you need it   08/07/2011 Kyair Ditommaso/ ov  still smoking but cough much better not needing any breathing treatments at all, main c/o is RA symptoms getting worse and concerned being referred to [facility] since it's so far for her to go.  No excess mucus, no limiting sob >>no changes   09/22/2011 Acute OV  Complains of pt c/o prod cough, ear ache, sore throat, and migraines x2weeks with occassional fever/chills, pt states mucous is green is color. pt has been treating with OTC dayquil with little to no relief. She has upcoming back surgery next month.   rec Omnicef 300mg  Twice daily  For  7 days  Mucinex DM Twice daily  As needed  Cough/congestion  Fluids and rest  Hydromet 1-2 tsp every 4-6 hr  As needed  Cough- may make you sleepy     04/05/2012 f/u ov/Denise Simmons seeing Denise Simmons in [place] for RA/ still smoking/ daily excess mucus x 6 month sore throat mucus is usually brown worst in am, but  No change doe.  rec Mucinex dm 1-2 every 12 hours for cough Add pepcid 20 mg one at  bedtime and keep taking the nexium as you are Doxycyline 100mg  one twice daily with a glass of water should turn the mucus clear The key is to stop smoking completely before smoking completely stops you!    05/04/2012 f/u ov/Denise Simmons cc breathing better, now being followed by Dr Haswell Callas for "allergies" but still smoking.   No obvious daytime variability  to sob  or assoc   cp or chest tightness, subjective wheeze overt sinus or hb symptoms. No unusual exp hx    Sleeping ok without nocturnal  or early am exacerbation  of respiratory  c/o's or need for noct saba. Also denies any obvious fluctuation of symptoms with weather or environmental changes or other aggravating or alleviating factors except as outlined above   ROS  The following are not active complaints unless bolded sore throat, dysphagia, dental problems, itching, sneezing,  nasal congestion or excess/ purulent secretions, ear ache,   fever, chills, sweats, unintended wt loss, pleuritic or exertional cp, hemoptysis,  orthopnea pnd or leg swelling, presyncope, palpitations, heartburn, abdominal pain, anorexia, nausea, vomiting, diarrhea  or change in bowel or urinary habits, change in stools or urine, dysuria,hematuria,  rash, arthralgias, visual complaints, headache, numbness weakness or ataxia or problems with walking or coordination,  change in mood/affect or memory.  Objective:   Physical Exam  06/19/2011  Wt 184 > 08/07/2011 186>>183 09/22/2011 > 04/05/2012  192> 05/04/2012  192  GEN: A/Ox3; pleasant , NAD  HEENT:  [place]/AT,  EACs-clear, TMs-wnl, NOSE-clear drainage  THROAT-clear, no lesions, no postnasal drip or exudate noted.  edentulous  NECK:  Supple w/ fair ROM; no JVD; normal carotid impulses w/o bruits; no thyromegaly or nodules palpated; no lymphadenopathy.  RESP  Disant bs - no rales/ or rhonchi.no accessory muscle use, no dullness to percussion  CARD:  RRR, no m/r/g  , no peripheral edema, pulses intact,  no cyanosis or clubbing.  GI:   Soft & nt; nml bowel sounds; no organomegaly or masses detected.  Musco: Warm bil, no deformities or joint swelling noted.   Neuro: alert, no focal deficits noted.    Skin: Warm, no lesions or rashes  CXR  04/05/2012 :  Stable. No acute cardiopulmonary process.       Assessment & Plan:

## 2012-05-04 NOTE — Patient Instructions (Addendum)
The key is to stop smoking completely before smoking completely stops you - this is the most important aspect of your care!  Pulmonary follow up is as needed

## 2012-05-05 NOTE — Assessment & Plan Note (Signed)
-    off ACEI 06/19/2011  > much better 08/07/2011     -  PFT's  08/07/2011  with min airflow obst but DLCO 60% corrects to 86% ? RA relate  She has very min copd so the key here is d/c cigs > discussed separately    Each maintenance medication was reviewed in detail including most importantly the difference between maintenance and as needed and under what circumstances the prns are to be used.  Please see instructions for details which were reviewed in writing and the patient given a copy.

## 2012-05-05 NOTE — Assessment & Plan Note (Signed)
I took an extended  opportunity with this patient to outline the consequences of continued cigarette use  in airway disorders based on all the data we have from the multiple national lung health studies (perfomed over decades at millions of dollars in cost)  indicating that smoking cessation, not choice of inhalers or physicians, is the most important aspect of care.    Pulmonary f/u can be prn

## 2012-05-12 ENCOUNTER — Encounter (HOSPITAL_COMMUNITY): Payer: Self-pay | Admitting: *Deleted

## 2012-05-12 ENCOUNTER — Emergency Department (HOSPITAL_COMMUNITY): Payer: Medicaid Other

## 2012-05-12 ENCOUNTER — Emergency Department (HOSPITAL_COMMUNITY)
Admission: EM | Admit: 2012-05-12 | Discharge: 2012-05-12 | Disposition: A | Discharge status: Home / Self Care | Payer: Medicaid Other | Attending: Emergency Medicine | Admitting: Emergency Medicine

## 2012-05-12 DIAGNOSIS — F17200 Nicotine dependence, unspecified, uncomplicated: Secondary | ICD-10-CM | POA: Insufficient documentation

## 2012-05-12 DIAGNOSIS — M549 Dorsalgia, unspecified: Secondary | ICD-10-CM | POA: Insufficient documentation

## 2012-05-12 DIAGNOSIS — Z9851 Tubal ligation status: Secondary | ICD-10-CM | POA: Insufficient documentation

## 2012-05-12 DIAGNOSIS — I10 Essential (primary) hypertension: Secondary | ICD-10-CM | POA: Insufficient documentation

## 2012-05-12 DIAGNOSIS — R6883 Chills (without fever): Secondary | ICD-10-CM | POA: Insufficient documentation

## 2012-05-12 DIAGNOSIS — I7300 Raynaud's syndrome without gangrene: Secondary | ICD-10-CM | POA: Insufficient documentation

## 2012-05-12 DIAGNOSIS — G8929 Other chronic pain: Secondary | ICD-10-CM | POA: Insufficient documentation

## 2012-05-12 DIAGNOSIS — J4489 Other specified chronic obstructive pulmonary disease: Secondary | ICD-10-CM | POA: Insufficient documentation

## 2012-05-12 DIAGNOSIS — M129 Arthropathy, unspecified: Secondary | ICD-10-CM | POA: Insufficient documentation

## 2012-05-12 DIAGNOSIS — IMO0001 Reserved for inherently not codable concepts without codable children: Secondary | ICD-10-CM | POA: Insufficient documentation

## 2012-05-12 DIAGNOSIS — J449 Chronic obstructive pulmonary disease, unspecified: Secondary | ICD-10-CM | POA: Insufficient documentation

## 2012-05-12 DIAGNOSIS — Z79899 Other long term (current) drug therapy: Secondary | ICD-10-CM | POA: Insufficient documentation

## 2012-05-12 MED ORDER — DIAZEPAM 5 MG PO TABS: 5.0000 mg | ORAL_TABLET | Freq: Two times a day (BID) | ORAL | Status: DC

## 2012-05-12 MED ORDER — HYDROCODONE-ACETAMINOPHEN 5-325 MG PO TABS
2.0000 | ORAL_TABLET | Freq: Once | ORAL | Status: AC
  Administered 2012-05-12: 2 via ORAL
  Filled 2012-05-12: qty 2

## 2012-05-12 MED ORDER — OXYCODONE-ACETAMINOPHEN 10-325 MG PO TABS: 0.5000 | ORAL_TABLET | Freq: Four times a day (QID) | ORAL | Status: DC | PRN

## 2012-05-12 MED ORDER — DIAZEPAM 5 MG PO TABS
5.0000 mg | ORAL_TABLET | Freq: Once | ORAL | Status: AC
  Administered 2012-05-12: 5 mg via ORAL
  Filled 2012-05-12: qty 1

## 2012-05-12 NOTE — ED Provider Notes (Signed)
History  Scribed for Gerhard Munch, MD, the patient was seen in room TR08C/TR08C. This chart was scribed by Candelaria Stagers. The patient's care started at 4:12 PM   CSN: [id number]  Arrival date & time 05/12/12  1521   First MD Initiated Contact with Patient 05/12/12 1553      Chief Complaint  Patient presents with  . Back Pain   The history is provided by the patient. No language interpreter was used.   Denise Simmons is a 61 y.o. female who presents to the Emergency Department complaining of chronic back pain that became worse over the last four days.  She reports she is having trouble walking.  Pt typically uses a cane and has been using a walker over the last four days.  She denies fever.  Pt had discectomy in April of this year.  Pt has an appointment for an injection in four days.  She has h/o fibromyalgia and arthritis.  She has taken percocet with little relief.    Past Medical History  Diagnosis Date  . Arthritis   . COPD (chronic obstructive pulmonary disease)   . Fibromyalgia   . Hypertension   . Insomnia   . Allergic rhinitis   . Neck pain   . Raynaud's disease     Past Surgical History  Procedure Date  . Abdominal hysterectomy   . Tubal ligation   . Hemorroidectomy   . Breast biopsy   . Lumbar laminectomy/decompression microdiscectomy 10/09/2011    Procedure: LUMBAR LAMINECTOMY/DECOMPRESSION MICRODISCECTOMY 1 LEVEL;  Surgeon: Maeola Harman, MD;  Location: [facility];  Service: Neurosurgery;  Laterality: Right;  RIGHT Lumbar four-five foraminotomy with possible microdiskectomy    Family History  Problem Relation Age of Onset  . Rheum arthritis Mother   . Breast cancer Mother   . Breast cancer Sister   . Ovarian cancer Maternal Grandmother   . Rectal cancer Paternal Grandmother     History  Substance Use Topics  . Smoking status: Current Every Day Smoker -- 1.0 packs/day for 25 years    Types: Cigarettes  . Smokeless tobacco: Never Used  . Alcohol  Use: No     Comment: Quit in 2001    OB History    Grav Para Term Preterm Abortions TAB SAB Ect Mult Living                  Review of Systems  Constitutional: Positive for chills. Negative for fever.       Per HPI, otherwise negative  HENT:       Per HPI, otherwise negative  Eyes: Negative.   Respiratory:       Per HPI, otherwise negative  Cardiovascular:       Per HPI, otherwise negative  Gastrointestinal: Negative for vomiting.  Genitourinary: Negative.   Musculoskeletal: Positive for back pain.       Per HPI, otherwise negative  Skin: Negative.   Neurological: Negative for syncope.  All other systems reviewed and are negative.    Allergies  Prednisone; Celebrex; Doxycycline; Fish allergy; Lisinopril; Methotrexate derivatives; Naproxen; Nsaids; Sumatriptan; Latex; Penicillins; and Sulfa antibiotics  Home Medications   Current Outpatient Rx  Name  Route  Sig  Dispense  Refill  . ALBUTEROL SULFATE HFA 108 (90 BASE) MCG/ACT IN AERS   Inhalation   Inhale 2 puffs into the lungs 3 (three) times daily as needed. For shortness of breath          . ALLOPURINOL 100 MG PO  TABS   Oral   Take 100 mg by mouth daily as needed. For gout         . AMITRIPTYLINE HCL 150 MG PO TABS   Oral   Take 150 mg by mouth at bedtime.           Marland Kitchen BISOPROLOL FUMARATE 5 MG PO TABS   Oral   Take 5 mg by mouth daily.         . COLCHICINE 0.6 MG PO TABS   Oral   Take 0.6 mg by mouth daily as needed. For gout         . CYCLOBENZAPRINE HCL 10 MG PO TABS   Oral   Take 10 mg by mouth 2 (two) times daily as needed. For spasms         . DIAZEPAM 5 MG PO TABS   Oral   Take 5 mg by mouth at bedtime.         Marland Kitchen ESOMEPRAZOLE MAGNESIUM 40 MG PO CPDR   Oral   Take 40 mg by mouth 2 (two) times daily.           Marland Kitchen FAMOTIDINE 20 MG PO TABS   Oral   Take 20 mg by mouth at bedtime.          Marland Kitchen LEVOCETIRIZINE DIHYDROCHLORIDE 5 MG PO TABS   Oral   Take 5 mg by mouth every  evening.         Marland Kitchen MONTELUKAST SODIUM 10 MG PO TABS   Oral   Take 10 mg by mouth at bedtime.           . NYSTATIN 100000 UNIT/GM EX CREA   Topical   Apply 1 application topically 3 (three) times daily as needed. For sores         . OLOPATADINE HCL 0.2 % OP SOLN   Ophthalmic   Apply 1 drop to eye daily. Take 1 drop both eyes daily         . OXYCODONE-ACETAMINOPHEN 10-325 MG PO TABS   Oral   Take 0.5 tablets by mouth every 6 (six) hours as needed. For pain         . POLYETHYLENE GLYCOL 3350 PO PACK   Oral   Take 17 g by mouth daily as needed. For constipation         . PREGABALIN 75 MG PO CAPS   Oral   Take 75 mg by mouth 2 (two) times daily.         . TOPIRAMATE 50 MG PO TABS   Oral   Take 50 mg by mouth daily.          Marland Kitchen ZOLPIDEM TARTRATE 10 MG PO TABS   Oral   Take 10 mg by mouth at bedtime as needed. For sleep            BP 143/77  Pulse 68  Temp 98.5 F (36.9 C) (Oral)  Resp 16  SpO2 97%  Physical Exam  Nursing note and vitals reviewed. Constitutional: She is oriented to person, place, and time. She appears well-developed and well-nourished. No distress.  HENT:  Head: Normocephalic and atraumatic.  Eyes: EOM are normal.  Neck: Neck supple. No tracheal deviation present.  Cardiovascular: Normal rate.   Pulmonary/Chest: Effort normal. No respiratory distress.  Abdominal: Soft. There is no tenderness.  Musculoskeletal: Normal range of motion.       Distally neurovascularly intact.  Thigh flexion and extension strength is appropriate bilaterally.  Surgical scar superior sacral region.  Mild tenderness to palpation over the SI joint, right more than left.    Neurological: She is alert and oriented to person, place, and time.  Skin: Skin is warm and dry.  Psychiatric: She has a normal mood and affect. Her behavior is normal.    ED Course  Procedures   DIAGNOSTIC STUDIES: Oxygen Saturation is 97% on room air, normal by my interpretation.      COORDINATION OF CARE:   16:28 Ordered: DG Lumbar Spine 2-3 Views  Labs Reviewed - No data to display Dg Lumbar Spine 2-3 Views  05/12/2012  *RADIOLOGY REPORT*  Clinical Data: Low back pain  LUMBAR SPINE - 2-3 VIEW  Comparison: 03/17/2012, 01/26/2012  Findings: Normal lumbar spine alignment.  Degenerative disc disease and spondylosis most pronounced at L4 L5 and L5 S1.  Vacuum disc phenomenon at L5 S1.  Facets aligned.  Lower lumbar facet arthropathy noted.  No compression fracture, wedge shaped deformity or focal kyphosis.  Atherosclerosis of the aorta.  Pedicles appear intact.  Normal SI joints.  IMPRESSION: Lumbar degenerative disc disease and spondylosis as described.  No acute osseous finding by plain radiography   Original Report Authenticated By: Judie Petit. Miles Costain, M.D.      No diagnosis found.    MDM  I personally performed the services described in this documentation, which was scribed in my presence. The recorded information has been reviewed and is accurate.  This patient with chronic pain, as well as prior discectomy now presents with ongoing low back pain.  On exam she is in no distress.  The patient is afebrile, with no complaints of acute neurologic changes, nor any neural deficits on exam.  Given the patient's history of surgical intervention she had a x-ray performed.  This was negative.  The patient was discharged in stable condition with analgesics, explicit instructions to follow up with her neurosurgeon as soon as possible.  Gerhard Munch, MD 05/12/12 [phone]

## 2012-05-12 NOTE — ED Notes (Signed)
Patient with chronic back pain and had back surgery in April. Patient takes injections from MD office but today the pain is severe and going to her sides.  Patient uses walker at home for ambualtion

## 2012-06-17 ENCOUNTER — Emergency Department (HOSPITAL_COMMUNITY)
Admission: EM | Admit: 2012-06-17 | Discharge: 2012-06-17 | Discharge status: Against Medical Advice | Payer: Medicaid Other | Attending: Emergency Medicine | Admitting: Emergency Medicine

## 2012-06-17 ENCOUNTER — Encounter (HOSPITAL_COMMUNITY): Payer: Self-pay

## 2012-06-17 DIAGNOSIS — M25559 Pain in unspecified hip: Secondary | ICD-10-CM | POA: Insufficient documentation

## 2012-06-17 DIAGNOSIS — M545 Low back pain, unspecified: Secondary | ICD-10-CM | POA: Insufficient documentation

## 2012-06-17 DIAGNOSIS — R21 Rash and other nonspecific skin eruption: Secondary | ICD-10-CM | POA: Insufficient documentation

## 2012-06-17 NOTE — ED Notes (Signed)
Pts family states, "She is going to leave. She is impatient & we are not going to wait." pt & family encouraged to stay for further evaluation & instructed that if the pt does leave to return to the ED for worsening symptoms

## 2012-06-17 NOTE — ED Notes (Addendum)
Pt presents with increased pain to hips and lower back x 10 days. Pt reports getting injections in back and hip on Nov. 18 and was supposed to receive injections 12/16 but was not able to.  Pt also reports rash and burning to both hands after taking doxycycline x 1 month ago.  Pt seen for same, did not receive any RX.  Pt seen at allergy clinic - told allergies to mold and certain foods.  Hands are red, begin as blisters and have hard, dried areas.  Pt reports getting same in mouth, but reports "I get those with humira"  Pt also reports burning rash to buttocks.

## 2012-06-19 ENCOUNTER — Emergency Department (HOSPITAL_COMMUNITY)
Admission: EM | Admit: 2012-06-19 | Discharge: 2012-06-19 | Disposition: A | Discharge status: Home / Self Care | Payer: Medicaid Other | Attending: Emergency Medicine | Admitting: Emergency Medicine

## 2012-06-19 ENCOUNTER — Encounter (HOSPITAL_COMMUNITY): Payer: Self-pay | Admitting: *Deleted

## 2012-06-19 DIAGNOSIS — Z8679 Personal history of other diseases of the circulatory system: Secondary | ICD-10-CM | POA: Insufficient documentation

## 2012-06-19 DIAGNOSIS — G8929 Other chronic pain: Secondary | ICD-10-CM | POA: Insufficient documentation

## 2012-06-19 DIAGNOSIS — M25559 Pain in unspecified hip: Secondary | ICD-10-CM | POA: Insufficient documentation

## 2012-06-19 DIAGNOSIS — J4489 Other specified chronic obstructive pulmonary disease: Secondary | ICD-10-CM | POA: Insufficient documentation

## 2012-06-19 DIAGNOSIS — F17200 Nicotine dependence, unspecified, uncomplicated: Secondary | ICD-10-CM | POA: Insufficient documentation

## 2012-06-19 DIAGNOSIS — J449 Chronic obstructive pulmonary disease, unspecified: Secondary | ICD-10-CM | POA: Insufficient documentation

## 2012-06-19 DIAGNOSIS — Z8739 Personal history of other diseases of the musculoskeletal system and connective tissue: Secondary | ICD-10-CM | POA: Insufficient documentation

## 2012-06-19 DIAGNOSIS — I10 Essential (primary) hypertension: Secondary | ICD-10-CM | POA: Insufficient documentation

## 2012-06-19 DIAGNOSIS — IMO0001 Reserved for inherently not codable concepts without codable children: Secondary | ICD-10-CM | POA: Insufficient documentation

## 2012-06-19 MED ORDER — FENTANYL 25 MCG/HR TD PT72
25.0000 ug | MEDICATED_PATCH | TRANSDERMAL | Status: DC
  Administered 2012-06-19: 25 ug via TRANSDERMAL
  Filled 2012-06-19: qty 1

## 2012-06-19 NOTE — ED Provider Notes (Signed)
History     CSN: [id number]  Arrival date & time 06/19/12  1448   First MD Initiated Contact with Patient 06/19/12 1545      Chief Complaint  Patient presents with  . Hip Pain     HPI Pt has history of sciatica and here with complaints of pain running from lower back. Pt has been getting injections for this pain, but missed last appointment. Pt states her hips hurt so bad that she sits on icepacks. Pt denies any incontinence of bowel or bladder  Past Medical History  Diagnosis Date  . Arthritis   . COPD (chronic obstructive pulmonary disease)   . Fibromyalgia   . Hypertension   . Insomnia   . Allergic rhinitis   . Neck pain   . Raynaud's disease     Past Surgical History  Procedure Date  . Abdominal hysterectomy   . Tubal ligation   . Hemorroidectomy   . Breast biopsy   . Lumbar laminectomy/decompression microdiscectomy 10/09/2011    Procedure: LUMBAR LAMINECTOMY/DECOMPRESSION MICRODISCECTOMY 1 LEVEL;  Surgeon: Maeola Harman, MD;  Location: [facility];  Service: Neurosurgery;  Laterality: Right;  RIGHT Lumbar four-five foraminotomy with possible microdiskectomy    Family History  Problem Relation Age of Onset  . Rheum arthritis Mother   . Breast cancer Mother   . Breast cancer Sister   . Ovarian cancer Maternal Grandmother   . Rectal cancer Paternal Grandmother     History  Substance Use Topics  . Smoking status: Current Every Day Smoker -- 1.0 packs/day for 25 years    Types: Cigarettes  . Smokeless tobacco: Never Used  . Alcohol Use: No     Comment: Quit in 2001    OB History    Grav Para Term Preterm Abortions TAB SAB Ect Mult Living                  Review of Systems All other systems reviewed and are negative Allergies  Prednisone; Celebrex; Doxycycline; Fish allergy; Lisinopril; Methotrexate derivatives; Naproxen; Nsaids; Sumatriptan; Latex; Penicillins; and Sulfa antibiotics  Home Medications   Current Outpatient Rx  Name  Route  Sig   Dispense  Refill  . PROAIR HFA IN   Inhalation   Inhale 2 puffs into the lungs daily as needed.         Marland Kitchen AMITRIPTYLINE HCL 150 MG PO TABS   Oral   Take 150 mg by mouth at bedtime.           Marland Kitchen BISOPROLOL FUMARATE 5 MG PO TABS   Oral   Take 5 mg by mouth daily.         Marland Kitchen DIAZEPAM 5 MG PO TABS   Oral   Take 1 tablet (5 mg total) by mouth 2 (two) times daily.   10 tablet   0   . ESOMEPRAZOLE MAGNESIUM 40 MG PO CPDR   Oral   Take 40 mg by mouth 2 (two) times daily.           Marland Kitchen FAMOTIDINE 20 MG PO TABS   Oral   Take 20 mg by mouth at bedtime.          Marland Kitchen LEVOCETIRIZINE DIHYDROCHLORIDE 5 MG PO TABS   Oral   Take 5 mg by mouth every evening.         Marland Kitchen MONTELUKAST SODIUM 10 MG PO TABS   Oral   Take 10 mg by mouth at bedtime.           Marland Kitchen  NYSTATIN 100000 UNIT/GM EX CREA   Topical   Apply 1 application topically 3 (three) times daily as needed. For sores         . OXYCODONE-ACETAMINOPHEN 10-325 MG PO TABS   Oral   Take 0.5 tablets by mouth every 6 (six) hours as needed. For pain   12 tablet   0   . POLYETHYLENE GLYCOL 3350 PO PACK   Oral   Take 17 g by mouth daily as needed. For constipation         . PREGABALIN 75 MG PO CAPS   Oral   Take 75 mg by mouth 2 (two) times daily.         Marland Kitchen ZOLPIDEM TARTRATE 10 MG PO TABS   Oral   Take 5 mg by mouth at bedtime as needed. For sleep         . ALBUTEROL SULFATE HFA 108 (90 BASE) MCG/ACT IN AERS   Inhalation   Inhale 2 puffs into the lungs 3 (three) times daily as needed. For shortness of breath          . ALLOPURINOL 100 MG PO TABS   Oral   Take 100 mg by mouth daily as needed. For gout         . COLCHICINE 0.6 MG PO TABS   Oral   Take 0.6 mg by mouth daily as needed. For gout         . OLOPATADINE HCL 0.2 % OP SOLN   Ophthalmic   Apply 1 drop to eye daily. Take 1 drop both eyes daily         . TOPIRAMATE 50 MG PO TABS   Oral   Take 50 mg by mouth daily.            BP 141/84   Pulse 57  Temp 98.3 F (36.8 C) (Oral)  Resp 18  SpO2 98%  Physical Exam  Nursing note and vitals reviewed. Constitutional: She is oriented to person, place, and time. She appears well-developed and well-nourished. No distress.  HENT:  Head: Normocephalic and atraumatic.  Eyes: Pupils are equal, round, and reactive to light.  Neck: Normal range of motion.  Cardiovascular: Normal rate and intact distal pulses.   Pulmonary/Chest: No respiratory distress.  Abdominal: Normal appearance. She exhibits no distension.  Musculoskeletal: She exhibits tenderness (Tenderness in lumbar spine area to palpation and movement).  Neurological: She is alert and oriented to person, place, and time. No cranial nerve deficit or sensory deficit.  Reflex Scores:      Patellar reflexes are 2+ on the right side and 2+ on the left side. Skin: Skin is warm and dry. No rash noted.  Psychiatric: She has a normal mood and affect. Her behavior is normal.    ED Course  Procedures (including critical care time)  Labs Reviewed - No data to display No results found.   1. Chronic pain       MDM  We'll place a fentanyl pain patch.       Nelia Shi, MD 06/19/12 [phone]

## 2012-06-19 NOTE — ED Notes (Signed)
Pt has history of sciatica and here with complaints of pain running from lower back.  Pt has been getting injections for this pain, but missed last appointment.  Pt states her hips hurt so bad that she sits on icepacks.  Pt denies any incontinence of bowel or bladder

## 2012-08-04 ENCOUNTER — Emergency Department (HOSPITAL_COMMUNITY)
Admission: EM | Admit: 2012-08-04 | Discharge: 2012-08-04 | Disposition: A | Discharge status: Home / Self Care | Payer: Medicaid Other | Source: Home / Self Care | Attending: Emergency Medicine | Admitting: Emergency Medicine

## 2012-08-04 ENCOUNTER — Encounter (HOSPITAL_COMMUNITY): Payer: Self-pay | Admitting: Emergency Medicine

## 2012-08-04 DIAGNOSIS — L439 Lichen planus, unspecified: Secondary | ICD-10-CM

## 2012-08-04 MED ORDER — BETAMETHASONE DIPROPIONATE 0.05 % EX OINT: TOPICAL_OINTMENT | Freq: Two times a day (BID) | CUTANEOUS | Status: DC

## 2012-08-04 MED ORDER — METHYLPREDNISOLONE ACETATE 80 MG/ML IJ SUSP
INTRAMUSCULAR | Status: AC
  Filled 2012-08-04: qty 1

## 2012-08-04 MED ORDER — METHYLPREDNISOLONE ACETATE 80 MG/ML IJ SUSP
80.0000 mg | Freq: Once | INTRAMUSCULAR | Status: AC
  Administered 2012-08-04: 80 mg via INTRAMUSCULAR

## 2012-08-04 MED ORDER — METHYLPREDNISOLONE 16 MG PO TABS: 16.0000 mg | ORAL_TABLET | Freq: Two times a day (BID) | ORAL | Status: DC

## 2012-08-04 NOTE — ED Provider Notes (Signed)
Chief Complaint  Patient presents with  . Rash    History of Present Illness:   Denise Simmons is a 62 year old female with multiple medical problems including COPD, hypertension, rheumatoid arthritis, and cigarette smoking. She's on numerous medications and has lots of allergies. She presents today with a 2 month history of a rash. This came on after allergy testing done by Dr. Madie Reno. Allergy testing done allergies to wheat, yeast, and mold. The rash began in the thighs and spread to the back. It itches and hurts. She also has rash on her hands as well. She's been applying Neosporin. The rash in the hands seemed to come on after she shampooed her carpet, but is been coming and going since then without any relation to exposure to carpet shampoo. The rash does not appear to be related to exposure to changes in soaps, detergents, washing powders, dryer sheets, fabric softeners. She has not been exposed to plants, animals, any new medications or foods, cosmetics, or household chemicals. She denies any shortness of breath, wheezing, or swelling of the lips, tongue, or throat.  Review of Systems:  Other than noted above, the patient denies any of the following symptoms: Systemic:  No fever, chills, sweats, weight loss, or fatigue. ENT:  No nasal congestion, rhinorrhea, sore throat, swelling of lips, tongue or throat. Resp:  No cough, wheezing, or shortness of breath. Skin:  No rash, itching, nodules, or suspicious lesions.  PMFSH:  Past medical history, family history, social history, meds, and allergies were reviewed.  Physical Exam:   Vital signs:  BP 153/92  Pulse 65  Temp 98.2 F (36.8 C) (Oral)  Resp 18  SpO2 96% Gen:  Alert, oriented, in no distress. ENT:  Pharynx clear, no intraoral lesions, moist mucous membranes. Lungs:  Clear to auscultation. Skin:  No rash on her thighs is located on the medial thighs bilaterally and consists of multiple, small, violaceous papules. The rash on  the back is also composed of small papules, some of which are crusted. The rash on her hands looks totally different and she is Palmar erythema and scaling in the creases of her hands and also in the interdigital web between the thumb and index finger both hands.  Course in Urgent Care Center:   She was given Depo-Medrol 80 mg IM.   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  There is a wide differential diagnosis of this rash including contact dermatitis, eczema, lichen planus, psoriasis, dyshidrotic eczema, or dermatitis herpetiformis (she doesn't have any symptoms of celiac disease to go along with this such as diarrhea or weight loss). I think she will need to see a dermatologist to get this sorted out and she was given the name of Dr. Para Skeans to followup with.  Plan:   1.  The following meds were prescribed:   New Prescriptions   BETAMETHASONE DIPROPIONATE (DIPROLENE) 0.05 % OINTMENT    Apply topically 2 (two) times daily.   METHYLPREDNISOLONE (MEDROL) 16 MG TABLET    Take 1 tablet (16 mg total) by mouth 2 (two) times daily.   2.  The patient was instructed in symptomatic care and handouts were given. 3.  The patient was told to return if becoming worse in any way, if no better in 3 or 4 days, and given some red flag symptoms that would indicate earlier return.     Reuben Likes, MD 08/04/12 [phone]

## 2012-08-04 NOTE — ED Notes (Addendum)
Pt is here for rash on legs and back x2 months and rash on hands x14 months Rash on hands start out as small bumps and end up as dried cracked skin and will bleed occasionally  Denies: drainage, f/v/n/d Has tried neosporin and benadryl cream Sees an allergist but can't get in to seem him.   She is alert w/no signs of acute distress.

## 2012-08-22 MED ORDER — TRIAMCINOLONE ACETONIDE 0.1 % EX CREA: TOPICAL_CREAM | Freq: Three times a day (TID) | CUTANEOUS | Status: AC

## 2012-08-22 NOTE — ED Notes (Signed)
Patient called, c/o she is unable to afford her Rx that is not covered under her insurance. Passed to Dr Lorenz Coaster, attending MD, who e-prescribed new rx to pt preferred pharmacy . Call to patient to advise of same

## 2012-10-11 ENCOUNTER — Emergency Department (HOSPITAL_COMMUNITY)
Admission: EM | Admit: 2012-10-11 | Discharge: 2012-10-11 | Disposition: A | Discharge status: Home / Self Care | Payer: Medicaid Other | Attending: Emergency Medicine | Admitting: Emergency Medicine

## 2012-10-11 ENCOUNTER — Emergency Department (HOSPITAL_COMMUNITY): Payer: Medicaid Other

## 2012-10-11 ENCOUNTER — Encounter (HOSPITAL_COMMUNITY): Payer: Self-pay | Admitting: Emergency Medicine

## 2012-10-11 DIAGNOSIS — Y939 Activity, unspecified: Secondary | ICD-10-CM | POA: Insufficient documentation

## 2012-10-11 DIAGNOSIS — IMO0002 Reserved for concepts with insufficient information to code with codable children: Secondary | ICD-10-CM | POA: Insufficient documentation

## 2012-10-11 DIAGNOSIS — Y9289 Other specified places as the place of occurrence of the external cause: Secondary | ICD-10-CM | POA: Insufficient documentation

## 2012-10-11 DIAGNOSIS — J449 Chronic obstructive pulmonary disease, unspecified: Secondary | ICD-10-CM | POA: Insufficient documentation

## 2012-10-11 DIAGNOSIS — S8990XA Unspecified injury of unspecified lower leg, initial encounter: Secondary | ICD-10-CM | POA: Insufficient documentation

## 2012-10-11 DIAGNOSIS — W06XXXA Fall from bed, initial encounter: Secondary | ICD-10-CM | POA: Insufficient documentation

## 2012-10-11 DIAGNOSIS — F17200 Nicotine dependence, unspecified, uncomplicated: Secondary | ICD-10-CM | POA: Insufficient documentation

## 2012-10-11 DIAGNOSIS — I10 Essential (primary) hypertension: Secondary | ICD-10-CM | POA: Insufficient documentation

## 2012-10-11 DIAGNOSIS — T148XXA Other injury of unspecified body region, initial encounter: Secondary | ICD-10-CM | POA: Insufficient documentation

## 2012-10-11 DIAGNOSIS — Z8679 Personal history of other diseases of the circulatory system: Secondary | ICD-10-CM | POA: Insufficient documentation

## 2012-10-11 DIAGNOSIS — Z8739 Personal history of other diseases of the musculoskeletal system and connective tissue: Secondary | ICD-10-CM | POA: Insufficient documentation

## 2012-10-11 DIAGNOSIS — M129 Arthropathy, unspecified: Secondary | ICD-10-CM | POA: Insufficient documentation

## 2012-10-11 DIAGNOSIS — Z79899 Other long term (current) drug therapy: Secondary | ICD-10-CM | POA: Insufficient documentation

## 2012-10-11 DIAGNOSIS — J4489 Other specified chronic obstructive pulmonary disease: Secondary | ICD-10-CM | POA: Insufficient documentation

## 2012-10-11 DIAGNOSIS — Z9889 Other specified postprocedural states: Secondary | ICD-10-CM | POA: Insufficient documentation

## 2012-10-11 DIAGNOSIS — S79929A Unspecified injury of unspecified thigh, initial encounter: Secondary | ICD-10-CM | POA: Insufficient documentation

## 2012-10-11 DIAGNOSIS — S79919A Unspecified injury of unspecified hip, initial encounter: Secondary | ICD-10-CM | POA: Insufficient documentation

## 2012-10-11 MED ORDER — METHYLPREDNISOLONE (PAK) 4 MG PO TABS: ORAL_TABLET | ORAL | Status: DC

## 2012-10-11 NOTE — ED Notes (Signed)
Denise Simmons out of bed 2 weeks ago called dr and they could not get her in till the 29   Left ankle hurts and is swollen rt hip still hurts , face hurts  And she aches  No other injuries since then

## 2012-10-11 NOTE — ED Notes (Signed)
Patient states she was disoriented for apprx four days. She doesn't remember a lot over those four days and she states she had a major headache x4 days and is now just a "nagging" headache. She is A/O x4.

## 2012-10-11 NOTE — ED Provider Notes (Signed)
History     CSN: [id number]  Arrival date & time 10/11/12  1351   First MD Initiated Contact with Patient 10/11/12 1503      No chief complaint on file.   (Consider location/radiation/quality/duration/timing/severity/associated sxs/prior treatment) HPI Patient reports she fell out of bed 13 days ago. She's been seen at urgent care center since the event. She's been treating herself with Percocet without pain relief. She complains of headache since the event low back pain right hip pain and left ankle pain. Pain is worse with movement improved with remaining still. She feels that her fibromyalgia may be flaring accounting for part of her pain. She is requesting Medrol Dosepak which is helped fibromyalgia pain in the past. Pain is moderate to severe Past Medical History  Diagnosis Date  . Arthritis   . COPD (chronic obstructive pulmonary disease)   . Fibromyalgia   . Hypertension   . Insomnia   . Allergic rhinitis   . Neck pain   . Raynaud's disease    chronic pain followed in pain clinic  Past Surgical History  Procedure Laterality Date  . Abdominal hysterectomy    . Tubal ligation    . Hemorroidectomy    . Breast biopsy    . Lumbar laminectomy/decompression microdiscectomy  10/09/2011    Procedure: LUMBAR LAMINECTOMY/DECOMPRESSION MICRODISCECTOMY 1 LEVEL;  Surgeon: Maeola Harman, MD;  Location: [facility];  Service: Neurosurgery;  Laterality: Right;  RIGHT Lumbar four-five foraminotomy with possible microdiskectomy    Family History  Problem Relation Age of Onset  . Rheum arthritis Mother   . Breast cancer Mother   . Breast cancer Sister   . Ovarian cancer Maternal Grandmother   . Rectal cancer Paternal Grandmother     History  Substance Use Topics  . Smoking status: Current Every Day Smoker -- 1.00 packs/day for 25 years    Types: Cigarettes  . Smokeless tobacco: Never Used  . Alcohol Use: No     Comment: Quit in 2001    OB History   Grav Para Term Preterm  Abortions TAB SAB Ect Mult Living                  Review of Systems  Musculoskeletal: Positive for arthralgias and gait problem.       Walks with cane  Neurological: Positive for headaches.  All other systems reviewed and are negative.    Allergies  Prednisone; Celebrex; Doxycycline; Estradiol; Fish allergy; Lisinopril; Methotrexate derivatives; Naproxen; Nsaids; Sumatriptan; Latex; Penicillins; and Sulfa antibiotics  Home Medications   Current Outpatient Rx  Name  Route  Sig  Dispense  Refill  . albuterol (PROVENTIL HFA;VENTOLIN HFA) 108 (90 BASE) MCG/ACT inhaler   Inhalation   Inhale 2 puffs into the lungs 3 (three) times daily as needed. For shortness of breath          . Alum & Mag Hydroxide-Simeth (MAGIC MOUTHWASH) SOLN   Oral   Take 5 mLs by mouth 2 (two) times daily as needed (for oral care).         Marland Kitchen amitriptyline (ELAVIL) 150 MG tablet   Oral   Take 150 mg by mouth at bedtime.           . bisoprolol (ZEBETA) 5 MG tablet   Oral   Take 5 mg by mouth daily.         . cyclobenzaprine (FLEXERIL) 10 MG tablet   Oral   Take 10 mg by mouth 2 (two) times daily as  needed for muscle spasms.         . diazepam (VALIUM) 5 MG tablet   Oral   Take 5 mg by mouth daily as needed for anxiety or sleep.         Marland Kitchen esomeprazole (NEXIUM) 40 MG capsule   Oral   Take 40 mg by mouth 2 (two) times daily.           . famotidine (PEPCID) 20 MG tablet   Oral   Take 20 mg by mouth 2 (two) times daily. With nexium         . hydrocortisone (ANUSOL-HC) 2.5 % rectal cream   Topical   Apply 1 application topically 2 (two) times daily as needed (itching).         . montelukast (SINGULAIR) 10 MG tablet   Oral   Take 10 mg by mouth at bedtime.           . Olopatadine HCl (PATADAY) 0.2 % SOLN   Both Eyes   Place 1 drop into both eyes 2 (two) times daily.          Marland Kitchen oxyCODONE-acetaminophen (PERCOCET) 10-325 MG per tablet   Oral   Take 0.5 tablets by mouth  every 6 (six) hours as needed. For pain   12 tablet   0   . polyethylene glycol (MIRALAX / GLYCOLAX) packet   Oral   Take 17 g by mouth daily as needed. For constipation         . pregabalin (LYRICA) 50 MG capsule   Oral   Take 50 mg by mouth 2 (two) times daily.         Marland Kitchen topiramate (TOPAMAX) 50 MG tablet   Oral   Take 50 mg by mouth 2 (two) times daily.          Marland Kitchen triamcinolone cream (KENALOG) 0.1 %   Topical   Apply topically 3 (three) times daily.   454 g   2   . zolpidem (AMBIEN) 10 MG tablet   Oral   Take 10 mg by mouth at bedtime as needed for sleep.            BP 141/77  Pulse 78  Temp(Src) 98.5 F (36.9 C) (Oral)  Resp 16  SpO2 95%  Physical Exam  Nursing note and vitals reviewed. Constitutional: She appears well-developed and well-nourished.  HENT:  Head: Normocephalic and atraumatic.  Right Ear: External ear normal.  Left Ear: External ear normal.  Eyes: Conjunctivae are normal. Pupils are equal, round, and reactive to light.  Neck: Neck supple. No tracheal deviation present. No thyromegaly present.  Cardiovascular: Normal rate and regular rhythm.   No murmur heard. Pulmonary/Chest: Effort normal and breath sounds normal.  Abdominal: Soft. Bowel sounds are normal. She exhibits no distension. There is no tenderness.  Musculoskeletal: Normal range of motion. She exhibits no edema and no tenderness.  Entire spine nontender, pelvis stable nontender, right lower extremity minimally tender at hip no pain on internal rotation of the thigh no deformity neurovascular intact left lower extremity minimally tender at ankle and medial lateral malleolar no deformity. Upper shoulder is no contusion abrasion or tenderness neurovascularly intact  Neurological: She is alert. Coordination normal.  Skin: Skin is warm and dry. No rash noted.  Psychiatric: She has a normal mood and affect.    ED Course  Procedures (including critical care time)  Labs Reviewed -  No data to display No results found.   No diagnosis  found. X-rays viewed by me  MDM  Plan prescription Medrol Dosepak. Patient can take Percocet as needed for pain Strongly doubt Occult hip fracture patient and with or no pain on internal rotation thigh Diagnosis #1 fall 2 contusions multiple sites        Doug Sou, MD 10/11/12 1802

## 2012-12-05 ENCOUNTER — Encounter (HOSPITAL_COMMUNITY): Payer: Self-pay | Admitting: Family Medicine

## 2012-12-05 ENCOUNTER — Emergency Department (HOSPITAL_COMMUNITY)
Admission: EM | Admit: 2012-12-05 | Discharge: 2012-12-05 | Disposition: A | Discharge status: Home / Self Care | Payer: Medicaid Other | Attending: Emergency Medicine | Admitting: Emergency Medicine

## 2012-12-05 ENCOUNTER — Emergency Department (HOSPITAL_COMMUNITY): Payer: Medicaid Other

## 2012-12-05 DIAGNOSIS — M545 Low back pain, unspecified: Secondary | ICD-10-CM | POA: Insufficient documentation

## 2012-12-05 DIAGNOSIS — Y929 Unspecified place or not applicable: Secondary | ICD-10-CM | POA: Insufficient documentation

## 2012-12-05 DIAGNOSIS — M25569 Pain in unspecified knee: Secondary | ICD-10-CM | POA: Insufficient documentation

## 2012-12-05 DIAGNOSIS — Z9889 Other specified postprocedural states: Secondary | ICD-10-CM | POA: Insufficient documentation

## 2012-12-05 DIAGNOSIS — Z880 Allergy status to penicillin: Secondary | ICD-10-CM | POA: Insufficient documentation

## 2012-12-05 DIAGNOSIS — J4489 Other specified chronic obstructive pulmonary disease: Secondary | ICD-10-CM | POA: Insufficient documentation

## 2012-12-05 DIAGNOSIS — G8929 Other chronic pain: Secondary | ICD-10-CM

## 2012-12-05 DIAGNOSIS — I10 Essential (primary) hypertension: Secondary | ICD-10-CM | POA: Insufficient documentation

## 2012-12-05 DIAGNOSIS — M25561 Pain in right knee: Secondary | ICD-10-CM

## 2012-12-05 DIAGNOSIS — M25559 Pain in unspecified hip: Secondary | ICD-10-CM | POA: Insufficient documentation

## 2012-12-05 DIAGNOSIS — S93402A Sprain of unspecified ligament of left ankle, initial encounter: Secondary | ICD-10-CM

## 2012-12-05 DIAGNOSIS — Y939 Activity, unspecified: Secondary | ICD-10-CM | POA: Insufficient documentation

## 2012-12-05 DIAGNOSIS — Z91040 Latex allergy status: Secondary | ICD-10-CM | POA: Insufficient documentation

## 2012-12-05 DIAGNOSIS — Z889 Allergy status to unspecified drugs, medicaments and biological substances status: Secondary | ICD-10-CM | POA: Insufficient documentation

## 2012-12-05 DIAGNOSIS — G4700 Insomnia, unspecified: Secondary | ICD-10-CM | POA: Insufficient documentation

## 2012-12-05 DIAGNOSIS — S93409A Sprain of unspecified ligament of unspecified ankle, initial encounter: Secondary | ICD-10-CM | POA: Insufficient documentation

## 2012-12-05 DIAGNOSIS — M549 Dorsalgia, unspecified: Secondary | ICD-10-CM

## 2012-12-05 DIAGNOSIS — J449 Chronic obstructive pulmonary disease, unspecified: Secondary | ICD-10-CM | POA: Insufficient documentation

## 2012-12-05 DIAGNOSIS — R296 Repeated falls: Secondary | ICD-10-CM | POA: Insufficient documentation

## 2012-12-05 DIAGNOSIS — Z8679 Personal history of other diseases of the circulatory system: Secondary | ICD-10-CM | POA: Insufficient documentation

## 2012-12-05 DIAGNOSIS — F17200 Nicotine dependence, unspecified, uncomplicated: Secondary | ICD-10-CM | POA: Insufficient documentation

## 2012-12-05 DIAGNOSIS — Z792 Long term (current) use of antibiotics: Secondary | ICD-10-CM | POA: Insufficient documentation

## 2012-12-05 DIAGNOSIS — Z8709 Personal history of other diseases of the respiratory system: Secondary | ICD-10-CM | POA: Insufficient documentation

## 2012-12-05 DIAGNOSIS — Z79899 Other long term (current) drug therapy: Secondary | ICD-10-CM | POA: Insufficient documentation

## 2012-12-05 DIAGNOSIS — Z8739 Personal history of other diseases of the musculoskeletal system and connective tissue: Secondary | ICD-10-CM | POA: Insufficient documentation

## 2012-12-05 MED ORDER — MORPHINE SULFATE 4 MG/ML IJ SOLN
6.0000 mg | Freq: Once | INTRAMUSCULAR | Status: AC
  Administered 2012-12-05: 6 mg via INTRAMUSCULAR
  Filled 2012-12-05: qty 2

## 2012-12-05 NOTE — ED Provider Notes (Signed)
History     This chart was scribed for non-physician practitioner working with Loren Racer, MD by Smitty Pluck, ED scribe. This patient was seen in room TR07C/TR07C and the patient's care was started at 5:41 PM.  CSN: [id number]  Arrival date & time 12/05/12  1624    Chief Complaint  Patient presents with  . Back Pain     Patient is a 62 y.o. female presenting with back pain. The history is provided by the patient and medical records. No language interpreter was used.  Back Pain Associated symptoms: no fever and no weakness    HPI Comments: Denise Simmons is a 62 y.o. female with chronic back pain, who presents to the Emergency Department complaining of constant, severe lower back pain radiating to left leg that has been ongoing. Pain is radiated 9/10. She states that she has left foot swelling. She reports having surgery on lumbar spine in April 2013 but did not follow directions afterward and is suppose to have another back surgery. She states she is unable to do physical therapy until her back muscle strength is adequate enough to have repeat surgery. Pt denies fever, chills, nausea, vomiting, diarrhea, weakness, cough, SOB and any other pain. Pt takes percocet without relief of pain. She has taken prednisone in Jan 2014 without relief. She has pain management physician. She states that she has fallen 2x within past couple of months causing her to have right knee pain and left foot pain. She was seen after falls but did not have xrays.     Past Medical History  Diagnosis Date  . Arthritis   . COPD (chronic obstructive pulmonary disease)   . Fibromyalgia   . Hypertension   . Insomnia   . Allergic rhinitis   . Neck pain   . Raynaud's disease     Past Surgical History  Procedure Laterality Date  . Abdominal hysterectomy    . Tubal ligation    . Hemorroidectomy    . Breast biopsy    . Lumbar laminectomy/decompression microdiscectomy  10/09/2011    Procedure: LUMBAR  LAMINECTOMY/DECOMPRESSION MICRODISCECTOMY 1 LEVEL;  Surgeon: Maeola Harman, MD;  Location: [facility];  Service: Neurosurgery;  Laterality: Right;  RIGHT Lumbar four-five foraminotomy with possible microdiskectomy    Family History  Problem Relation Age of Onset  . Rheum arthritis Mother   . Breast cancer Mother   . Breast cancer Sister   . Ovarian cancer Maternal Grandmother   . Rectal cancer Paternal Grandmother     History  Substance Use Topics  . Smoking status: Current Every Day Smoker -- 1.00 packs/day for 25 years    Types: Cigarettes  . Smokeless tobacco: Never Used  . Alcohol Use: No     Comment: Quit in 2001    OB History   Grav Para Term Preterm Abortions TAB SAB Ect Mult Living                  Review of Systems  Constitutional: Negative for fever and chills.  Respiratory: Negative for shortness of breath.   Gastrointestinal: Negative for nausea and vomiting.  Musculoskeletal: Positive for back pain.  Neurological: Negative for weakness.    Allergies  Prednisone; Celebrex; Doxycycline; Estradiol; Fish allergy; Lisinopril; Methotrexate derivatives; Naproxen; Nsaids; Sumatriptan; Latex; Penicillins; and Sulfa antibiotics  Home Medications   Current Outpatient Rx  Name  Route  Sig  Dispense  Refill  . albuterol (PROVENTIL HFA;VENTOLIN HFA) 108 (90 BASE) MCG/ACT inhaler   Inhalation  Inhale 2 puffs into the lungs 3 (three) times daily as needed. For shortness of breath          . amitriptyline (ELAVIL) 150 MG tablet   Oral   Take 150 mg by mouth at bedtime.           . bisoprolol (ZEBETA) 5 MG tablet   Oral   Take 5 mg by mouth daily.         . clotrimazole (MYCELEX) 10 MG troche   Oral   Take 10 mg by mouth 3 (three) times daily.         . cyclobenzaprine (FLEXERIL) 10 MG tablet   Oral   Take 10 mg by mouth 2 (two) times daily as needed for muscle spasms.         . diazepam (VALIUM) 5 MG tablet   Oral   Take 5 mg by mouth daily as  needed for anxiety or sleep.         Marland Kitchen Emollient (CERAVE) CREA   Apply externally   Apply 1 application topically 2 (two) times daily.         Marland Kitchen esomeprazole (NEXIUM) 40 MG capsule   Oral   Take 40 mg by mouth 2 (two) times daily.           . famotidine (PEPCID) 20 MG tablet   Oral   Take 20 mg by mouth 2 (two) times daily. With nexium         . fluconazole (DIFLUCAN) 100 MG tablet   Oral   Take 100 mg by mouth daily.         . fluocinonide cream (LIDEX) 0.05 %   Topical   Apply 1 application topically 2 (two) times daily.         . furosemide (LASIX) 20 MG tablet   Oral   Take 20 mg by mouth daily.         . hydrocortisone (ANUSOL-HC) 2.5 % rectal cream   Topical   Apply 1 application topically 2 (two) times daily as needed (itching).         . montelukast (SINGULAIR) 10 MG tablet   Oral   Take 10 mg by mouth at bedtime.           . Olopatadine HCl (PATADAY) 0.2 % SOLN   Both Eyes   Place 1 drop into both eyes 2 (two) times daily.          Marland Kitchen oxyCODONE-acetaminophen (PERCOCET) 10-325 MG per tablet   Oral   Take 0.5 tablets by mouth every 6 (six) hours as needed. For pain   12 tablet   0   . pregabalin (LYRICA) 50 MG capsule   Oral   Take 50 mg by mouth 2 (two) times daily.         Marland Kitchen topiramate (TOPAMAX) 50 MG tablet   Oral   Take 50 mg by mouth 2 (two) times daily.          Marland Kitchen triamcinolone cream (KENALOG) 0.1 %   Topical   Apply topically 3 (three) times daily.   454 g   2   . zolpidem (AMBIEN) 10 MG tablet   Oral   Take 10 mg by mouth at bedtime as needed for sleep.            BP 145/79  Pulse 59  Temp(Src) 98.4 F (36.9 C)  Resp 18  SpO2 97%  Physical Exam  Nursing note and vitals  reviewed. Constitutional: She is oriented to person, place, and time. She appears well-developed and well-nourished. No distress.  HENT:  Head: Normocephalic and atraumatic.  Eyes: EOM are normal.  Neck: Neck supple. No tracheal  deviation present.  Cardiovascular: Normal rate, regular rhythm and normal heart sounds.   Pulmonary/Chest: Effort normal and breath sounds normal. No respiratory distress.  Musculoskeletal: Normal range of motion.  Swelling localized to left ankle Tenderness of left hip Left hip stable with nl ROM Generalized tenderness across the left ankle:neurovascularly intact  Neurological: She is alert and oriented to person, place, and time.  Skin: Skin is warm and dry.  Psychiatric: She has a normal mood and affect. Her behavior is normal.    ED Course  Procedures (including critical care time) DIAGNOSTIC STUDIES: Oxygen Saturation is 97% on room air, normal by my interpretation.    COORDINATION OF CARE: 5:47 PM Discussed ED treatment with pt and pt agrees.     Labs Reviewed - No data to display Dg Ankle Complete Left  12/05/2012   *RADIOLOGY REPORT*  Clinical Data: Multiple recent falls, left ankle pain/swelling  LEFT ANKLE COMPLETE - 3+ VIEW  Comparison: 10/11/2012  Findings: Possible cortical irregularity along the superior talar neck, only visualized on the lateral view.  Otherwise, no evidence of fracture or dislocation.  Ankle mortise is intact.  The base of the fifth metatarsal is unremarkable.  Moderate diffuse soft tissue swelling, increased.  IMPRESSION: Possible cortical irregularity along the superior talar neck.  Correlate with the site of the patient's pain to exclude acute fracture.  Moderate diffuse soft tissue swelling.   Original Report Authenticated By: Charline Bills, M.D.   Dg Knee Complete 4 Views Right  12/05/2012   *RADIOLOGY REPORT*  Clinical Data: Right knee pain/swelling, several recent falls  RIGHT KNEE - COMPLETE 4+ VIEW  Comparison: None.  Findings: No fracture or dislocation is seen.  Mild tricompartmental degenerative changes, most prominent in the medial compartment.  The visualized soft tissues are unremarkable.  No suprapatellar knee joint effusion.  IMPRESSION:  No fracture or dislocation is seen.  Mild degenerative changes.   Original Report Authenticated By: Charline Bills, M.D.     1. Ankle sprain, left, initial encounter   2. Chronic back pain   3. Knee pain, right       MDM  Finding with ankle likely related to previous injury as pt states that she has floating bone:pt is under chronic pain management:pt placed in ace wrap for comfort     I personally performed the services described in this documentation, which was scribed in my presence. The recorded information has been reviewed and is accurate.   Teressa Lower, NP 12/05/12 1912

## 2012-12-05 NOTE — ED Notes (Signed)
Pt sts lower back pain radiating to right hip and leg.

## 2012-12-06 NOTE — ED Provider Notes (Signed)
Medical screening examination/treatment/procedure(s) were performed by non-physician practitioner and as supervising physician I was immediately available for consultation/collaboration.   Kaeya Schiffer, MD 12/06/12 0221 

## 2012-12-10 ENCOUNTER — Encounter (HOSPITAL_COMMUNITY): Payer: Self-pay | Admitting: Emergency Medicine

## 2012-12-10 ENCOUNTER — Emergency Department (HOSPITAL_COMMUNITY)
Admission: EM | Admit: 2012-12-10 | Discharge: 2012-12-11 | Disposition: A | Discharge status: Home / Self Care | Payer: Medicaid Other | Attending: Emergency Medicine | Admitting: Emergency Medicine

## 2012-12-10 ENCOUNTER — Emergency Department (HOSPITAL_COMMUNITY): Payer: Medicaid Other

## 2012-12-10 DIAGNOSIS — IMO0001 Reserved for inherently not codable concepts without codable children: Secondary | ICD-10-CM | POA: Insufficient documentation

## 2012-12-10 DIAGNOSIS — F17200 Nicotine dependence, unspecified, uncomplicated: Secondary | ICD-10-CM | POA: Insufficient documentation

## 2012-12-10 DIAGNOSIS — Z79899 Other long term (current) drug therapy: Secondary | ICD-10-CM | POA: Insufficient documentation

## 2012-12-10 DIAGNOSIS — I7300 Raynaud's syndrome without gangrene: Secondary | ICD-10-CM | POA: Insufficient documentation

## 2012-12-10 DIAGNOSIS — S0990XA Unspecified injury of head, initial encounter: Secondary | ICD-10-CM

## 2012-12-10 DIAGNOSIS — Z8739 Personal history of other diseases of the musculoskeletal system and connective tissue: Secondary | ICD-10-CM | POA: Insufficient documentation

## 2012-12-10 DIAGNOSIS — J4489 Other specified chronic obstructive pulmonary disease: Secondary | ICD-10-CM | POA: Insufficient documentation

## 2012-12-10 DIAGNOSIS — R5381 Other malaise: Secondary | ICD-10-CM | POA: Insufficient documentation

## 2012-12-10 DIAGNOSIS — Y9389 Activity, other specified: Secondary | ICD-10-CM | POA: Insufficient documentation

## 2012-12-10 DIAGNOSIS — M129 Arthropathy, unspecified: Secondary | ICD-10-CM | POA: Insufficient documentation

## 2012-12-10 DIAGNOSIS — I10 Essential (primary) hypertension: Secondary | ICD-10-CM | POA: Insufficient documentation

## 2012-12-10 DIAGNOSIS — W06XXXA Fall from bed, initial encounter: Secondary | ICD-10-CM | POA: Insufficient documentation

## 2012-12-10 DIAGNOSIS — S0003XA Contusion of scalp, initial encounter: Secondary | ICD-10-CM | POA: Insufficient documentation

## 2012-12-10 DIAGNOSIS — Y92009 Unspecified place in unspecified non-institutional (private) residence as the place of occurrence of the external cause: Secondary | ICD-10-CM | POA: Insufficient documentation

## 2012-12-10 DIAGNOSIS — J449 Chronic obstructive pulmonary disease, unspecified: Secondary | ICD-10-CM | POA: Insufficient documentation

## 2012-12-10 DIAGNOSIS — R55 Syncope and collapse: Secondary | ICD-10-CM | POA: Insufficient documentation

## 2012-12-10 DIAGNOSIS — S0083XA Contusion of other part of head, initial encounter: Secondary | ICD-10-CM

## 2012-12-10 LAB — POCT I-STAT, CHEM 8
BUN: <3 mg/dL — ABNORMAL LOW (ref 6–23)
Calcium, Ion: 1.26 mmol/L (ref 1.13–1.30)
Chloride: 106 mEq/L (ref 96–112)
Creatinine, Ser: 0.8 mg/dL (ref 0.50–1.10)
Glucose, Bld: 97 mg/dL (ref 70–99)
HCT: 41 % (ref 36.0–46.0)
Hemoglobin: 13.9 g/dL (ref 12.0–15.0)
Potassium: 3.6 mEq/L (ref 3.5–5.1)
Sodium: 142 mEq/L (ref 135–145)
TCO2: 29 mmol/L (ref 0–100)

## 2012-12-10 LAB — CBC WITH DIFFERENTIAL/PLATELET
Basophils Absolute: 0 10*3/uL (ref 0.0–0.1)
Basophils Relative: 0 % (ref 0–1)
Eosinophils Absolute: 0.1 10*3/uL (ref 0.0–0.7)
Eosinophils Relative: 2 % (ref 0–5)
HCT: 40.3 % (ref 36.0–46.0)
Hemoglobin: 13.7 g/dL (ref 12.0–15.0)
Lymphocytes Relative: 53 % — ABNORMAL HIGH (ref 12–46)
Lymphs Abs: 3.5 10*3/uL (ref 0.7–4.0)
MCH: 27.8 pg (ref 26.0–34.0)
MCHC: 34 g/dL (ref 30.0–36.0)
MCV: 81.7 fL (ref 78.0–100.0)
Monocytes Absolute: 0.6 10*3/uL (ref 0.1–1.0)
Monocytes Relative: 9 % (ref 3–12)
Neutro Abs: 2.4 10*3/uL (ref 1.7–7.7)
Neutrophils Relative %: 36 % — ABNORMAL LOW (ref 43–77)
Platelets: 146 10*3/uL — ABNORMAL LOW (ref 150–400)
RBC: 4.93 MIL/uL (ref 3.87–5.11)
RDW: 13.8 % (ref 11.5–15.5)
WBC: 6.6 10*3/uL (ref 4.0–10.5)

## 2012-12-10 MED ORDER — HYDROMORPHONE HCL PF 1 MG/ML IJ SOLN
1.0000 mg | Freq: Once | INTRAMUSCULAR | Status: AC
  Administered 2012-12-10: 1 mg via INTRAVENOUS
  Filled 2012-12-10: qty 1

## 2012-12-10 MED ORDER — FENTANYL CITRATE 0.05 MG/ML IJ SOLN
100.0000 ug | Freq: Once | INTRAMUSCULAR | Status: AC
  Administered 2012-12-10: 100 ug via INTRAVENOUS
  Filled 2012-12-10: qty 2

## 2012-12-10 MED ORDER — METOCLOPRAMIDE HCL 5 MG/ML IJ SOLN
10.0000 mg | Freq: Once | INTRAMUSCULAR | Status: AC
  Administered 2012-12-10: 10 mg via INTRAVENOUS
  Filled 2012-12-10: qty 2

## 2012-12-10 MED ORDER — DIPHENHYDRAMINE HCL 50 MG/ML IJ SOLN
25.0000 mg | Freq: Once | INTRAMUSCULAR | Status: AC
  Administered 2012-12-10: 25 mg via INTRAVENOUS
  Filled 2012-12-10: qty 1

## 2012-12-10 NOTE — ED Provider Notes (Signed)
History     CSN: [id number]  Arrival date & time 12/10/12  1739   First MD Initiated Contact with Patient 12/10/12 1752      Chief Complaint  Patient presents with  . Fall  . Facial Injury  . Hand Pain    left hand  syncope  (Consider location/radiation/quality/duration/timing/severity/associated sxs/prior treatment) HPI 62 year old female lives alone at home she has baseline chronic back pain and chronic generalized pain to all 4 extremities from fibromyalgia, and she uses a walker at times from baseline generalized weakness, this morning she woke up at about 8:00 and started to get out of bed then the last thing she remembers she felt light-headed then fell waking up on the floor having struck her face on the floor with some pain to her left malar eminence some pain to her left anterior upper chest wall from hitting the floor which is been constant all day she climbed back into her bed slept until this afternoon woke up and started driving herself to [employer] when she felt lightheaded with near syncope again so she pulled over to the side of the road called her family who brought her to the emergency department, she is no chest pain no shortness breath no altered mental status she now has a headache all day from hitting her head on the floor this morning, she has mild neck pain, she has baseline back pain unchanged, she had transient pain to her left hand small finger which is now resolved, she is no focal or lateralizing weakness or numbness, she is no abdominal pain, she is no vomiting, she is no bloody stools, she is no fever, there is no treatment prior to arrival. Past Medical History  Diagnosis Date  . Arthritis   . COPD (chronic obstructive pulmonary disease)   . Fibromyalgia   . Hypertension   . Insomnia   . Allergic rhinitis   . Neck pain   . Raynaud's disease     Past Surgical History  Procedure Laterality Date  . Abdominal hysterectomy    . Tubal ligation    .  Hemorroidectomy    . Breast biopsy    . Lumbar laminectomy/decompression microdiscectomy  10/09/2011    Procedure: LUMBAR LAMINECTOMY/DECOMPRESSION MICRODISCECTOMY 1 LEVEL;  Surgeon: Maeola Harman, MD;  Location: [facility];  Service: Neurosurgery;  Laterality: Right;  RIGHT Lumbar four-five foraminotomy with possible microdiskectomy    Family History  Problem Relation Age of Onset  . Rheum arthritis Mother   . Breast cancer Mother   . Breast cancer Sister   . Ovarian cancer Maternal Grandmother   . Rectal cancer Paternal Grandmother     History  Substance Use Topics  . Smoking status: Current Every Day Smoker -- 1.00 packs/day for 25 years    Types: Cigarettes  . Smokeless tobacco: Never Used  . Alcohol Use: No     Comment: Quit in 2001    OB History   Grav Para Term Preterm Abortions TAB SAB Ect Mult Living                  Review of Systems 10 Systems reviewed and are negative for acute change except as noted in the HPI. Allergies  Prednisone; Celebrex; Doxycycline; Estradiol; Fish allergy; Lisinopril; Methotrexate derivatives; Naproxen; Nsaids; Sumatriptan; Latex; Penicillins; and Sulfa antibiotics  Home Medications   Current Outpatient Rx  Name  Route  Sig  Dispense  Refill  . adalimumab (HUMIRA PEN) 40 MG/0.8ML injection   Subcutaneous  Inject 40 mg into the skin every 14 (fourteen) days.         Marland Kitchen albuterol (PROVENTIL HFA;VENTOLIN HFA) 108 (90 BASE) MCG/ACT inhaler   Inhalation   Inhale 2 puffs into the lungs 3 (three) times daily as needed. For shortness of breath          . amitriptyline (ELAVIL) 150 MG tablet   Oral   Take 150 mg by mouth at bedtime.           . bisoprolol (ZEBETA) 5 MG tablet   Oral   Take 5 mg by mouth daily.         . clotrimazole (MYCELEX) 10 MG troche   Oral   Take 10 mg by mouth 3 (three) times daily.         . cyclobenzaprine (FLEXERIL) 10 MG tablet   Oral   Take 10 mg by mouth 2 (two) times daily as needed for  muscle spasms.         . diazepam (VALIUM) 5 MG tablet   Oral   Take 5 mg by mouth daily as needed for anxiety or sleep.         Marland Kitchen Emollient (CERAVE) CREA   Apply externally   Apply 1 application topically 2 (two) times daily.         Marland Kitchen EPINEPHrine (EPI-PEN) 0.3 mg/0.3 mL DEVI   Intramuscular   Inject 0.3 mg into the muscle as needed (allergic reaction).         Marland Kitchen esomeprazole (NEXIUM) 40 MG capsule   Oral   Take 40 mg by mouth 2 (two) times daily.           . famotidine (PEPCID) 20 MG tablet   Oral   Take 20 mg by mouth at bedtime. With nexium         . fluconazole (DIFLUCAN) 100 MG tablet   Oral   Take 100 mg by mouth once a week. Takes on Monday         . fluocinonide cream (LIDEX) 0.05 %   Topical   Apply 1 application topically 2 (two) times daily.         . furosemide (LASIX) 20 MG tablet   Oral   Take 20 mg by mouth daily.         . hydrocortisone (ANUSOL-HC) 2.5 % rectal cream   Topical   Apply 1 application topically 2 (two) times daily as needed (itching).         . montelukast (SINGULAIR) 10 MG tablet   Oral   Take 10 mg by mouth at bedtime.           . Olopatadine HCl (PATADAY) 0.2 % SOLN   Both Eyes   Place 1 drop into both eyes 2 (two) times daily.          Marland Kitchen oxyCODONE-acetaminophen (PERCOCET) 10-325 MG per tablet   Oral   Take 0.5 tablets by mouth every 6 (six) hours as needed. For pain   12 tablet   0   . pregabalin (LYRICA) 50 MG capsule   Oral   Take 50 mg by mouth 2 (two) times daily.         Marland Kitchen topiramate (TOPAMAX) 50 MG tablet   Oral   Take 50 mg by mouth 2 (two) times daily.          Marland Kitchen triamcinolone cream (KENALOG) 0.1 %   Topical   Apply topically 3 (three) times daily.  454 g   2   . zolpidem (AMBIEN) 10 MG tablet   Oral   Take 10 mg by mouth at bedtime as needed for sleep.            BP 155/80  Pulse 60  Temp(Src) 98.6 F (37 C) (Oral)  Resp 16  SpO2 99%  Physical Exam  Nursing note  and vitals reviewed. Constitutional: She is oriented to person, place, and time.  Awake, alert, nontoxic appearance with baseline speech for patient.  HENT:  Mouth/Throat: No oropharyngeal exudate.  Mildly tender left malar eminence, normal jaw occlusion, TMJs nontender  Eyes: EOM are normal. Pupils are equal, round, and reactive to light. Right eye exhibits no discharge. Left eye exhibits no discharge.  Neck:  Minimal cervical spine tenderness  Cardiovascular: Normal rate and regular rhythm.   No murmur heard. Pulmonary/Chest: Effort normal and breath sounds normal. No stridor. No respiratory distress. She has no wheezes. She has no rales. She exhibits tenderness.  reproducable tenderness mild bilateral upper chest wall  Abdominal: Soft. Bowel sounds are normal. She exhibits no mass. There is no tenderness. There is no rebound.  Musculoskeletal: She exhibits tenderness. She exhibits no edema.  Baseline ROM, moves extremities with no obvious new focal weakness. Back has baseline lumbar and paralumbar tenderness only all 4 extremities are nontender.  Lymphadenopathy:    She has no cervical adenopathy.  Neurological: She is alert and oriented to person, place, and time.  Awake, alert, cooperative and aware of situation; motor strength bilaterally; sensation normal to light touch bilaterally; peripheral visual fields full to confrontation; no facial asymmetry; tongue midline; major cranial nerves appear intact; no pronator drift, normal finger to nose bilaterally  Skin: No rash noted.  Psychiatric: She has a normal mood and affect.    ED Course  Procedures (including critical care time) ECG: Sinus rhythm, ventricular rate 54, normal axis, inferior inverted T waves, no significant change noted compared with November 2012  Patient understand and agree with initial ED impression and plan with expectations set for ED visit.  Pt stable in ED with no significant deterioration in condition.  Headache resolved with medications. Family and patient agree with assessment and plan, patient appears to be low risk syncope and stable for discharge.  Labs Reviewed  CBC WITH DIFFERENTIAL - Abnormal; Notable for the following:    Platelets 146 (*)    Neutrophils Relative % 36 (*)    Lymphocytes Relative 53 (*)    All other components within normal limits  POCT I-STAT, CHEM 8 - Abnormal; Notable for the following:    BUN <3 (*)    All other components within normal limits   Dg Chest 2 View  12/10/2012   *RADIOLOGY REPORT*  Clinical Data: Fall.  Chest pain.  CHEST - 2 VIEW  Comparison: 04/23/2012.  Findings: No obvious fracture or pneumothorax.  Tortuous aorta.  Cardiomegaly.  Central pulmonary vascular prominence.  The patient would eventually benefit from follow-up two-view chest with cardiac leads removed.  IMPRESSION:  No obvious fracture or pneumothorax.  Tortuous aorta.  Cardiomegaly.  Central pulmonary vascular prominence.   Original Report Authenticated By: Lacy Duverney, M.D.   Ct Head Wo Contrast  12/10/2012   *RADIOLOGY REPORT*  Clinical Data:  Frontal headache, left facial pain and posterior neck pain following a fall.  CT HEAD WITHOUT CONTRAST CT MAXILLOFACIAL WITHOUT CONTRAST CT CERVICAL SPINE WITHOUT CONTRAST  Technique:  Multidetector CT imaging of the head, cervical spine, and maxillofacial structures  were performed using the standard protocol without intravenous contrast. Multiplanar CT image reconstructions of the cervical spine and maxillofacial structures were also generated.  Comparison:  Brain MR dated 08/20/2011.  CT HEAD  Findings: Minimally enlarged ventricles and subarachnoid spaces. Minimal patchy white matter low density in both cerebral hemispheres.  No skull fracture, intracranial hemorrhage or paranasal sinus air-fluid levels.  Mild bilateral ethmoid sinus mucosal thickening.  IMPRESSION:  1.  No skull fracture or intracranial hemorrhage. 2.  Minimal atrophy and minimal  chronic small vessel white matter ischemic changes in both cerebral hemispheres.  CT MAXILLOFACIAL  Findings:  Mild anterior subluxation of both mandibular condyles. The patient is edentulous.  No fractures or paranasal sinus air- fluid levels.  IMPRESSION:  1.  No fracture. 2.  Mild anterior subluxation of both mandibular condyles.  CT CERVICAL SPINE  Findings:   Mild dextroconvex scoliosis.  Reversal of the normal cervical lordosis.  Facet degenerative changes, anterior spur formation and posterior spur formation at multiple levels.  No prevertebral soft tissue swelling, fractures or subluxations.  IMPRESSION:  1.  No fracture or subluxation. 2.  Multilevel degenerative changes. 3.  Reversal of the normal cervical lordosis and mild scoliosis.   Original Report Authenticated By: Beckie Salts, M.D.   Ct Cervical Spine Wo Contrast  12/10/2012   *RADIOLOGY REPORT*  Clinical Data:  Frontal headache, left facial pain and posterior neck pain following a fall.  CT HEAD WITHOUT CONTRAST CT MAXILLOFACIAL WITHOUT CONTRAST CT CERVICAL SPINE WITHOUT CONTRAST  Technique:  Multidetector CT imaging of the head, cervical spine, and maxillofacial structures were performed using the standard protocol without intravenous contrast. Multiplanar CT image reconstructions of the cervical spine and maxillofacial structures were also generated.  Comparison:  Brain MR dated 08/20/2011.  CT HEAD  Findings: Minimally enlarged ventricles and subarachnoid spaces. Minimal patchy white matter low density in both cerebral hemispheres.  No skull fracture, intracranial hemorrhage or paranasal sinus air-fluid levels.  Mild bilateral ethmoid sinus mucosal thickening.  IMPRESSION:  1.  No skull fracture or intracranial hemorrhage. 2.  Minimal atrophy and minimal chronic small vessel white matter ischemic changes in both cerebral hemispheres.  CT MAXILLOFACIAL  Findings:  Mild anterior subluxation of both mandibular condyles. The patient is edentulous.   No fractures or paranasal sinus air- fluid levels.  IMPRESSION:  1.  No fracture. 2.  Mild anterior subluxation of both mandibular condyles.  CT CERVICAL SPINE  Findings:   Mild dextroconvex scoliosis.  Reversal of the normal cervical lordosis.  Facet degenerative changes, anterior spur formation and posterior spur formation at multiple levels.  No prevertebral soft tissue swelling, fractures or subluxations.  IMPRESSION:  1.  No fracture or subluxation. 2.  Multilevel degenerative changes. 3.  Reversal of the normal cervical lordosis and mild scoliosis.   Original Report Authenticated By: Beckie Salts, M.D.   Ct Maxillofacial Wo Cm  12/10/2012   *RADIOLOGY REPORT*  Clinical Data:  Frontal headache, left facial pain and posterior neck pain following a fall.  CT HEAD WITHOUT CONTRAST CT MAXILLOFACIAL WITHOUT CONTRAST CT CERVICAL SPINE WITHOUT CONTRAST  Technique:  Multidetector CT imaging of the head, cervical spine, and maxillofacial structures were performed using the standard protocol without intravenous contrast. Multiplanar CT image reconstructions of the cervical spine and maxillofacial structures were also generated.  Comparison:  Brain MR dated 08/20/2011.  CT HEAD  Findings: Minimally enlarged ventricles and subarachnoid spaces. Minimal patchy white matter low density in both cerebral hemispheres.  No skull fracture, intracranial hemorrhage  or paranasal sinus air-fluid levels.  Mild bilateral ethmoid sinus mucosal thickening.  IMPRESSION:  1.  No skull fracture or intracranial hemorrhage. 2.  Minimal atrophy and minimal chronic small vessel white matter ischemic changes in both cerebral hemispheres.  CT MAXILLOFACIAL  Findings:  Mild anterior subluxation of both mandibular condyles. The patient is edentulous.  No fractures or paranasal sinus air- fluid levels.  IMPRESSION:  1.  No fracture. 2.  Mild anterior subluxation of both mandibular condyles.  CT CERVICAL SPINE  Findings:   Mild dextroconvex  scoliosis.  Reversal of the normal cervical lordosis.  Facet degenerative changes, anterior spur formation and posterior spur formation at multiple levels.  No prevertebral soft tissue swelling, fractures or subluxations.  IMPRESSION:  1.  No fracture or subluxation. 2.  Multilevel degenerative changes. 3.  Reversal of the normal cervical lordosis and mild scoliosis.   Original Report Authenticated By: Beckie Salts, M.D.     1. Syncope   2. Minor head injury, initial encounter   3. Facial contusion, initial encounter       MDM  I doubt any other EMC precluding discharge at this time including, but not necessarily limited to the following:[facility]tach.        Hurman Horn, MD 12/11/12 [phone]

## 2012-12-10 NOTE — ED Notes (Signed)
Patient to ed via family. States she got oob this morning around 0750 to go to the restroom(not using her walker) and fell hitting her head on the left frontal area on the floor. She states she remembers falling and using her walker to help her get out of the floor and back into bed. States she went back to bed and got up again around 11 am. States she tried to go to the bathroom but was unsuccessful so she decided to go to [employer]. States while driving to [employer] she became very nauseated and got a headache ( has hx of migraine). Called her son and her grandson came and got her and they brought her to the hospital. . Pt is nauseated and vomiting up small amt emesis. States has a frontal headache that feels like on of her usual migraines. States took topromate before coming to the hospital. Pt states she uses a walker because of her chronic back pain/issues.

## 2012-12-10 NOTE — ED Notes (Addendum)
Pt reports went to stand up and fell. Pt denies +LOC reports, "I just got up and went back to sleep." Pt later attempted to drive to [facility] but became dizziness and vomited. Pt reports that she trip and fell yesterday, denies +LOC.

## 2012-12-10 NOTE — ED Provider Notes (Signed)
Pt not in room 1800.  Hurman Horn, MD 12/10/12 [phone]

## 2012-12-28 ENCOUNTER — Ambulatory Visit: Payer: Medicaid Other | Admitting: Internal Medicine

## 2013-01-10 ENCOUNTER — Other Ambulatory Visit: Payer: Self-pay | Admitting: Neurosurgery

## 2013-01-10 DIAGNOSIS — M5126 Other intervertebral disc displacement, lumbar region: Secondary | ICD-10-CM

## 2013-01-12 ENCOUNTER — Ambulatory Visit: Payer: Medicaid Other | Admitting: Internal Medicine

## 2013-01-13 ENCOUNTER — Ambulatory Visit
Admission: RE | Admit: 2013-01-13 | Discharge: 2013-01-13 | Disposition: A | Discharge status: Home / Self Care | Payer: Medicaid Other | Source: Ambulatory Visit | Attending: Neurosurgery | Admitting: Neurosurgery

## 2013-01-13 DIAGNOSIS — M5126 Other intervertebral disc displacement, lumbar region: Secondary | ICD-10-CM

## 2013-01-13 MED ORDER — GADOBENATE DIMEGLUMINE 529 MG/ML IV SOLN
18.0000 mL | Freq: Once | INTRAVENOUS | Status: AC | PRN
  Administered 2013-01-13: 18 mL via INTRAVENOUS

## 2013-01-19 ENCOUNTER — Emergency Department (HOSPITAL_COMMUNITY): Payer: Medicaid Other

## 2013-01-19 ENCOUNTER — Encounter (HOSPITAL_COMMUNITY): Payer: Self-pay | Admitting: Family Medicine

## 2013-01-19 ENCOUNTER — Emergency Department (HOSPITAL_COMMUNITY)
Admission: EM | Admit: 2013-01-19 | Discharge: 2013-01-19 | Disposition: A | Discharge status: Home / Self Care | Payer: Medicaid Other | Attending: Emergency Medicine | Admitting: Emergency Medicine

## 2013-01-19 DIAGNOSIS — F17200 Nicotine dependence, unspecified, uncomplicated: Secondary | ICD-10-CM | POA: Insufficient documentation

## 2013-01-19 DIAGNOSIS — R1310 Dysphagia, unspecified: Secondary | ICD-10-CM | POA: Insufficient documentation

## 2013-01-19 DIAGNOSIS — R110 Nausea: Secondary | ICD-10-CM | POA: Insufficient documentation

## 2013-01-19 DIAGNOSIS — J029 Acute pharyngitis, unspecified: Secondary | ICD-10-CM | POA: Insufficient documentation

## 2013-01-19 DIAGNOSIS — M542 Cervicalgia: Secondary | ICD-10-CM | POA: Insufficient documentation

## 2013-01-19 DIAGNOSIS — Z79899 Other long term (current) drug therapy: Secondary | ICD-10-CM | POA: Insufficient documentation

## 2013-01-19 DIAGNOSIS — G4700 Insomnia, unspecified: Secondary | ICD-10-CM | POA: Insufficient documentation

## 2013-01-19 DIAGNOSIS — K222 Esophageal obstruction: Secondary | ICD-10-CM

## 2013-01-19 DIAGNOSIS — R05 Cough: Secondary | ICD-10-CM | POA: Insufficient documentation

## 2013-01-19 DIAGNOSIS — J4489 Other specified chronic obstructive pulmonary disease: Secondary | ICD-10-CM | POA: Insufficient documentation

## 2013-01-19 DIAGNOSIS — Z8679 Personal history of other diseases of the circulatory system: Secondary | ICD-10-CM | POA: Insufficient documentation

## 2013-01-19 DIAGNOSIS — R059 Cough, unspecified: Secondary | ICD-10-CM | POA: Insufficient documentation

## 2013-01-19 DIAGNOSIS — I10 Essential (primary) hypertension: Secondary | ICD-10-CM

## 2013-01-19 DIAGNOSIS — R5381 Other malaise: Secondary | ICD-10-CM | POA: Insufficient documentation

## 2013-01-19 DIAGNOSIS — Z880 Allergy status to penicillin: Secondary | ICD-10-CM | POA: Insufficient documentation

## 2013-01-19 DIAGNOSIS — R509 Fever, unspecified: Secondary | ICD-10-CM | POA: Insufficient documentation

## 2013-01-19 DIAGNOSIS — R109 Unspecified abdominal pain: Secondary | ICD-10-CM | POA: Insufficient documentation

## 2013-01-19 DIAGNOSIS — J449 Chronic obstructive pulmonary disease, unspecified: Secondary | ICD-10-CM

## 2013-01-19 DIAGNOSIS — Z8739 Personal history of other diseases of the musculoskeletal system and connective tissue: Secondary | ICD-10-CM | POA: Insufficient documentation

## 2013-01-19 DIAGNOSIS — B349 Viral infection, unspecified: Secondary | ICD-10-CM

## 2013-01-19 DIAGNOSIS — Z91040 Latex allergy status: Secondary | ICD-10-CM | POA: Insufficient documentation

## 2013-01-19 DIAGNOSIS — B338 Other specified viral diseases: Secondary | ICD-10-CM | POA: Insufficient documentation

## 2013-01-19 LAB — POCT I-STAT, CHEM 8
BUN: 3 mg/dL — ABNORMAL LOW (ref 6–23)
Calcium, Ion: 1.24 mmol/L (ref 1.13–1.30)
Chloride: 104 mEq/L (ref 96–112)
Creatinine, Ser: 0.9 mg/dL (ref 0.50–1.10)
Glucose, Bld: 111 mg/dL — ABNORMAL HIGH (ref 70–99)
HCT: 47 % — ABNORMAL HIGH (ref 36.0–46.0)
Hemoglobin: 16 g/dL — ABNORMAL HIGH (ref 12.0–15.0)
Potassium: 4.1 mEq/L (ref 3.5–5.1)
Sodium: 139 mEq/L (ref 135–145)
TCO2: 24 mmol/L (ref 0–100)

## 2013-01-19 LAB — URINE MICROSCOPIC-ADD ON

## 2013-01-19 LAB — URINALYSIS, ROUTINE W REFLEX MICROSCOPIC
Bilirubin Urine: NEGATIVE
Glucose, UA: NEGATIVE mg/dL
Ketones, ur: NEGATIVE mg/dL
Leukocytes, UA: NEGATIVE
Nitrite: NEGATIVE
Protein, ur: NEGATIVE mg/dL
Specific Gravity, Urine: 1.01 (ref 1.005–1.030)
Urobilinogen, UA: 0.2 mg/dL (ref 0.0–1.0)
pH: 8 (ref 5.0–8.0)

## 2013-01-19 LAB — RAPID STREP SCREEN (MED CTR MEBANE ONLY): Streptococcus, Group A Screen (Direct): NEGATIVE

## 2013-01-19 LAB — CBC WITH DIFFERENTIAL/PLATELET
Basophils Absolute: 0 10*3/uL (ref 0.0–0.1)
Basophils Relative: 0 % (ref 0–1)
Eosinophils Absolute: 0.1 10*3/uL (ref 0.0–0.7)
Eosinophils Relative: 1 % (ref 0–5)
HCT: 43.5 % (ref 36.0–46.0)
Hemoglobin: 14.5 g/dL (ref 12.0–15.0)
Lymphocytes Relative: 27 % (ref 12–46)
Lymphs Abs: 2.4 10*3/uL (ref 0.7–4.0)
MCH: 27.2 pg (ref 26.0–34.0)
MCHC: 33.3 g/dL (ref 30.0–36.0)
MCV: 81.5 fL (ref 78.0–100.0)
Monocytes Absolute: 0.9 10*3/uL (ref 0.1–1.0)
Monocytes Relative: 10 % (ref 3–12)
Neutro Abs: 5.5 10*3/uL (ref 1.7–7.7)
Neutrophils Relative %: 62 % (ref 43–77)
Platelets: 127 10*3/uL — ABNORMAL LOW (ref 150–400)
RBC: 5.34 MIL/uL — ABNORMAL HIGH (ref 3.87–5.11)
RDW: 13.4 % (ref 11.5–15.5)
WBC: 8.9 10*3/uL (ref 4.0–10.5)

## 2013-01-19 LAB — CG4 I-STAT (LACTIC ACID)
Lactic Acid, Venous: 1.24 mmol/L (ref 0.5–2.2)
Lactic Acid, Venous: 1.41 mmol/L (ref 0.5–2.2)

## 2013-01-19 LAB — LIPASE, BLOOD: Lipase: 16 U/L (ref 11–59)

## 2013-01-19 MED ORDER — ACETAMINOPHEN 160 MG/5ML PO SOLN
650.0000 mg | Freq: Four times a day (QID) | ORAL | Status: DC | PRN
  Administered 2013-01-19: 650 mg via ORAL
  Filled 2013-01-19: qty 20.3

## 2013-01-19 MED ORDER — MORPHINE SULFATE 4 MG/ML IJ SOLN
4.0000 mg | Freq: Once | INTRAMUSCULAR | Status: AC
  Administered 2013-01-19: 4 mg via INTRAVENOUS
  Filled 2013-01-19: qty 1

## 2013-01-19 MED ORDER — ACETAMINOPHEN 325 MG PO TABS: 650.0000 mg | ORAL_TABLET | Freq: Once | ORAL | Status: DC

## 2013-01-19 MED ORDER — OXYCODONE-ACETAMINOPHEN 5-325 MG PO TABS: 1.0000 | ORAL_TABLET | Freq: Three times a day (TID) | ORAL | Status: DC | PRN

## 2013-01-19 MED ORDER — ONDANSETRON HCL 4 MG/2ML IJ SOLN
4.0000 mg | Freq: Once | INTRAMUSCULAR | Status: AC
  Administered 2013-01-19: 4 mg via INTRAVENOUS
  Filled 2013-01-19: qty 2

## 2013-01-19 MED ORDER — ACETAMINOPHEN 160 MG/5ML PO SOLN: 325.0000 mg | Freq: Four times a day (QID) | ORAL | Status: DC | PRN

## 2013-01-19 MED ORDER — SODIUM CHLORIDE 0.9 % IV BOLUS (SEPSIS)
1000.0000 mL | Freq: Once | INTRAVENOUS | Status: AC
  Administered 2013-01-19: 1000 mL via INTRAVENOUS

## 2013-01-19 NOTE — ED Notes (Signed)
Per pt sts since last night she has been having throat pain and trouble swallowing. sts was seen at her doctor yesterday for the same. sts the pain goes all the way to her stomach and she has some nausea.

## 2013-01-19 NOTE — ED Notes (Signed)
Lactic acid results given to Dr. Nanavati 

## 2013-01-19 NOTE — ED Notes (Signed)
Patient transported to Ultrasound 

## 2013-01-19 NOTE — ED Provider Notes (Signed)
History    CSN: [id number] Arrival date & time 01/19/13  1037  First MD Initiated Contact with Patient 01/19/13 1108     Chief Complaint  Patient presents with  . swallowing issues    (Consider location/radiation/quality/duration/timing/severity/associated sxs/prior Treatment) The history is provided by the patient and a relative. No language interpreter was used.  Denise Simmons is a 62 y/o F with PMhx of arthritis, COPD, HTN, insomnia, neck pain, Raynaud's presenting to the ED with trouble swallowing x 2-3 days that has gotten worse starting yesterday. Patient reported that the pain worsened last night. Patient reported that when she swallows the discomfort is worse, feels like all of her pills are getting stuck - stated that she has been unable to drink or eat food due to difficulty swallowing. Stated that she feels like she is choking when she swallows water. Patient reported that the pain hurts all the way down to her stomach. Daughter-in-law noticed that patient's voice is softer than normal - stated that it hurts to talk. Reported chills yesterday. Patient reported that she has been feeling tired and nausea noted. Denied diarrhea, hematochezia, melena, chest pain, shortness of breath, difficulty breathing, dizziness. PCP Dr. Cyndia Bent Patient followed by pain clinic for back pain.    Past Medical History  Diagnosis Date  . Arthritis   . COPD (chronic obstructive pulmonary disease)   . Fibromyalgia   . Hypertension   . Insomnia   . Allergic rhinitis   . Neck pain   . Raynaud's disease    Past Surgical History  Procedure Laterality Date  . Abdominal hysterectomy    . Tubal ligation    . Hemorroidectomy    . Breast biopsy    . Lumbar laminectomy/decompression microdiscectomy  10/09/2011    Procedure: LUMBAR LAMINECTOMY/DECOMPRESSION MICRODISCECTOMY 1 LEVEL;  Surgeon: Maeola Harman, MD;  Location: [facility];  Service: Neurosurgery;  Laterality: Right;  RIGHT Lumbar four-five  foraminotomy with possible microdiskectomy   Family History  Problem Relation Age of Onset  . Rheum arthritis Mother   . Breast cancer Mother   . Breast cancer Sister   . Ovarian cancer Maternal Grandmother   . Rectal cancer Paternal Grandmother    History  Substance Use Topics  . Smoking status: Current Every Day Smoker -- 1.00 packs/day for 25 years    Types: Cigarettes  . Smokeless tobacco: Never Used  . Alcohol Use: No     Comment: Quit in 2001   OB History   Grav Para Term Preterm Abortions TAB SAB Ect Mult Living                 Review of Systems  Constitutional: Positive for fever and chills.  HENT: Positive for sore throat and trouble swallowing. Negative for neck pain and neck stiffness.   Respiratory: Positive for cough. Negative for chest tightness and shortness of breath.   Cardiovascular: Negative for chest pain.  Gastrointestinal: Positive for nausea and abdominal pain. Negative for vomiting, constipation, blood in stool and anal bleeding.  Genitourinary: Negative for dysuria and decreased urine volume.  Neurological: Positive for weakness. Negative for dizziness, numbness and headaches.  All other systems reviewed and are negative.    Allergies  Prednisone; Celebrex; Doxycycline; Estradiol; Fish allergy; Lisinopril; Methotrexate derivatives; Naproxen; Nsaids; Sumatriptan; Latex; Penicillins; and Sulfa antibiotics  Home Medications   Current Outpatient Rx  Name  Route  Sig  Dispense  Refill  . amitriptyline (ELAVIL) 150 MG tablet   Oral  Take 150 mg by mouth at bedtime.           . bisoprolol (ZEBETA) 5 MG tablet   Oral   Take 5 mg by mouth daily.         . clotrimazole (MYCELEX) 10 MG troche   Oral   Take 10 mg by mouth 3 (three) times daily.         . cyclobenzaprine (FLEXERIL) 10 MG tablet   Oral   Take 10 mg by mouth 2 (two) times daily as needed for muscle spasms.         . diazepam (VALIUM) 5 MG tablet   Oral   Take 5 mg by  mouth daily as needed for anxiety or sleep.         Marland Kitchen Emollient (CERAVE) CREA   Apply externally   Apply 1 application topically 2 (two) times daily.         Marland Kitchen EPINEPHrine (EPI-PEN) 0.3 mg/0.3 mL DEVI   Intramuscular   Inject 0.3 mg into the muscle as needed (allergic reaction).         Marland Kitchen esomeprazole (NEXIUM) 40 MG capsule   Oral   Take 40 mg by mouth 2 (two) times daily.           . famotidine (PEPCID) 20 MG tablet   Oral   Take 20 mg by mouth at bedtime. With nexium         . fluconazole (DIFLUCAN) 100 MG tablet   Oral   Take 100 mg by mouth once a week. Takes on Monday         . fluocinonide cream (LIDEX) 0.05 %   Topical   Apply 1 application topically 2 (two) times daily.         . furosemide (LASIX) 20 MG tablet   Oral   Take 20 mg by mouth daily.         . montelukast (SINGULAIR) 10 MG tablet   Oral   Take 10 mg by mouth at bedtime.           Marland Kitchen oxyCODONE-acetaminophen (PERCOCET) 10-325 MG per tablet   Oral   Take 0.5 tablets by mouth every 6 (six) hours as needed. For pain   12 tablet   0   . pregabalin (LYRICA) 50 MG capsule   Oral   Take 50 mg by mouth 2 (two) times daily.         Marland Kitchen topiramate (TOPAMAX) 50 MG tablet   Oral   Take 50 mg by mouth 2 (two) times daily.          Marland Kitchen triamcinolone cream (KENALOG) 0.1 %   Topical   Apply topically 3 (three) times daily.   454 g   2   . zolpidem (AMBIEN) 10 MG tablet   Oral   Take 10 mg by mouth at bedtime as needed for sleep.          Marland Kitchen adalimumab (HUMIRA PEN) 40 MG/0.8ML injection   Subcutaneous   Inject 40 mg into the skin every 14 (fourteen) days.         Marland Kitchen albuterol (PROVENTIL HFA;VENTOLIN HFA) 108 (90 BASE) MCG/ACT inhaler   Inhalation   Inhale 2 puffs into the lungs 3 (three) times daily as needed. For shortness of breath          . hydrocortisone (ANUSOL-HC) 2.5 % rectal cream   Topical   Apply 1 application topically 2 (two) times daily as needed (itching).          Marland Kitchen  Olopatadine HCl (PATADAY) 0.2 % SOLN   Both Eyes   Place 1 drop into both eyes 2 (two) times daily.          Marland Kitchen oxyCODONE-acetaminophen (PERCOCET) 5-325 MG per tablet   Oral   Take 1 tablet by mouth every 8 (eight) hours as needed for pain.   10 tablet   0    BP 146/88  Pulse 98  Temp(Src) 100.2 F (37.9 C) (Oral)  Resp 17  SpO2 96% Physical Exam  Nursing note and vitals reviewed. Constitutional: She is oriented to person, place, and time. She appears well-developed and well-nourished. No distress.  HENT:  Head: Normocephalic and atraumatic.  Mouth/Throat: Oropharynx is clear and moist.  Uvula midline, symmetrical elevation. Negative erythema, inflammation, swelling noted to the posterior oropharynx.   Eyes: Conjunctivae and EOM are normal. Pupils are equal, round, and reactive to light. Right eye exhibits no discharge. Left eye exhibits discharge (purulent discharge).  Neck: Normal range of motion. Neck supple.  Negative lymphadenopathy Negative neck stiffness Negative nuchal rigidity Negative masses palpated to exam  Cardiovascular: Normal rate, regular rhythm and normal heart sounds.  Exam reveals no friction rub.   No murmur heard. Pulses:      Radial pulses are 2+ on the right side, and 2+ on the left side.       Dorsalis pedis pulses are 2+ on the right side, and 2+ on the left side.  Pulmonary/Chest: Effort normal and breath sounds normal. No respiratory distress. She has no wheezes. She has no rales.  Abdominal: Soft. Bowel sounds are normal. She exhibits no distension. There is generalized tenderness. There is positive Murphy's sign. There is no rebound and no guarding.    Neurological: She is alert and oriented to person, place, and time. No cranial nerve deficit. She exhibits normal muscle tone. Coordination normal.  Skin: Skin is warm and dry. No rash noted. She is not diaphoretic. No erythema.  Psychiatric: She has a normal mood and affect. Her behavior  is normal. Thought content normal.    ED Course  Procedures (including critical care time)  2:40PM Re-assessed patient. Patient looking well. Reported that she is feeling better. Patient reported that she is seeing GI due to esophageal strictures that have been ongoing since 2010 - patient reported that almost every year she has gotten a dilation, stated that she has not gotten a dilation within a year and a half. Patient stated that she is followed by GI and that she has an appointment with the GI doctor by the beginning of next week - within 10 days as per patient.   4:30PM Re-assessed patient - BS normoactive, soft, negative pain upon palpation to the abdomen. Tolerated apple sauce and fluids PO.   Labs Reviewed  URINALYSIS, ROUTINE W REFLEX MICROSCOPIC - Abnormal; Notable for the following:    Hgb urine dipstick SMALL (*)    All other components within normal limits  CBC WITH DIFFERENTIAL - Abnormal; Notable for the following:    RBC 5.34 (*)    Platelets 127 (*)    All other components within normal limits  URINE MICROSCOPIC-ADD ON - Abnormal; Notable for the following:    Squamous Epithelial / LPF FEW (*)    All other components within normal limits  POCT I-STAT, CHEM 8 - Abnormal; Notable for the following:    BUN 3 (*)    Glucose, Bld 111 (*)    Hemoglobin 16.0 (*)    HCT 47.0 (*)  All other components within normal limits  RAPID STREP SCREEN  CULTURE, GROUP A STREP  LIPASE, BLOOD  CG4 I-STAT (LACTIC ACID)  CG4 I-STAT (LACTIC ACID)   Dg Chest 2 View  01/19/2013   *RADIOLOGY REPORT*  Clinical Data: Cough  CHEST - 2 VIEW  Comparison: 12/10/2012  Findings: The cardiac shadow is stable.  The lungs are clear bilaterally.  No sizable effusion is seen.  No bony abnormality is noted.  IMPRESSION: No acute abnormalities seen.   Original Report Authenticated By: Alcide Clever, M.D.   Ct Head Wo Contrast  01/19/2013   *RADIOLOGY REPORT*  Clinical Data: Difficulty swallowing  CT HEAD  WITHOUT CONTRAST  Technique:  Contiguous axial images were obtained from the base of the skull through the vertex without contrast.  Comparison: None.  Findings: The bony calvarium is intact.  There are no findings to suggest acute hemorrhage, acute infarction or space-occupying mass lesion.  IMPRESSION: No acute abnormality is noted.   Original Report Authenticated By: Alcide Clever, M.D.   US Abdomen Complete  01/19/2013   *RADIOLOGY REPORT*  Clinical Data:  Abdominal pain  COMPLETE ABDOMINAL ULTRASOUND  Comparison:  None.  Findings:  Gallbladder:  No gallstones, gallbladder wall thickening, or pericholecystic fluid.  Common bile duct:  3.5 mm and within normal limits.  Liver:  No focal lesion identified.  Within normal limits in parenchymal echogenicity.  IVC:  Appears normal.  Pancreas:  No focal abnormality seen.  Spleen:  4.9 cm.  Right Kidney:  10.1 cm.  No mass lesion or hydronephrosis is noted.  Left Kidney:  10.2 cm.  No mass lesion or hydronephrosis is noted.  Abdominal aorta:  No aneurysm identified.  IMPRESSION: No acute abnormality noted.   Original Report Authenticated By: Alcide Clever, M.D.   1. Difficulty swallowing   2. Esophageal stricture   3. Viral illness   4. HTN (hypertension)   5. COPD (chronic obstructive pulmonary disease)   6. Neck pain     MDM  Patient presenting to the ED with difficulty swallowing, stated that this has been ongoing for the past 2-3 days. Stated that the pain is worse when she swallows.  Negative masses palpated upon exam. Negative erythema, inflammation, swelling noted to the posterior oropharynx and exudate noted to the tonsils. Generalized abdominal pain, most discomfort noted to the RUQ with positive Murphy's sign. Negative acute abdomen, negative peritoneal signs.  Negative rapid strep test. CBC negative elevation of WBC count. Chem-8 negative findings. Lactic acid - negative elevation. UA negative findings, small amount of Hgb. CT head negative  findings - doubt stroke like symptoms. Chest xray negative findings. US abdomen negative findings for gallstones, gallbladder wall thickening, pericholecystic fluid - negative findings noted to the [place].  Patient reported that she follows GI - reported that since 2010 she has been having dilation of her esophageal strictures. Stated that she has an esophageal dilation at least once per year. Patient reported that she has not had a dilation within a year and a half reported having discomfort. Patient stated that she has been in contact with her GI physician and stated that she is due to get a phone call within the next 10 days, and if she does not get the phone call she is to go in to see physician. Discussed case with Dr. Theodoro Grist - stated to discontinue swallow test, cleared patient to be discharged. Since patient has close follow-up with GI and to be getting esophageal stricture dilation patient to be  discharged. Patient feeling better - pain has improved. Patient tolerated fluids and apple sauce. Discussed case with Dr. Theodoro Grist - cleared patient for discharge. Discharged patient with pain medications - discussed precautions and disposal technique. Discussed with patient to rest and drink a lot of fluids - discussed with patient to eat soft foods. Referred patient to her GI doctor (patient unable to remember name) - strict instructions given that if patient cannot get appointment with physician by early next week to return to the ED. Discussed with patient to monitor symptoms and if symptoms are to worsen or change to report back to the ED - strict return instructions given. Patient agreed to plan of care, understood, all questions answered.     Raymon Mutton, PA-C 01/19/13 1727

## 2013-01-19 NOTE — ED Notes (Addendum)
C/o intermittent episodes of difficulty & painful swallowing x 5 months. Saw GI MD yesterday & is to have an endoscopy scheduled soon. Pt reports worsening sore throat , chills, nausea no emesis, generalized abd pain x 2days. Last BM 5-6 days ago. States unable to swallow pills since last night therefore has not had any pain meds which she gets from pain management clinic for her fibromyalgia, arthritis pain

## 2013-01-21 LAB — CULTURE, GROUP A STREP

## 2013-01-22 NOTE — ED Provider Notes (Signed)
Medical screening examination/treatment/procedure(s) were performed by non-physician practitioner and as supervising physician I was immediately available for consultation/collaboration.  Derwood Kaplan, MD 01/22/13 1719

## 2013-02-20 ENCOUNTER — Encounter (HOSPITAL_COMMUNITY): Payer: Self-pay | Admitting: *Deleted

## 2013-02-20 ENCOUNTER — Emergency Department (HOSPITAL_COMMUNITY)
Admission: EM | Admit: 2013-02-20 | Discharge: 2013-02-20 | Disposition: A | Discharge status: Home / Self Care | Payer: Medicaid Other | Attending: Emergency Medicine | Admitting: Emergency Medicine

## 2013-02-20 DIAGNOSIS — M545 Low back pain, unspecified: Secondary | ICD-10-CM | POA: Insufficient documentation

## 2013-02-20 DIAGNOSIS — G8929 Other chronic pain: Secondary | ICD-10-CM | POA: Insufficient documentation

## 2013-02-20 DIAGNOSIS — Z9889 Other specified postprocedural states: Secondary | ICD-10-CM | POA: Insufficient documentation

## 2013-02-20 DIAGNOSIS — IMO0001 Reserved for inherently not codable concepts without codable children: Secondary | ICD-10-CM | POA: Insufficient documentation

## 2013-02-20 DIAGNOSIS — Z91040 Latex allergy status: Secondary | ICD-10-CM | POA: Insufficient documentation

## 2013-02-20 DIAGNOSIS — R209 Unspecified disturbances of skin sensation: Secondary | ICD-10-CM | POA: Insufficient documentation

## 2013-02-20 DIAGNOSIS — Z8739 Personal history of other diseases of the musculoskeletal system and connective tissue: Secondary | ICD-10-CM | POA: Insufficient documentation

## 2013-02-20 DIAGNOSIS — Z79899 Other long term (current) drug therapy: Secondary | ICD-10-CM | POA: Insufficient documentation

## 2013-02-20 DIAGNOSIS — Z8679 Personal history of other diseases of the circulatory system: Secondary | ICD-10-CM | POA: Insufficient documentation

## 2013-02-20 DIAGNOSIS — F17200 Nicotine dependence, unspecified, uncomplicated: Secondary | ICD-10-CM | POA: Insufficient documentation

## 2013-02-20 DIAGNOSIS — I10 Essential (primary) hypertension: Secondary | ICD-10-CM | POA: Insufficient documentation

## 2013-02-20 DIAGNOSIS — Z880 Allergy status to penicillin: Secondary | ICD-10-CM | POA: Insufficient documentation

## 2013-02-20 DIAGNOSIS — J4489 Other specified chronic obstructive pulmonary disease: Secondary | ICD-10-CM | POA: Insufficient documentation

## 2013-02-20 DIAGNOSIS — R269 Unspecified abnormalities of gait and mobility: Secondary | ICD-10-CM | POA: Insufficient documentation

## 2013-02-20 DIAGNOSIS — R21 Rash and other nonspecific skin eruption: Secondary | ICD-10-CM | POA: Insufficient documentation

## 2013-02-20 DIAGNOSIS — J449 Chronic obstructive pulmonary disease, unspecified: Secondary | ICD-10-CM | POA: Insufficient documentation

## 2013-02-20 NOTE — ED Provider Notes (Signed)
CSN: [id number]     Arrival date & time 02/20/13  1058 History     First MD Initiated Contact with Patient 02/20/13 1257     Chief Complaint  Patient presents with  . Back Pain  . Rash   (Consider location/radiation/quality/duration/timing/severity/associated sxs/prior Treatment) HPI Comments: Patient with a history of chronic lower back pain presents today with lower back pain.  She is currently followed by Pain Management for her lower back pain.  She also reports that she has been evaluated by Dr. Venetia Maxon with Neurosurgery, but was told that she can not have surgery until she quits smoking.  She had a previous Lumbar Laminectomy in April 2013.  She reports that her pain is the typical pain that she has had in the past.  Pain gradually worsening.  She has been taking Percocet for the pain, but does not feel that it helps.  She would like something stronger for pain.  She denies any acute injury or trauma.  She recently had a lumbar spine MRI done on 01-13-13, which showed moderate to severe spinal stenosis at L3-L4 and moderate to severe bilateral L5 foraminal stenosis.    Patient is a 62 y.o. female presenting with back pain. The history is provided by the patient.  Back Pain Associated symptoms: numbness and tingling   Associated symptoms: no abdominal pain, no bladder incontinence, no bowel incontinence, no chest pain, no dysuria, no fever, no leg pain, no pelvic pain and no weakness     Past Medical History  Diagnosis Date  . COPD (chronic obstructive pulmonary disease)   . Fibromyalgia   . Hypertension   . Insomnia   . Allergic rhinitis   . Neck pain   . Raynaud's disease   . Arthritis     rheumatoid   Past Surgical History  Procedure Laterality Date  . Abdominal hysterectomy    . Tubal ligation    . Hemorroidectomy    . Breast biopsy    . Lumbar laminectomy/decompression microdiscectomy  10/09/2011    Procedure: LUMBAR LAMINECTOMY/DECOMPRESSION MICRODISCECTOMY 1 LEVEL;   Surgeon: Maeola Harman, MD;  Location: [facility];  Service: Neurosurgery;  Laterality: Right;  RIGHT Lumbar four-five foraminotomy with possible microdiskectomy   Family History  Problem Relation Age of Onset  . Rheum arthritis Mother   . Breast cancer Mother   . Breast cancer Sister   . Ovarian cancer Maternal Grandmother   . Rectal cancer Paternal Grandmother    History  Substance Use Topics  . Smoking status: Current Every Day Smoker -- 1.00 packs/day for 25 years    Types: Cigarettes  . Smokeless tobacco: Never Used  . Alcohol Use: No     Comment: Quit in 2001   OB History   Grav Para Term Preterm Abortions TAB SAB Ect Mult Living                 Review of Systems  Constitutional: Negative for fever.  Cardiovascular: Negative for chest pain.  Gastrointestinal: Negative for abdominal pain and bowel incontinence.  Genitourinary: Negative for bladder incontinence, dysuria and pelvic pain.  Musculoskeletal: Positive for back pain.  Neurological: Positive for tingling and numbness. Negative for weakness.    Allergies  Prednisone; Celebrex; Doxycycline; Estradiol; Fish allergy; Lisinopril; Methotrexate derivatives; Naproxen; Nsaids; Sumatriptan; Latex; Penicillins; and Sulfa antibiotics  Home Medications   Current Outpatient Rx  Name  Route  Sig  Dispense  Refill  . adalimumab (HUMIRA PEN) 40 MG/0.8ML injection   Subcutaneous  Inject 40 mg into the skin every 14 (fourteen) days.         Marland Kitchen albuterol (PROVENTIL HFA;VENTOLIN HFA) 108 (90 BASE) MCG/ACT inhaler   Inhalation   Inhale 2 puffs into the lungs 3 (three) times daily as needed. For shortness of breath          . amitriptyline (ELAVIL) 150 MG tablet   Oral   Take 150 mg by mouth at bedtime.           . bisoprolol (ZEBETA) 5 MG tablet   Oral   Take 5 mg by mouth daily.         . clotrimazole (MYCELEX) 10 MG troche   Oral   Take 10 mg by mouth as needed (yeast and thrush in mouth).          .  cyclobenzaprine (FLEXERIL) 10 MG tablet   Oral   Take 10 mg by mouth 2 (two) times daily as needed for muscle spasms.         . diazepam (VALIUM) 5 MG tablet   Oral   Take 5 mg by mouth daily as needed for anxiety or sleep.         Marland Kitchen Emollient (CERAVE) CREA   Apply externally   Apply 1 application topically 2 (two) times daily.         Marland Kitchen EPINEPHrine (EPI-PEN) 0.3 mg/0.3 mL DEVI   Intramuscular   Inject 0.3 mg into the muscle as needed (allergic reaction).         Marland Kitchen esomeprazole (NEXIUM) 40 MG capsule   Oral   Take 40 mg by mouth 2 (two) times daily.           . famotidine (PEPCID) 20 MG tablet   Oral   Take 20 mg by mouth at bedtime. With nexium         . furosemide (LASIX) 20 MG tablet   Oral   Take 20 mg by mouth 3 (three) times daily as needed for fluid or edema.          . hydrocortisone (ANUSOL-HC) 2.5 % rectal cream   Topical   Apply 1 application topically 2 (two) times daily as needed (itching).         . montelukast (SINGULAIR) 10 MG tablet   Oral   Take 10 mg by mouth at bedtime.           . Olopatadine HCl (PATADAY) 0.2 % SOLN   Both Eyes   Place 1 drop into both eyes 2 (two) times daily.          Marland Kitchen oxyCODONE-acetaminophen (PERCOCET) 10-325 MG per tablet   Oral   Take 0.5 tablets by mouth every 6 (six) hours as needed. For pain   12 tablet   0   . potassium chloride (K-DUR,KLOR-CON) 10 MEQ tablet   Oral   Take 10 mEq by mouth 3 (three) times daily as needed (take with lasix).         Marland Kitchen topiramate (TOPAMAX) 50 MG tablet   Oral   Take 50 mg by mouth as needed (mirgaine).          . triamcinolone cream (KENALOG) 0.1 %   Topical   Apply topically 3 (three) times daily.   454 g   2   . zolpidem (AMBIEN) 10 MG tablet   Oral   Take 10 mg by mouth at bedtime as needed for sleep.          Marland Kitchen  fluconazole (DIFLUCAN) 100 MG tablet   Oral   Take 100 mg by mouth once a week. Takes on Monday         . pregabalin (LYRICA) 50 MG  capsule   Oral   Take 50 mg by mouth 2 (two) times daily.          BP 117/67  Pulse 63  Temp(Src) 98.5 F (36.9 C) (Oral)  Resp 18  SpO2 98% Physical Exam  Nursing note and vitals reviewed. Constitutional: She is oriented to person, place, and time. She appears well-developed and well-nourished. No distress.  HENT:  Head: Normocephalic and atraumatic.  Eyes: Conjunctivae and EOM are normal. Pupils are equal, round, and reactive to light. No scleral icterus.  Neck: Normal range of motion and full passive range of motion without pain. Neck supple.  Cardiovascular: Normal rate, regular rhythm, normal heart sounds and intact distal pulses.   Pulmonary/Chest: Effort normal and breath sounds normal.  Musculoskeletal:       Cervical back: She exhibits normal range of motion, no tenderness, no bony tenderness and no pain.       Thoracic back: She exhibits no tenderness, no bony tenderness and no pain.       Lumbar back: She exhibits tenderness, bony tenderness and pain. She exhibits no spasm and normal pulse.       Right foot: She exhibits no swelling.       Left foot: She exhibits no swelling.  Bilateral lower extremities nontender without color change, baseline range of motion of extremities with intact distal pulses.  Pt has increased pain w ROM of lumbar spine. Pain w ambulation, no sign of ataxia.  Neurological: She is alert and oriented to person, place, and time. She has normal strength and normal reflexes. No sensory deficit. Gait (no ataxia, slowed and hunched d/t pain ) abnormal.  Sensation at baseline for light touch in all 4 distal extremities, motor symmetric & bilateral 5/5 (hips: abduction, adduction, flexion; knee: flexion & extension; foot: dorsiflexion, plantar flexion, toes: dorsi flexion) Patellar & ankle reflexes intact.   Skin: Skin is warm and dry. No rash noted. She is not diaphoretic. No erythema.  Psychiatric: She has a normal mood and affect.    ED Course    Procedures (including critical care time)  Labs Reviewed - No data to display No results found. No diagnosis found.  MDM  MDM Number of Diagnoses or Management Options Patient with chronic back pain.  No neurological deficits and normal neuro exam.  Patient can walk but states is painful.  No loss of bowel or bladder control.  No concern for cauda equina.  No fever, night sweats, weight loss, h/o cancer, IVDU.  Patient currently followed by pain management for her chronic pain.  Patient informed that she will not be getting any additional prescriptions and that her pain will have to be managed by Pain Management.      Pascal Lux [place], PA-C 02/21/13 [phone]

## 2013-02-20 NOTE — ED Notes (Signed)
Pt c/o chronic back pain r/t herniated discs.  She sees a neurologist who will not perform surgery until she quits smoking and improves her strength with aquatic exercise.  Pt states she cannot quit smoking and cannot find an aquatic therapist.  She needs help finding an aquatic therapist.  However, she would like to have some pain coverage.  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

## 2013-02-22 NOTE — ED Provider Notes (Signed)
Medical screening examination/treatment/procedure(s) were performed by non-physician practitioner and as supervising physician I was immediately available for consultation/collaboration.   Laray Anger, DO 02/22/13 2030

## 2013-07-13 ENCOUNTER — Emergency Department (HOSPITAL_COMMUNITY): Payer: Medicaid Other

## 2013-07-13 DIAGNOSIS — Z91040 Latex allergy status: Secondary | ICD-10-CM | POA: Insufficient documentation

## 2013-07-13 DIAGNOSIS — J449 Chronic obstructive pulmonary disease, unspecified: Secondary | ICD-10-CM | POA: Insufficient documentation

## 2013-07-13 DIAGNOSIS — M069 Rheumatoid arthritis, unspecified: Secondary | ICD-10-CM | POA: Insufficient documentation

## 2013-07-13 DIAGNOSIS — J4489 Other specified chronic obstructive pulmonary disease: Secondary | ICD-10-CM | POA: Insufficient documentation

## 2013-07-13 DIAGNOSIS — F17200 Nicotine dependence, unspecified, uncomplicated: Secondary | ICD-10-CM | POA: Insufficient documentation

## 2013-07-13 DIAGNOSIS — J069 Acute upper respiratory infection, unspecified: Secondary | ICD-10-CM | POA: Insufficient documentation

## 2013-07-13 DIAGNOSIS — Z79899 Other long term (current) drug therapy: Secondary | ICD-10-CM | POA: Insufficient documentation

## 2013-07-13 DIAGNOSIS — IMO0001 Reserved for inherently not codable concepts without codable children: Secondary | ICD-10-CM | POA: Insufficient documentation

## 2013-07-13 DIAGNOSIS — Z880 Allergy status to penicillin: Secondary | ICD-10-CM | POA: Insufficient documentation

## 2013-07-13 DIAGNOSIS — I10 Essential (primary) hypertension: Secondary | ICD-10-CM | POA: Insufficient documentation

## 2013-07-13 LAB — BASIC METABOLIC PANEL
BUN: 13 mg/dL (ref 6–23)
CO2: 21 mEq/L (ref 19–32)
Calcium: 9.5 mg/dL (ref 8.4–10.5)
Chloride: 103 mEq/L (ref 96–112)
Creatinine, Ser: 0.98 mg/dL (ref 0.50–1.10)
GFR calc Af Amer: 70 mL/min — ABNORMAL LOW (ref >90)
GFR calc non Af Amer: 61 mL/min — ABNORMAL LOW (ref >90)
Glucose, Bld: 109 mg/dL — ABNORMAL HIGH (ref 70–99)
Potassium: 3.6 mEq/L — ABNORMAL LOW (ref 3.7–5.3)
Sodium: 139 mEq/L (ref 137–147)

## 2013-07-13 LAB — CBC
HCT: 42.7 % (ref 36.0–46.0)
Hemoglobin: 14.7 g/dL (ref 12.0–15.0)
MCH: 27.7 pg (ref 26.0–34.0)
MCHC: 34.4 g/dL (ref 30.0–36.0)
MCV: 80.6 fL (ref 78.0–100.0)
Platelets: 124 10*3/uL — ABNORMAL LOW (ref 150–400)
RBC: 5.3 MIL/uL — ABNORMAL HIGH (ref 3.87–5.11)
RDW: 13.7 % (ref 11.5–15.5)
WBC: 4.1 10*3/uL (ref 4.0–10.5)

## 2013-07-13 NOTE — ED Notes (Signed)
Generalized body aches, not eating, weakness, productive cough (thick brown); congested. Fevers/chills.

## 2013-07-14 ENCOUNTER — Emergency Department (HOSPITAL_COMMUNITY)
Admission: EM | Admit: 2013-07-14 | Discharge: 2013-07-14 | Disposition: A | Discharge status: Home / Self Care | Payer: Medicaid Other | Attending: Emergency Medicine | Admitting: Emergency Medicine

## 2013-07-14 DIAGNOSIS — J988 Other specified respiratory disorders: Secondary | ICD-10-CM

## 2013-07-14 DIAGNOSIS — B9789 Other viral agents as the cause of diseases classified elsewhere: Secondary | ICD-10-CM

## 2013-07-14 MED ORDER — OXYMETAZOLINE HCL 0.05 % NA SOLN
1.0000 | Freq: Once | NASAL | Status: AC
  Administered 2013-07-14: 1 via NASAL
  Filled 2013-07-14: qty 15

## 2013-07-14 MED ORDER — ACETAMINOPHEN 325 MG PO TABS
650.0000 mg | ORAL_TABLET | Freq: Once | ORAL | Status: AC
  Administered 2013-07-14: 650 mg via ORAL
  Filled 2013-07-14: qty 2

## 2013-07-14 MED ORDER — OSELTAMIVIR PHOSPHATE 75 MG PO CAPS
75.0000 mg | ORAL_CAPSULE | Freq: Once | ORAL | Status: AC
  Administered 2013-07-14: 75 mg via ORAL
  Filled 2013-07-14: qty 1

## 2013-07-14 MED ORDER — OSELTAMIVIR PHOSPHATE 75 MG PO CAPS: 75.0000 mg | ORAL_CAPSULE | Freq: Two times a day (BID) | ORAL | Status: DC

## 2013-07-14 NOTE — Discharge Instructions (Signed)

## 2013-07-14 NOTE — ED Provider Notes (Signed)
CSN: [id number]     Arrival date & time 07/13/13  2039 History   First MD Initiated Contact with Patient 07/14/13 0326     Chief Complaint  Patient presents with  . Cough  . Nasal Congestion   (Consider location/radiation/quality/duration/timing/severity/associated sxs/prior Treatment) HPI History provided by patient. Flulike symptoms ongoing for the last few days including generalized body aches, sore throat, headache, cough and nasal congestion. Patient reports subjective fevers with chills. Her grandchildren have been sick. She did not get a flu shot this year. No nausea vomiting or diarrhea. She has a history of COPD and uses her nebulizer as needed. She denies any difficulty breathing at this time. Symptoms moderate severity. Past Medical History  Diagnosis Date  . COPD (chronic obstructive pulmonary disease)   . Fibromyalgia   . Hypertension   . Insomnia   . Allergic rhinitis   . Neck pain   . Raynaud's disease   . Arthritis     rheumatoid   Past Surgical History  Procedure Laterality Date  . Abdominal hysterectomy    . Tubal ligation    . Hemorroidectomy    . Breast biopsy    . Lumbar laminectomy/decompression microdiscectomy  10/09/2011    Procedure: LUMBAR LAMINECTOMY/DECOMPRESSION MICRODISCECTOMY 1 LEVEL;  Surgeon: Erline Levine, MD;  Location: [facility];  Service: Neurosurgery;  Laterality: Right;  RIGHT Lumbar four-five foraminotomy with possible microdiskectomy   Family History  Problem Relation Age of Onset  . Rheum arthritis Mother   . Breast cancer Mother   . Breast cancer Sister   . Ovarian cancer Maternal Grandmother   . Rectal cancer Paternal Grandmother    History  Substance Use Topics  . Smoking status: Current Every Day Smoker -- 1.00 packs/day for 25 years    Types: Cigarettes  . Smokeless tobacco: Never Used  . Alcohol Use: No     Comment: Quit in 2001   OB History   Grav Para Term Preterm Abortions TAB SAB Ect Mult Living                  Review of Systems  Constitutional: Positive for fever and chills.  HENT: Positive for congestion and sore throat. Negative for trouble swallowing and voice change.   Eyes: Negative for visual disturbance.  Respiratory: Positive for cough. Negative for shortness of breath.   Cardiovascular: Negative for chest pain.  Gastrointestinal: Negative for vomiting and abdominal pain.  Genitourinary: Negative for dysuria.  Musculoskeletal: Negative for neck stiffness.  Skin: Negative for rash.  Neurological: Positive for headaches. Negative for syncope.  All other systems reviewed and are negative.    Allergies  Celebrex; Doxycycline; Fentanyl; Lisinopril; Lyrica; Naproxen; Nsaids; Prednisone; Sumatriptan; Estradiol; Fish allergy; Methotrexate derivatives; Latex; Penicillins; and Sulfa antibiotics  Home Medications   Current Outpatient Rx  Name  Route  Sig  Dispense  Refill  . adalimumab (HUMIRA PEN) 40 MG/0.8ML injection   Subcutaneous   Inject 40 mg into the skin every 14 (fourteen) days.         Marland Kitchen albuterol (PROVENTIL HFA;VENTOLIN HFA) 108 (90 BASE) MCG/ACT inhaler   Inhalation   Inhale 2 puffs into the lungs 3 (three) times daily as needed. For shortness of breath          . amitriptyline (ELAVIL) 150 MG tablet   Oral   Take 150 mg by mouth at bedtime.           . bisoprolol (ZEBETA) 5 MG tablet   Oral  Take 5 mg by mouth daily.         . diazepam (VALIUM) 5 MG tablet   Oral   Take 5 mg by mouth daily as needed for anxiety or sleep.         Marland Kitchen Emollient (CERAVE) CREA   Apply externally   Apply 1 application topically 2 (two) times daily.         Marland Kitchen esomeprazole (NEXIUM) 40 MG capsule   Oral   Take 40 mg by mouth 2 (two) times daily.           . famotidine (PEPCID) 20 MG tablet   Oral   Take 20 mg by mouth at bedtime. With nexium         . montelukast (SINGULAIR) 10 MG tablet   Oral   Take 10 mg by mouth at bedtime.           . Olopatadine HCl  (PATADAY) 0.2 % SOLN   Both Eyes   Place 1 drop into both eyes 2 (two) times daily.          Marland Kitchen oxyCODONE-acetaminophen (PERCOCET) 10-325 MG per tablet   Oral   Take 0.5 tablets by mouth every 6 (six) hours as needed. For pain   12 tablet   0   . topiramate (TOPAMAX) 50 MG tablet   Oral   Take 50 mg by mouth as needed (mirgaine).          . triamcinolone cream (KENALOG) 0.1 %   Topical   Apply topically 3 (three) times daily.   454 g   2   . zolpidem (AMBIEN) 10 MG tablet   Oral   Take 10 mg by mouth at bedtime as needed for sleep.           BP 135/78  Pulse 76  Temp(Src) 100.4 F (38 C) (Oral)  Resp 20  SpO2 99% Physical Exam  Constitutional: She is oriented to person, place, and time. She appears well-developed and well-nourished.  HENT:  Head: Normocephalic and atraumatic.  Mouth/Throat: Oropharynx is clear and moist. No oropharyngeal exudate.  Eyes: EOM are normal. Pupils are equal, round, and reactive to light. No scleral icterus.  Neck: Neck supple. No thyromegaly present.  Cardiovascular: Regular rhythm and intact distal pulses.   Pulmonary/Chest: Effort normal and breath sounds normal. No stridor. No respiratory distress. She has no wheezes. She exhibits no tenderness.  Abdominal: Soft. She exhibits no distension. There is no tenderness.  Musculoskeletal: Normal range of motion. She exhibits no edema and no tenderness.  Neurological: She is alert and oriented to person, place, and time. No cranial nerve deficit.  Skin: Skin is warm and dry.    ED Course  Procedures (including critical care time) Labs Review Labs Reviewed  BASIC METABOLIC PANEL - Abnormal; Notable for the following:    Potassium 3.6 (*)    Glucose, Bld 109 (*)    GFR calc non Af Amer 61 (*)    GFR calc Af Amer 70 (*)    All other components within normal limits  CBC - Abnormal; Notable for the following:    RBC 5.30 (*)    Platelets 124 (*)    All other components within normal  limits   Imaging Review Dg Chest 2 View (if Patient Has Fever And/or Copd)  07/13/2013   CLINICAL DATA:  Cough and nasal congestion  EXAM: CHEST  2 VIEW  COMPARISON:  01/19/2013  FINDINGS: Normal heart size and mediastinal  contours. No acute infiltrate or edema. Calcified granuloma in the left mid and right lower lungs. No effusion or pneumothorax. No acute osseous findings.  IMPRESSION: Negative for pneumonia.   Electronically Signed   By: Jorje Guild M.D.   On: 07/13/2013 21:40    EKG Interpretation   None      Tylenol. Afrin. Tamiflu initiated.  Plan discharge home with prescription for Tamiflu, influenza precautions and close primary care followup. Patient agrees to strict return precautions and is stable for discharge at this time. She will use inhaler at home as needed and continue all other home medications as prescribed.  MDM  Diagnosis: Viral infection, flulike symptoms  Evaluated with labs and imaging reviewed as above Medications provided Vital signs and nursing notes reviewed and considered    Teressa Lower, MD 07/14/13 [phone]

## 2013-07-14 NOTE — ED Notes (Signed)
Pt. Ambulated around nurses station with no difficulty.  Pulse ox remained steady at 95% and heart rate between 92 and 100 BPM.

## 2013-07-17 ENCOUNTER — Emergency Department (HOSPITAL_COMMUNITY): Payer: Medicaid Other

## 2013-07-17 ENCOUNTER — Encounter (HOSPITAL_COMMUNITY): Payer: Self-pay | Admitting: Emergency Medicine

## 2013-07-17 ENCOUNTER — Emergency Department (HOSPITAL_COMMUNITY)
Admission: EM | Admit: 2013-07-17 | Discharge: 2013-07-18 | Disposition: A | Discharge status: Home / Self Care | Payer: Medicaid Other | Attending: Emergency Medicine | Admitting: Emergency Medicine

## 2013-07-17 DIAGNOSIS — Z91040 Latex allergy status: Secondary | ICD-10-CM | POA: Insufficient documentation

## 2013-07-17 DIAGNOSIS — G4700 Insomnia, unspecified: Secondary | ICD-10-CM | POA: Insufficient documentation

## 2013-07-17 DIAGNOSIS — Z79899 Other long term (current) drug therapy: Secondary | ICD-10-CM | POA: Insufficient documentation

## 2013-07-17 DIAGNOSIS — IMO0001 Reserved for inherently not codable concepts without codable children: Secondary | ICD-10-CM | POA: Insufficient documentation

## 2013-07-17 DIAGNOSIS — I10 Essential (primary) hypertension: Secondary | ICD-10-CM | POA: Insufficient documentation

## 2013-07-17 DIAGNOSIS — Z880 Allergy status to penicillin: Secondary | ICD-10-CM | POA: Insufficient documentation

## 2013-07-17 DIAGNOSIS — Z792 Long term (current) use of antibiotics: Secondary | ICD-10-CM | POA: Insufficient documentation

## 2013-07-17 DIAGNOSIS — J189 Pneumonia, unspecified organism: Secondary | ICD-10-CM

## 2013-07-17 DIAGNOSIS — R110 Nausea: Secondary | ICD-10-CM | POA: Insufficient documentation

## 2013-07-17 DIAGNOSIS — F17200 Nicotine dependence, unspecified, uncomplicated: Secondary | ICD-10-CM | POA: Insufficient documentation

## 2013-07-17 DIAGNOSIS — M069 Rheumatoid arthritis, unspecified: Secondary | ICD-10-CM | POA: Insufficient documentation

## 2013-07-17 DIAGNOSIS — J441 Chronic obstructive pulmonary disease with (acute) exacerbation: Secondary | ICD-10-CM | POA: Insufficient documentation

## 2013-07-17 DIAGNOSIS — J159 Unspecified bacterial pneumonia: Secondary | ICD-10-CM | POA: Insufficient documentation

## 2013-07-17 LAB — CBC WITH DIFFERENTIAL/PLATELET
Basophils Absolute: 0.1 10*3/uL (ref 0.0–0.1)
Basophils Relative: 1 % (ref 0–1)
Eosinophils Absolute: 0.1 10*3/uL (ref 0.0–0.7)
Eosinophils Relative: 1 % (ref 0–5)
HCT: 39.9 % (ref 36.0–46.0)
Hemoglobin: 13.7 g/dL (ref 12.0–15.0)
Lymphocytes Relative: 62 % — ABNORMAL HIGH (ref 12–46)
Lymphs Abs: 3.9 10*3/uL (ref 0.7–4.0)
MCH: 27.5 pg (ref 26.0–34.0)
MCHC: 34.3 g/dL (ref 30.0–36.0)
MCV: 80.1 fL (ref 78.0–100.0)
Monocytes Absolute: 0.8 10*3/uL (ref 0.1–1.0)
Monocytes Relative: 13 % — ABNORMAL HIGH (ref 3–12)
Neutro Abs: 1.4 10*3/uL — ABNORMAL LOW (ref 1.7–7.7)
Neutrophils Relative %: 23 % — ABNORMAL LOW (ref 43–77)
Platelets: 123 10*3/uL — ABNORMAL LOW (ref 150–400)
RBC: 4.98 MIL/uL (ref 3.87–5.11)
RDW: 13.5 % (ref 11.5–15.5)
WBC: 6.3 10*3/uL (ref 4.0–10.5)

## 2013-07-17 MED ORDER — ACETAMINOPHEN 500 MG PO TABS
1000.0000 mg | ORAL_TABLET | Freq: Once | ORAL | Status: AC
  Administered 2013-07-17: 1000 mg via ORAL
  Filled 2013-07-17: qty 2

## 2013-07-17 MED ORDER — DEXAMETHASONE 6 MG PO TABS
10.0000 mg | ORAL_TABLET | Freq: Once | ORAL | Status: AC
  Administered 2013-07-17: 10 mg via ORAL
  Filled 2013-07-17: qty 1

## 2013-07-17 MED ORDER — SODIUM CHLORIDE 0.9 % IV BOLUS (SEPSIS)
1000.0000 mL | Freq: Once | INTRAVENOUS | Status: AC
  Administered 2013-07-17: 1000 mL via INTRAVENOUS

## 2013-07-17 MED ORDER — LEVOFLOXACIN 500 MG PO TABS: 500.0000 mg | ORAL_TABLET | Freq: Every day | ORAL | Status: DC

## 2013-07-17 MED ORDER — AZITHROMYCIN 250 MG PO TABS
500.0000 mg | ORAL_TABLET | Freq: Once | ORAL | Status: AC
  Administered 2013-07-17: 500 mg via ORAL
  Filled 2013-07-17: qty 2

## 2013-07-17 NOTE — ED Notes (Signed)
Pt was dx with the flu here on Thurs and sent home with tamiflu.  Pt states her "ears are popping" , she has diarrhea and she is more short of breath than she was on Thursday. O2 sats are 98*.  VS wnl.

## 2013-07-17 NOTE — ED Notes (Signed)
MD at bedside. 

## 2013-07-17 NOTE — ED Provider Notes (Signed)
CSN: [id number]     Arrival date & time 07/17/13  1720 History   First MD Initiated Contact with Patient 07/17/13 2139     Chief Complaint  Patient presents with  . Shortness of Breath   (Consider location/radiation/quality/duration/timing/severity/associated sxs/prior Treatment) Patient is a 63 y.o. female presenting with general illness. The history is provided by the patient.  Illness Severity:  Moderate Onset quality:  Gradual Duration:  1 week Timing:  Constant Progression:  Worsening Chronicity:  New Associated symptoms: congestion, cough, fatigue, fever, headaches, myalgias and nausea   Associated symptoms: no chest pain, no diarrhea, no rhinorrhea, no shortness of breath, no vomiting and no wheezing     Past Medical History  Diagnosis Date  . COPD (chronic obstructive pulmonary disease)   . Fibromyalgia   . Hypertension   . Insomnia   . Allergic rhinitis   . Neck pain   . Raynaud's disease   . Arthritis     rheumatoid   Past Surgical History  Procedure Laterality Date  . Abdominal hysterectomy    . Tubal ligation    . Hemorroidectomy    . Breast biopsy    . Lumbar laminectomy/decompression microdiscectomy  10/09/2011    Procedure: LUMBAR LAMINECTOMY/DECOMPRESSION MICRODISCECTOMY 1 LEVEL;  Surgeon: Erline Levine, MD;  Location: [facility];  Service: Neurosurgery;  Laterality: Right;  RIGHT Lumbar four-five foraminotomy with possible microdiskectomy   Family History  Problem Relation Age of Onset  . Rheum arthritis Mother   . Breast cancer Mother   . Breast cancer Sister   . Ovarian cancer Maternal Grandmother   . Rectal cancer Paternal Grandmother    History  Substance Use Topics  . Smoking status: Current Every Day Smoker -- 1.00 packs/day for 25 years    Types: Cigarettes  . Smokeless tobacco: Never Used  . Alcohol Use: No     Comment: Quit in 2001   OB History   Grav Para Term Preterm Abortions TAB SAB Ect Mult Living                 Review of  Systems  Constitutional: Positive for fever and fatigue. Negative for chills.  HENT: Positive for congestion. Negative for rhinorrhea.   Eyes: Negative for redness and visual disturbance.  Respiratory: Positive for cough. Negative for shortness of breath and wheezing.   Cardiovascular: Negative for chest pain and palpitations.  Gastrointestinal: Positive for nausea. Negative for vomiting and diarrhea.  Genitourinary: Negative for dysuria and urgency.  Musculoskeletal: Positive for myalgias. Negative for arthralgias.  Skin: Negative for pallor and wound.  Neurological: Positive for headaches. Negative for dizziness.    Allergies  Celebrex; Doxycycline; Fentanyl; Lisinopril; Lyrica; Naproxen; Nsaids; Prednisone; Sumatriptan; Estradiol; Fish allergy; Methotrexate derivatives; Latex; Penicillins; and Sulfa antibiotics  Home Medications   Current Outpatient Rx  Name  Route  Sig  Dispense  Refill  . adalimumab (HUMIRA PEN) 40 MG/0.8ML injection   Subcutaneous   Inject 40 mg into the skin every 14 (fourteen) days.         Marland Kitchen albuterol (PROVENTIL HFA;VENTOLIN HFA) 108 (90 BASE) MCG/ACT inhaler   Inhalation   Inhale 2 puffs into the lungs 3 (three) times daily as needed. For shortness of breath          . amitriptyline (ELAVIL) 150 MG tablet   Oral   Take 150 mg by mouth at bedtime.           . bisoprolol (ZEBETA) 5 MG tablet   Oral  Take 5 mg by mouth daily.         . diazepam (VALIUM) 5 MG tablet   Oral   Take 5 mg by mouth daily as needed for anxiety or sleep.         Marland Kitchen Emollient (CERAVE) CREA   Apply externally   Apply 1 application topically 2 (two) times daily.         Marland Kitchen esomeprazole (NEXIUM) 40 MG capsule   Oral   Take 40 mg by mouth 2 (two) times daily.           . famotidine (PEPCID) 20 MG tablet   Oral   Take 20 mg by mouth at bedtime. With nexium         . montelukast (SINGULAIR) 10 MG tablet   Oral   Take 10 mg by mouth at bedtime.            . Olopatadine HCl (PATADAY) 0.2 % SOLN   Both Eyes   Place 1 drop into both eyes 2 (two) times daily.          Marland Kitchen oseltamivir (TAMIFLU) 75 MG capsule   Oral   Take 1 capsule (75 mg total) by mouth every 12 (twelve) hours.   10 capsule   0   . oxyCODONE-acetaminophen (PERCOCET) 10-325 MG per tablet   Oral   Take 0.5 tablets by mouth every 6 (six) hours as needed. For pain   12 tablet   0   . topiramate (TOPAMAX) 50 MG tablet   Oral   Take 50 mg by mouth as needed (mirgaine).          . triamcinolone cream (KENALOG) 0.1 %   Topical   Apply topically 3 (three) times daily.   454 g   2   . zolpidem (AMBIEN) 10 MG tablet   Oral   Take 10 mg by mouth at bedtime as needed for sleep.          Marland Kitchen levofloxacin (LEVAQUIN) 500 MG tablet   Oral   Take 1 tablet (500 mg total) by mouth daily.   5 tablet   0    BP 133/75  Pulse 64  Temp(Src) 98.7 F (37.1 C) (Oral)  Resp 20  Ht 5\' 6"  (1.676 m)  Wt 175 lb 9.6 oz (79.652 kg)  BMI 28.36 kg/m2  SpO2 99% Physical Exam  Constitutional: She is oriented to person, place, and time. She appears well-developed and well-nourished. No distress.  HENT:  Head: Normocephalic and atraumatic.  Swollen turbinates, TM's clear, mild erythema to oropharynx.   Eyes: EOM are normal. Pupils are equal, round, and reactive to light.  Neck: Normal range of motion. Neck supple.  Cardiovascular: Normal rate and regular rhythm.  Exam reveals no gallop and no friction rub.   No murmur heard. Pulmonary/Chest: Effort normal. She has no wheezes. She has no rales.  Abdominal: Soft. She exhibits no distension. There is no tenderness.  Musculoskeletal: She exhibits no edema and no tenderness.  Neurological: She is alert and oriented to person, place, and time.  Skin: Skin is warm and dry. She is not diaphoretic.  Psychiatric: She has a normal mood and affect. Her behavior is normal.    ED Course  Procedures (including critical care time) Labs  Review Labs Reviewed  CBC WITH DIFFERENTIAL - Abnormal; Notable for the following:    Platelets 123 (*)    Neutrophils Relative % 23 (*)    Lymphocytes Relative 62 (*)  Monocytes Relative 13 (*)    Neutro Abs 1.4 (*)    All other components within normal limits  BASIC METABOLIC PANEL   Imaging Review Dg Chest 2 View (if Patient Has Fever And/or Copd)  07/17/2013   CLINICAL DATA:  Shortness of breath, cough.  EXAM: CHEST  2 VIEW  COMPARISON:  DG CHEST 2 VIEW dated 07/13/2013  FINDINGS: Increasing density anteriorly on the lateral view, new since prior study. This may be within the lingula, question early infiltrate. Right lung is clear. No effusions. Heart is normal size. No acute bony abnormality.  IMPRESSION: Question early lingular infiltrate, best seen on the lateral view.   Electronically Signed   By: Rolm Baptise M.D.   On: 07/17/2013 19:07    EKG Interpretation   None       MDM   1. Community acquired pneumonia     Patient is a 63 year old female with the chief complaint of shortness of breath cough or fever. Patient states this started about a week ago. Patient was seen here diagnosed clinically with the flu started on Tamiflu. Patient states without carefully give her terrible diarrhea patient has been having trouble eating and drinking at home feeling weak. Patient feels like she has had continued fevers and chills.  Chest x-ray shows possible pneumonia and will lingula. Patient with no white count patient not hypoxic not tachypneic patient appears well nontoxic patient is able to without difficulty. We will discharge patient home we will treat this with antibiotics he'll follow with her PCP in 2-3 days.  11:41 PM:  I have discussed the diagnosis/risks/treatment options with the patient and family and believe the pt to be eligible for discharge home to follow-up with PCP. We also discussed returning to the ED immediately if new or worsening sx occur. We discussed the sx which  are most concerning (e.g., shortness of breath, fever) that necessitate immediate return. Any new prescriptions provided to the patient are listed below.  New Prescriptions   LEVOFLOXACIN (LEVAQUIN) 500 MG TABLET    Take 1 tablet (500 mg total) by mouth daily.        Deno Etienne, MD 07/17/13 2342

## 2013-07-17 NOTE — ED Provider Notes (Signed)
63 year old female has been sick for about a week with a cough which was productive of some brownish sputum although it is now clear. She did have fever several days ago but no fever currently. She was seen in the ED 3 days ago and started on oseltamivir and has been feeling worse since then. She's complaining of pain in her right ear and in her throat and states that she has been having problems with vertigo. On exam, TMs are clear. Oropharynx shows mild to moderate erythema without exudate. She's not having any difficulty with her secretions. Lungs are clear and heart has regular rate and rhythm. Chest x-ray shows questionable area of infiltrate. She will be discharged with on antibiotics and will be given a short course of steroids.  Results for orders placed during the hospital encounter of 07/17/13  CBC WITH DIFFERENTIAL      Result Value Range   WBC 6.3  4.0 - 10.5 K/uL   RBC 4.98  3.87 - 5.11 MIL/uL   Hemoglobin 13.7  12.0 - 15.0 g/dL   HCT 39.9  36.0 - 46.0 %   MCV 80.1  78.0 - 100.0 fL   MCH 27.5  26.0 - 34.0 pg   MCHC 34.3  30.0 - 36.0 g/dL   RDW 13.5  11.5 - 15.5 %   Platelets 123 (*) 150 - 400 K/uL   Neutrophils Relative % PENDING  43 - 77 %   Neutro Abs PENDING  1.7 - 7.7 K/uL   Band Neutrophils PENDING  0 - 10 %   Lymphocytes Relative PENDING  12 - 46 %   Lymphs Abs PENDING  0.7 - 4.0 K/uL   Monocytes Relative PENDING  3 - 12 %   Monocytes Absolute PENDING  0.1 - 1.0 K/uL   Eosinophils Relative PENDING  0 - 5 %   Eosinophils Absolute PENDING  0.0 - 0.7 K/uL   Basophils Relative PENDING  0 - 1 %   Basophils Absolute PENDING  0.0 - 0.1 K/uL   WBC Morphology PENDING     RBC Morphology PENDING     Smear Review PENDING     nRBC PENDING  0 /100 WBC   Metamyelocytes Relative PENDING     Myelocytes PENDING     Promyelocytes Absolute PENDING     Blasts PENDING     Dg Chest 2 View (if Patient Has Fever And/or Copd)  07/17/2013   CLINICAL DATA:  Shortness of breath, cough.   EXAM: CHEST  2 VIEW  COMPARISON:  DG CHEST 2 VIEW dated 07/13/2013  FINDINGS: Increasing density anteriorly on the lateral view, new since prior study. This may be within the lingula, question early infiltrate. Right lung is clear. No effusions. Heart is normal size. No acute bony abnormality.  IMPRESSION: Question early lingular infiltrate, best seen on the lateral view.   Electronically Signed   By: Rolm Baptise M.D.   On: 07/17/2013 19:07   Images viewed by me.   I saw and evaluated the patient, reviewed the resident's note and I agree with the findings and plan.   Delora Fuel, MD 02/10/47 1856

## 2013-07-17 NOTE — Discharge Instructions (Signed)
Pneumonia, Adult °Pneumonia is an infection of the lungs. It may be caused by a germ (virus or bacteria). Some types of pneumonia can spread easily from person to person. This can happen when you cough or sneeze. °HOME CARE °· Only take medicine as told by your doctor. °· Take your medicine (antibiotics) as told. Finish it even if you start to feel better. °· Do not smoke. °· You may use a vaporizer or humidifier in your room. This can help loosen thick spit (mucus). °· Sleep so you are almost sitting up (semi-upright). This helps reduce coughing. °· Rest. °A shot (vaccine) can help prevent pneumonia. Shots are often advised for: °· People over 65 years old. °· Patients on chemotherapy. °· People with long-term (chronic) lung problems. °· People with immune system problems. °GET HELP RIGHT AWAY IF:  °· You are getting worse. °· You cannot control your cough, and you are losing sleep. °· You cough up blood. °· Your pain gets worse, even with medicine. °· You have a fever. °· Any of your problems are getting worse, not better. °· You have shortness of breath or chest pain. °MAKE SURE YOU:  °· Understand these instructions. °· Will watch your condition. °· Will get help right away if you are not doing well or get worse. °Document Released: 12/02/2007 Document Revised: 09/07/2011 Document Reviewed: 09/05/2010 °[employer]® Patient Information ©2014 [employer]. ° °

## 2013-07-18 LAB — BASIC METABOLIC PANEL
BUN: 13 mg/dL (ref 6–23)
CO2: 25 mEq/L (ref 19–32)
Calcium: 9.3 mg/dL (ref 8.4–10.5)
Chloride: 104 mEq/L (ref 96–112)
Creatinine, Ser: 0.81 mg/dL (ref 0.50–1.10)
GFR calc Af Amer: 88 mL/min — ABNORMAL LOW (ref >90)
GFR calc non Af Amer: 76 mL/min — ABNORMAL LOW (ref >90)
Glucose, Bld: 100 mg/dL — ABNORMAL HIGH (ref 70–99)
Potassium: 3 mEq/L — ABNORMAL LOW (ref 3.7–5.3)
Sodium: 145 mEq/L (ref 137–147)

## 2013-08-07 ENCOUNTER — Emergency Department (HOSPITAL_COMMUNITY): Payer: Medicaid Other

## 2013-08-07 ENCOUNTER — Emergency Department (HOSPITAL_COMMUNITY)
Admission: EM | Admit: 2013-08-07 | Discharge: 2013-08-07 | Discharge status: Against Medical Advice | Payer: Medicaid Other | Attending: Emergency Medicine | Admitting: Emergency Medicine

## 2013-08-07 ENCOUNTER — Encounter (HOSPITAL_COMMUNITY): Payer: Self-pay | Admitting: Emergency Medicine

## 2013-08-07 DIAGNOSIS — G4700 Insomnia, unspecified: Secondary | ICD-10-CM | POA: Insufficient documentation

## 2013-08-07 DIAGNOSIS — I10 Essential (primary) hypertension: Secondary | ICD-10-CM | POA: Insufficient documentation

## 2013-08-07 DIAGNOSIS — Z8739 Personal history of other diseases of the musculoskeletal system and connective tissue: Secondary | ICD-10-CM | POA: Insufficient documentation

## 2013-08-07 DIAGNOSIS — IMO0002 Reserved for concepts with insufficient information to code with codable children: Secondary | ICD-10-CM | POA: Insufficient documentation

## 2013-08-07 DIAGNOSIS — F17200 Nicotine dependence, unspecified, uncomplicated: Secondary | ICD-10-CM | POA: Insufficient documentation

## 2013-08-07 DIAGNOSIS — Z880 Allergy status to penicillin: Secondary | ICD-10-CM | POA: Insufficient documentation

## 2013-08-07 DIAGNOSIS — Z9119 Patient's noncompliance with other medical treatment and regimen: Secondary | ICD-10-CM

## 2013-08-07 DIAGNOSIS — R079 Chest pain, unspecified: Secondary | ICD-10-CM | POA: Insufficient documentation

## 2013-08-07 DIAGNOSIS — Z79899 Other long term (current) drug therapy: Secondary | ICD-10-CM | POA: Insufficient documentation

## 2013-08-07 DIAGNOSIS — Z5320 Procedure and treatment not carried out because of patient's decision for unspecified reasons: Secondary | ICD-10-CM

## 2013-08-07 DIAGNOSIS — J441 Chronic obstructive pulmonary disease with (acute) exacerbation: Secondary | ICD-10-CM | POA: Insufficient documentation

## 2013-08-07 DIAGNOSIS — Z792 Long term (current) use of antibiotics: Secondary | ICD-10-CM | POA: Insufficient documentation

## 2013-08-07 DIAGNOSIS — M7989 Other specified soft tissue disorders: Secondary | ICD-10-CM | POA: Insufficient documentation

## 2013-08-07 LAB — BASIC METABOLIC PANEL
BUN: 7 mg/dL (ref 6–23)
CO2: 29 mEq/L (ref 19–32)
Calcium: 9.5 mg/dL (ref 8.4–10.5)
Chloride: 104 mEq/L (ref 96–112)
Creatinine, Ser: 0.74 mg/dL (ref 0.50–1.10)
GFR calc Af Amer: >90 mL/min (ref >90)
GFR calc non Af Amer: 89 mL/min — ABNORMAL LOW (ref >90)
Glucose, Bld: 94 mg/dL (ref 70–99)
Potassium: 3.8 mEq/L (ref 3.7–5.3)
Sodium: 144 mEq/L (ref 137–147)

## 2013-08-07 LAB — POCT I-STAT TROPONIN I: Troponin i, poc: 0 ng/mL (ref 0.00–0.08)

## 2013-08-07 LAB — CBC
HCT: 37.1 % (ref 36.0–46.0)
Hemoglobin: 12.3 g/dL (ref 12.0–15.0)
MCH: 27 pg (ref 26.0–34.0)
MCHC: 33.2 g/dL (ref 30.0–36.0)
MCV: 81.5 fL (ref 78.0–100.0)
Platelets: 194 10*3/uL (ref 150–400)
RBC: 4.55 MIL/uL (ref 3.87–5.11)
RDW: 13.9 % (ref 11.5–15.5)
WBC: 7.9 10*3/uL (ref 4.0–10.5)

## 2013-08-07 NOTE — ED Notes (Signed)
Called patient to a room w/o answer x 2

## 2013-08-07 NOTE — ED Notes (Signed)
Pt reports right knee swelling and thigh pain.

## 2013-08-07 NOTE — ED Notes (Signed)
Pt was being treated with pneumonia with two rounds of levaquin, but continues to have pain in her chest and MD felt like it was hard to hear air movement in her lungs.

## 2013-08-08 ENCOUNTER — Encounter (HOSPITAL_COMMUNITY): Payer: Self-pay | Admitting: Emergency Medicine

## 2013-08-08 ENCOUNTER — Emergency Department (HOSPITAL_COMMUNITY)
Admission: EM | Admit: 2013-08-08 | Discharge: 2013-08-08 | Disposition: A | Discharge status: Home / Self Care | Payer: Medicaid Other | Attending: Emergency Medicine | Admitting: Emergency Medicine

## 2013-08-08 ENCOUNTER — Emergency Department (HOSPITAL_COMMUNITY): Payer: Medicaid Other

## 2013-08-08 DIAGNOSIS — Z91040 Latex allergy status: Secondary | ICD-10-CM | POA: Insufficient documentation

## 2013-08-08 DIAGNOSIS — Z9109 Other allergy status, other than to drugs and biological substances: Secondary | ICD-10-CM | POA: Insufficient documentation

## 2013-08-08 DIAGNOSIS — IMO0001 Reserved for inherently not codable concepts without codable children: Secondary | ICD-10-CM | POA: Insufficient documentation

## 2013-08-08 DIAGNOSIS — R5383 Other fatigue: Secondary | ICD-10-CM

## 2013-08-08 DIAGNOSIS — Z23 Encounter for immunization: Secondary | ICD-10-CM | POA: Insufficient documentation

## 2013-08-08 DIAGNOSIS — Z792 Long term (current) use of antibiotics: Secondary | ICD-10-CM | POA: Insufficient documentation

## 2013-08-08 DIAGNOSIS — F17200 Nicotine dependence, unspecified, uncomplicated: Secondary | ICD-10-CM | POA: Insufficient documentation

## 2013-08-08 DIAGNOSIS — I10 Essential (primary) hypertension: Secondary | ICD-10-CM | POA: Insufficient documentation

## 2013-08-08 DIAGNOSIS — Z79899 Other long term (current) drug therapy: Secondary | ICD-10-CM | POA: Insufficient documentation

## 2013-08-08 DIAGNOSIS — IMO0002 Reserved for concepts with insufficient information to code with codable children: Secondary | ICD-10-CM | POA: Insufficient documentation

## 2013-08-08 DIAGNOSIS — J441 Chronic obstructive pulmonary disease with (acute) exacerbation: Secondary | ICD-10-CM | POA: Insufficient documentation

## 2013-08-08 DIAGNOSIS — G4700 Insomnia, unspecified: Secondary | ICD-10-CM | POA: Insufficient documentation

## 2013-08-08 DIAGNOSIS — Z8669 Personal history of other diseases of the nervous system and sense organs: Secondary | ICD-10-CM | POA: Insufficient documentation

## 2013-08-08 DIAGNOSIS — M069 Rheumatoid arthritis, unspecified: Secondary | ICD-10-CM | POA: Insufficient documentation

## 2013-08-08 DIAGNOSIS — R5381 Other malaise: Secondary | ICD-10-CM | POA: Insufficient documentation

## 2013-08-08 DIAGNOSIS — J029 Acute pharyngitis, unspecified: Secondary | ICD-10-CM

## 2013-08-08 DIAGNOSIS — Z880 Allergy status to penicillin: Secondary | ICD-10-CM | POA: Insufficient documentation

## 2013-08-08 DIAGNOSIS — J449 Chronic obstructive pulmonary disease, unspecified: Secondary | ICD-10-CM

## 2013-08-08 DIAGNOSIS — Z8701 Personal history of pneumonia (recurrent): Secondary | ICD-10-CM | POA: Insufficient documentation

## 2013-08-08 LAB — CBC WITH DIFFERENTIAL/PLATELET
Basophils Absolute: 0 10*3/uL (ref 0.0–0.1)
Basophils Relative: 0 % (ref 0–1)
Eosinophils Absolute: 0.2 10*3/uL (ref 0.0–0.7)
Eosinophils Relative: 3 % (ref 0–5)
HCT: 34.5 % — ABNORMAL LOW (ref 36.0–46.0)
Hemoglobin: 11.5 g/dL — ABNORMAL LOW (ref 12.0–15.0)
Lymphocytes Relative: 48 % — ABNORMAL HIGH (ref 12–46)
Lymphs Abs: 3.4 10*3/uL (ref 0.7–4.0)
MCH: 27 pg (ref 26.0–34.0)
MCHC: 33.3 g/dL (ref 30.0–36.0)
MCV: 81 fL (ref 78.0–100.0)
Monocytes Absolute: 0.6 10*3/uL (ref 0.1–1.0)
Monocytes Relative: 9 % (ref 3–12)
Neutro Abs: 2.8 10*3/uL (ref 1.7–7.7)
Neutrophils Relative %: 40 % — ABNORMAL LOW (ref 43–77)
Platelets: 181 10*3/uL (ref 150–400)
RBC: 4.26 MIL/uL (ref 3.87–5.11)
RDW: 13.6 % (ref 11.5–15.5)
WBC: 7.1 10*3/uL (ref 4.0–10.5)

## 2013-08-08 LAB — BASIC METABOLIC PANEL
BUN: 7 mg/dL (ref 6–23)
CO2: 25 mEq/L (ref 19–32)
Calcium: 9.3 mg/dL (ref 8.4–10.5)
Chloride: 107 mEq/L (ref 96–112)
Creatinine, Ser: 0.7 mg/dL (ref 0.50–1.10)
GFR calc Af Amer: >90 mL/min (ref >90)
GFR calc non Af Amer: >90 mL/min (ref >90)
Glucose, Bld: 86 mg/dL (ref 70–99)
Potassium: 3.9 mEq/L (ref 3.7–5.3)
Sodium: 143 mEq/L (ref 137–147)

## 2013-08-08 LAB — POCT I-STAT TROPONIN I: Troponin i, poc: 0.01 ng/mL (ref 0.00–0.08)

## 2013-08-08 LAB — PRO B NATRIURETIC PEPTIDE: Pro B Natriuretic peptide (BNP): 455.8 pg/mL — ABNORMAL HIGH (ref 0–125)

## 2013-08-08 MED ORDER — INFLUENZA VAC SPLIT QUAD 0.5 ML IM SUSP
0.5000 mL | INTRAMUSCULAR | Status: AC
  Administered 2013-08-08: 0.5 mL via INTRAMUSCULAR
  Filled 2013-08-08 (×2): qty 0.5

## 2013-08-08 NOTE — ED Provider Notes (Signed)
CSN: [id number]     Arrival date & time 08/08/13  7371 History  First MD Initiated Contact with Patient 08/08/13 1118     Chief Complaint  Patient presents with  . Shortness of Breath  . Sore Throat    HPI Pt has been having trouble with pain with swallowing and shortness of breath over the last month.  She had the flu last month and has been treated with pneumonia.  When she swallows she has pain in her throat.  She also has been coughing a lot.  She feels normal at rest but when she walks too much she gets tired and weak.  This has not improved with treatment over the last month.  Pt went to pain clinic yesterday and they felt like she might need to be admitted so she came to the ED today  Pt also mentions episodes of difficulty moving right side.  That has been off and on for 5 months. Not experiencing any of those symptoms currently.  Past Medical History  Diagnosis Date  . COPD (chronic obstructive pulmonary disease)   . Fibromyalgia   . Hypertension   . Insomnia   . Allergic rhinitis   . Neck pain   . Raynaud's disease   . Arthritis     rheumatoid   Past Surgical History  Procedure Laterality Date  . Abdominal hysterectomy    . Tubal ligation    . Hemorroidectomy    . Breast biopsy    . Lumbar laminectomy/decompression microdiscectomy  10/09/2011    Procedure: LUMBAR LAMINECTOMY/DECOMPRESSION MICRODISCECTOMY 1 LEVEL;  Surgeon: Erline Levine, MD;  Location: [facility];  Service: Neurosurgery;  Laterality: Right;  RIGHT Lumbar four-five foraminotomy with possible microdiskectomy   Family History  Problem Relation Age of Onset  . Rheum arthritis Mother   . Breast cancer Mother   . Breast cancer Sister   . Ovarian cancer Maternal Grandmother   . Rectal cancer Paternal Grandmother    History  Substance Use Topics  . Smoking status: Current Every Day Smoker -- 1.00 packs/day for 25 years    Types: Cigarettes  . Smokeless tobacco: Never Used  . Alcohol Use: No      Comment: Quit in 2001   OB History   Grav Para Term Preterm Abortions TAB SAB Ect Mult Living                 Review of Systems  All other systems reviewed and are negative.      Allergies  Celebrex; Doxycycline; Fentanyl; Lisinopril; Lyrica; Naproxen; Nsaids; Prednisone; Sumatriptan; Estradiol; Fish allergy; Methotrexate derivatives; Latex; Penicillins; and Sulfa antibiotics  Home Medications   Current Outpatient Rx  Name  Route  Sig  Dispense  Refill  . adalimumab (HUMIRA PEN) 40 MG/0.8ML injection   Subcutaneous   Inject 40 mg into the skin every 14 (fourteen) days.         Marland Kitchen albuterol (PROVENTIL HFA;VENTOLIN HFA) 108 (90 BASE) MCG/ACT inhaler   Inhalation   Inhale 2 puffs into the lungs 3 (three) times daily as needed. For shortness of breath          . albuterol (PROVENTIL) (2.5 MG/3ML) 0.083% nebulizer solution   Nebulization   Take 2.5 mg by nebulization every 6 (six) hours as needed for wheezing or shortness of breath.         Marland Kitchen amitriptyline (ELAVIL) 150 MG tablet   Oral   Take 150 mg by mouth at bedtime.           Marland Kitchen  bisoprolol (ZEBETA) 5 MG tablet   Oral   Take 5 mg by mouth daily.         . cetirizine (ZYRTEC) 10 MG tablet   Oral   Take 10 mg by mouth daily.         Marland Kitchen Dextromethorphan-Guaifenesin (MUCINEX DM PO)   Oral   Take 15 mLs by mouth every 4 (four) hours as needed (cold).         . diazepam (VALIUM) 5 MG tablet   Oral   Take 5 mg by mouth daily as needed for anxiety or sleep.         Marland Kitchen Emollient (CERAVE) CREA   Apply externally   Apply 1 application topically 2 (two) times daily as needed (break outs).          . esomeprazole (NEXIUM) 40 MG capsule   Oral   Take 40 mg by mouth 2 (two) times daily.           . famotidine (PEPCID) 20 MG tablet   Oral   Take 20 mg by mouth at bedtime. With nexium         . lidocaine (XYLOCAINE) 2 % solution   Mouth/Throat   Use as directed 20 mLs in the mouth or throat every 4  (four) hours as needed for mouth pain.         . montelukast (SINGULAIR) 10 MG tablet   Oral   Take 10 mg by mouth at bedtime.           . Olopatadine HCl (PATADAY) 0.2 % SOLN   Both Eyes   Place 1 drop into both eyes 2 (two) times daily.          Marland Kitchen oxyCODONE-acetaminophen (PERCOCET) 10-325 MG per tablet   Oral   Take 0.5 tablets by mouth every 6 (six) hours as needed. For pain   12 tablet   0   . polyethylene glycol (MIRALAX / GLYCOLAX) packet   Oral   Take 17 g by mouth daily as needed for mild constipation.         . topiramate (TOPAMAX) 50 MG tablet   Oral   Take 50 mg by mouth as needed (mirgaine).          . triamcinolone cream (KENALOG) 0.1 %   Topical   Apply topically 3 (three) times daily.   454 g   2   . zolpidem (AMBIEN) 10 MG tablet   Oral   Take 5-10 mg by mouth at bedtime as needed for sleep.          Marland Kitchen levofloxacin (LEVAQUIN) 500 MG tablet   Oral   Take 1 tablet (500 mg total) by mouth daily.   5 tablet   0    BP 144/74  Pulse 74  Temp(Src) 98.2 F (36.8 C) (Oral)  Resp 20  Ht 5\' 6"  (1.676 m)  Wt 189 lb (85.73 kg)  BMI 30.52 kg/m2  SpO2 96% Physical Exam  Nursing note and vitals reviewed. Constitutional: She appears well-developed and well-nourished. No distress.  HENT:  Head: Normocephalic and atraumatic.  Right Ear: External ear normal.  Left Ear: External ear normal.  Mouth/Throat: No oropharyngeal exudate (mild erythema).  Eyes: Conjunctivae are normal. Right eye exhibits no discharge. Left eye exhibits no discharge. No scleral icterus.  Neck: Neck supple. No tracheal deviation present.  Cardiovascular: Normal rate, regular rhythm and intact distal pulses.   Pulmonary/Chest: Effort normal and breath sounds normal. No  stridor. No respiratory distress. She has no wheezes. She has no rales.  Able to speak in full sentences  Abdominal: Soft. Bowel sounds are normal. She exhibits no distension. There is no tenderness. There is  no rebound and no guarding.  Musculoskeletal: She exhibits tenderness (in her joints, no erythema). She exhibits no edema.  Neurological: She is alert. She has normal strength. No cranial nerve deficit (no facial droop, extraocular movements intact, no slurred speech) or sensory deficit. She exhibits normal muscle tone. She displays no seizure activity. Coordination normal.  Skin: Skin is warm and dry. No rash noted.  Psychiatric: She has a normal mood and affect.    ED Course  Procedures (including critical care time) Labs Review Labs Reviewed  PRO B NATRIURETIC PEPTIDE - Abnormal; Notable for the following:    Pro B Natriuretic peptide (BNP) 455.8 (*)    All other components within normal limits  CBC WITH DIFFERENTIAL - Abnormal; Notable for the following:    Hemoglobin 11.5 (*)    HCT 34.5 (*)    Neutrophils Relative % 40 (*)    Lymphocytes Relative 48 (*)    All other components within normal limits  BASIC METABOLIC PANEL  CBC WITH DIFFERENTIAL  POCT I-STAT TROPONIN I   Imaging Review Dg Chest 2 View (if Patient Has Fever And/or Copd)  08/07/2013   CLINICAL DATA:  Chest pain.  EXAM: CHEST  2 VIEW  COMPARISON:  July 17, 2013.  FINDINGS: The heart size and mediastinal contours are within normal limits. Both lungs are clear. No pneumothorax or pleural effusion is noted. Lingular opacity seen on lateral radiograph of prior study is not well visualized currently. The visualized skeletal structures are unremarkable.  IMPRESSION: No active cardiopulmonary disease.   Electronically Signed   By: Sabino Dick M.D.   On: 08/07/2013 14:46   Ct Head Wo Contrast  08/08/2013   CLINICAL DATA:  Intermittent right-sided weakness  EXAM: CT HEAD WITHOUT CONTRAST  TECHNIQUE: Contiguous axial images were obtained from the base of the skull through the vertex without intravenous contrast.  COMPARISON:  Prior CT from 01/19/2013  FINDINGS: Mild age-related atrophy with moderate chronic microvascular  ischemic changes again noted, unchanged.  There is no acute intracranial hemorrhage or infarct. No mass lesion or midline shift. Gray-white matter differentiation is well maintained. Ventricles are normal in size without evidence of hydrocephalus. No extra-axial fluid collection.  The calvarium is intact.  Orbital soft tissues are within normal limits.  Moderate mucoperiosteal thickening present within the ethmoidal air cells with layering opacity within the partially visualized left maxillary sinus. Scattered mucosal thickening noted within the right frontal sinus and right frontoethmoidal recess.  Scalp soft tissues are unremarkable.  IMPRESSION: 1. No acute intracranial process. 2. Moderate paranasal sinus disease as above.   Electronically Signed   By: Jeannine Boga M.D.   On: 08/08/2013 12:33    EKG Interpretation    Date/Time:  Tuesday August 08 2013 09:49:43 EST Ventricular Rate:  75 PR Interval:  156 QRS Duration: 80 QT Interval:  360 QTC Calculation: 402 R Axis:   19 Text Interpretation:  Normal sinus rhythm Nonspecific ST and T wave abnormality Abnormal ECG No significant change since last tracing Confirmed by Saranne Crislip  MD-J, Amandeep Nesmith ([id number]) on 08/08/2013 11:25:21 AM            MDM   Final diagnoses:  COPD (chronic obstructive pulmonary disease)  Pharyngitis   Patient is not wheezing in the emergency department. Her  respiratory rate, oxygen saturation and vital signs are normal. Symptoms she has described has been ongoing for an extended period of time. At this time there is no evidence of acute abnormality. I reviewed the chest x-ray the patient had yesterday which was unremarkable.  Recommend follow up with her primary care doctor. She should continue her current medications.    Kathalene Frames, MD 08/08/13 [phone]

## 2013-08-08 NOTE — ED Provider Notes (Signed)
CSN: [id number]     Arrival date & time 08/07/13  1324 History   First MD Initiated Contact with Patient 08/07/13 2017     Chief Complaint  Patient presents with  . Leg Swelling  . Shortness of Breath  . Chest Pain     (Consider location/radiation/quality/duration/timing/severity/associated sxs/prior Treatment) HPI  Past Medical History  Diagnosis Date  . COPD (chronic obstructive pulmonary disease)   . Fibromyalgia   . Hypertension   . Insomnia   . Allergic rhinitis   . Neck pain   . Raynaud's disease   . Arthritis     rheumatoid   Past Surgical History  Procedure Laterality Date  . Abdominal hysterectomy    . Tubal ligation    . Hemorroidectomy    . Breast biopsy    . Lumbar laminectomy/decompression microdiscectomy  10/09/2011    Procedure: LUMBAR LAMINECTOMY/DECOMPRESSION MICRODISCECTOMY 1 LEVEL;  Surgeon: Erline Levine, MD;  Location: [facility];  Service: Neurosurgery;  Laterality: Right;  RIGHT Lumbar four-five foraminotomy with possible microdiskectomy   Family History  Problem Relation Age of Onset  . Rheum arthritis Mother   . Breast cancer Mother   . Breast cancer Sister   . Ovarian cancer Maternal Grandmother   . Rectal cancer Paternal Grandmother    History  Substance Use Topics  . Smoking status: Current Every Day Smoker -- 1.00 packs/day for 25 years    Types: Cigarettes  . Smokeless tobacco: Never Used  . Alcohol Use: No     Comment: Quit in 2001   OB History   Grav Para Term Preterm Abortions TAB SAB Ect Mult Living                 Review of Systems    Allergies  Celebrex; Doxycycline; Fentanyl; Lisinopril; Lyrica; Naproxen; Nsaids; Prednisone; Sumatriptan; Estradiol; Fish allergy; Methotrexate derivatives; Latex; Penicillins; and Sulfa antibiotics  Home Medications   Current Outpatient Rx  Name  Route  Sig  Dispense  Refill  . adalimumab (HUMIRA PEN) 40 MG/0.8ML injection   Subcutaneous   Inject 40 mg into the skin every 14  (fourteen) days.         Marland Kitchen albuterol (PROVENTIL HFA;VENTOLIN HFA) 108 (90 BASE) MCG/ACT inhaler   Inhalation   Inhale 2 puffs into the lungs 3 (three) times daily as needed. For shortness of breath          . amitriptyline (ELAVIL) 150 MG tablet   Oral   Take 150 mg by mouth at bedtime.           . bisoprolol (ZEBETA) 5 MG tablet   Oral   Take 5 mg by mouth daily.         . diazepam (VALIUM) 5 MG tablet   Oral   Take 5 mg by mouth daily as needed for anxiety or sleep.         Marland Kitchen Emollient (CERAVE) CREA   Apply externally   Apply 1 application topically 2 (two) times daily.         Marland Kitchen esomeprazole (NEXIUM) 40 MG capsule   Oral   Take 40 mg by mouth 2 (two) times daily.           . famotidine (PEPCID) 20 MG tablet   Oral   Take 20 mg by mouth at bedtime. With nexium         . levofloxacin (LEVAQUIN) 500 MG tablet   Oral   Take 1 tablet (500 mg total) by  mouth daily.   5 tablet   0   . montelukast (SINGULAIR) 10 MG tablet   Oral   Take 10 mg by mouth at bedtime.           . Olopatadine HCl (PATADAY) 0.2 % SOLN   Both Eyes   Place 1 drop into both eyes 2 (two) times daily.          Marland Kitchen oseltamivir (TAMIFLU) 75 MG capsule   Oral   Take 1 capsule (75 mg total) by mouth every 12 (twelve) hours.   10 capsule   0   . oxyCODONE-acetaminophen (PERCOCET) 10-325 MG per tablet   Oral   Take 0.5 tablets by mouth every 6 (six) hours as needed. For pain   12 tablet   0   . topiramate (TOPAMAX) 50 MG tablet   Oral   Take 50 mg by mouth as needed (mirgaine).          . triamcinolone cream (KENALOG) 0.1 %   Topical   Apply topically 3 (three) times daily.   454 g   2   . zolpidem (AMBIEN) 10 MG tablet   Oral   Take 10 mg by mouth at bedtime as needed for sleep.           BP 126/76  Pulse 72  Temp(Src) 98.3 F (36.8 C) (Oral)  Resp 22  SpO2 97% Physical Exam  ED Course  Procedures (including critical care time) Labs Review Labs Reviewed   BASIC METABOLIC PANEL - Abnormal; Notable for the following:    GFR calc non Af Amer 89 (*)    All other components within normal limits  CBC  POCT I-STAT TROPONIN I   Imaging Review Dg Chest 2 View (if Patient Has Fever And/or Copd)  08/07/2013   CLINICAL DATA:  Chest pain.  EXAM: CHEST  2 VIEW  COMPARISON:  July 17, 2013.  FINDINGS: The heart size and mediastinal contours are within normal limits. Both lungs are clear. No pneumothorax or pleural effusion is noted. Lingular opacity seen on lateral radiograph of prior study is not well visualized currently. The visualized skeletal structures are unremarkable.  IMPRESSION: No active cardiopulmonary disease.   Electronically Signed   By: Sabino Dick M.D.   On: 08/07/2013 14:46    EKG Interpretation   None       MDM   Final diagnoses:  None    Pt left before being seen after triage. I did not examine pt. CXR, trop, CBC, BMP ordered from triage unremarkable.     Neta Ehlers, MD 08/08/13 0040

## 2013-08-08 NOTE — ED Notes (Signed)
Notified [facility] tropin was sent to them.

## 2013-08-08 NOTE — ED Notes (Signed)
States was here last night and  Still having sore throat  And sob

## 2013-08-08 NOTE — Discharge Instructions (Signed)
Chronic Obstructive Pulmonary Disease  Chronic obstructive pulmonary disease (COPD) is a common lung condition in which airflow from the lungs is limited. COPD is a general term that can be used to describe many different lung problems that limit airflow, including both chronic bronchitis and emphysema.  If you have COPD, your lung function will probably never return to normal, but there are measures you can take to improve lung function and make yourself feel better.   CAUSES   · Smoking (common).    · Exposure to secondhand smoke.    · Genetic problems.  · Chronic inflammatory lung diseases or recurrent infections.  SYMPTOMS   · Shortness of breath, especially with physical activity.    · Deep, persistent (chronic) cough with a large amount of thick mucus.    · Wheezing.    · Rapid breaths (tachypnea).    · Gray or bluish discoloration (cyanosis) of the skin, especially in fingers, toes, or lips.    · Fatigue.    · Weight loss.    · Frequent infections or episodes when breathing symptoms become much worse (exacerbations).    · Chest tightness.  DIAGNOSIS   Your healthcare provider will take a medical history and perform a physical examination to make the initial diagnosis.  Additional tests for COPD may include:   · Lung (pulmonary) function tests.  · Chest X-ray.  · CT scan.  · Blood tests.  TREATMENT   Treatment available to help you feel better when you have COPD include:   · Inhaler and nebulizer medicines. These help manage the symptoms of COPD and make your breathing more comfortable  · Supplemental oxygen. Supplemental oxygen is only helpful if you have a low oxygen level in your blood.    · Exercise and physical activity. These are beneficial for nearly all people with COPD. Some people may also benefit from a pulmonary rehabilitation program.  HOME CARE INSTRUCTIONS   · Take all medicines (inhaled or pills) as directed by your health care provider.  · Only take over-the-counter or prescription medicines  for pain, fever, or discomfort as directed by your health care provider.    · Avoid over-the-counter medicines or cough syrups that dry up your airway (such as antihistamines) and slow down the elimination of secretions unless instructed otherwise by your healthcare provider.    · If you are a smoker, the most important thing that you can do is stop smoking. Continuing to smoke will cause further lung damage and breathing trouble. Ask your health care provider for help with quitting smoking. He or she can direct you to community resources or hospitals that provide support.  · Avoid exposure to irritants such as smoke, chemicals, and fumes that aggravate your breathing.  · Use oxygen therapy and pulmonary rehabilitation if directed by your health care provider. If you require home oxygen therapy, ask your healthcare provider whether you should purchase a pulse oximeter to measure your oxygen level at home.    · Avoid contact with individuals who have a contagious illness.  · Avoid extreme temperature and humidity changes.  · Eat healthy foods. Eating smaller, more frequent meals and resting before meals may help you maintain your strength.  · Stay active, but balance activity with periods of rest. Exercise and physical activity will help you maintain your ability to do things you want to do.  · Preventing infection and hospitalization is very important when you have COPD. Make sure to receive all the vaccines your health care provider recommends, especially the pneumococcal and influenza vaccines. Ask your healthcare provider whether you   need a pneumonia vaccine.  · Learn and use relaxation techniques to manage stress.  · Learn and use controlled breathing techniques as directed by your health care provider. Controlled breathing techniques include:    · Pursed lip breathing. Start by breathing in (inhaling) through your nose for 1 second. Then, purse your lips as if you were going to whistle and breathe out (exhale)  through the pursed lips for 2 seconds.    · Diaphragmatic breathing. Start by putting one hand on your abdomen just above your waist. Inhale slowly through your nose. The hand on your abdomen should move out. Then purse your lips and exhale slowly. You should be able to feel the hand on your abdomen moving in as you exhale.    · Learn and use controlled coughing to clear mucus from your lungs. Controlled coughing is a series of short, progressive coughs. The steps of controlled coughing are:    1. Lean your head slightly forward.    2. Breathe in deeply using diaphragmatic breathing.    3. Try to hold your breath for 3 seconds.    4. Keep your mouth slightly open while coughing twice.    5. Spit any mucus out into a tissue.    6. Rest and repeat the steps once or twice as needed.  SEEK MEDICAL CARE IF:   · You are coughing up more mucus than usual.    · There is a change in the color or thickness of your mucus.    · Your breathing is more labored than usual.    · Your breathing is faster than usual.    SEEK IMMEDIATE MEDICAL CARE IF:   · You have shortness of breath while you are resting.    · You have shortness of breath that prevents you from:  · Being able to talk.    · Performing your usual physical activities.    · You have chest pain lasting longer than 5 minutes.    · Your skin color is more cyanotic than usual.  · You measure low oxygen saturations for longer than 5 minutes with a pulse oximeter.  MAKE SURE YOU:   · Understand these instructions.  · Will watch your condition.  · Will get help right away if you are not doing well or get worse.  Document Released: 03/25/2005 Document Revised: 04/05/2013 Document Reviewed: 02/09/2013  [employer]® Patient Information ©2014 [employer].

## 2013-10-03 ENCOUNTER — Encounter (HOSPITAL_COMMUNITY): Payer: Self-pay | Admitting: Emergency Medicine

## 2013-10-03 ENCOUNTER — Emergency Department (HOSPITAL_COMMUNITY)
Admission: EM | Admit: 2013-10-03 | Discharge: 2013-10-03 | Disposition: A | Discharge status: Home / Self Care | Payer: Medicaid Other | Attending: Emergency Medicine | Admitting: Emergency Medicine

## 2013-10-03 ENCOUNTER — Emergency Department (HOSPITAL_COMMUNITY): Payer: Medicaid Other

## 2013-10-03 DIAGNOSIS — J449 Chronic obstructive pulmonary disease, unspecified: Secondary | ICD-10-CM | POA: Insufficient documentation

## 2013-10-03 DIAGNOSIS — J4489 Other specified chronic obstructive pulmonary disease: Secondary | ICD-10-CM | POA: Insufficient documentation

## 2013-10-03 DIAGNOSIS — Z8739 Personal history of other diseases of the musculoskeletal system and connective tissue: Secondary | ICD-10-CM | POA: Insufficient documentation

## 2013-10-03 DIAGNOSIS — I10 Essential (primary) hypertension: Secondary | ICD-10-CM | POA: Insufficient documentation

## 2013-10-03 DIAGNOSIS — Z79899 Other long term (current) drug therapy: Secondary | ICD-10-CM | POA: Insufficient documentation

## 2013-10-03 DIAGNOSIS — Z91040 Latex allergy status: Secondary | ICD-10-CM | POA: Insufficient documentation

## 2013-10-03 DIAGNOSIS — G8929 Other chronic pain: Secondary | ICD-10-CM | POA: Insufficient documentation

## 2013-10-03 DIAGNOSIS — F17200 Nicotine dependence, unspecified, uncomplicated: Secondary | ICD-10-CM | POA: Insufficient documentation

## 2013-10-03 DIAGNOSIS — IMO0002 Reserved for concepts with insufficient information to code with codable children: Secondary | ICD-10-CM | POA: Insufficient documentation

## 2013-10-03 DIAGNOSIS — Z880 Allergy status to penicillin: Secondary | ICD-10-CM | POA: Insufficient documentation

## 2013-10-03 DIAGNOSIS — M25462 Effusion, left knee: Secondary | ICD-10-CM

## 2013-10-03 DIAGNOSIS — M25469 Effusion, unspecified knee: Secondary | ICD-10-CM | POA: Insufficient documentation

## 2013-10-03 DIAGNOSIS — Z8709 Personal history of other diseases of the respiratory system: Secondary | ICD-10-CM | POA: Insufficient documentation

## 2013-10-03 DIAGNOSIS — Z8679 Personal history of other diseases of the circulatory system: Secondary | ICD-10-CM | POA: Insufficient documentation

## 2013-10-03 MED ORDER — OXYCODONE-ACETAMINOPHEN 5-325 MG PO TABS
2.0000 | ORAL_TABLET | Freq: Once | ORAL | Status: DC
  Filled 2013-10-03: qty 2

## 2013-10-03 NOTE — ED Notes (Signed)
Called ortho to come place immobilizer.

## 2013-10-03 NOTE — ED Notes (Signed)
Pt complaining of chronic left knee pain. sts she received an injection in the knee by her Rheumatologist but not getting any better. sts it is swelling and popping.

## 2013-10-03 NOTE — Discharge Instructions (Signed)
Knee Effusion  Knee effusion means you have fluid in your knee. The knee may be more difficult to bend and move. HOME CARE  Use crutches or a brace as told by your doctor.  Put ice on the injured area.  Put ice in a plastic bag.  Place a towel between your skin and the bag.  Leave the ice on for 15-20 minutes, 03-04 times a day.  Raise (elevate) your knee as much as possible.  Only take medicine as told by your doctor.  You may need to do strengthening exercises. Ask your doctor.  Continue with your normal diet and activities as told by your doctor. GET HELP RIGHT AWAY IF:  You have more puffiness (swelling) in your knee.  You see redness, puffiness, or have more pain in your knee.  You have a temperature by mouth above 102 F (38.9 C).  You get a rash.  You have trouble breathing.  You have a reaction to any medicine you are taking.  You have a lot of pain when you move your knee. MAKE SURE YOU:  Understand these instructions.  Will watch your condition.  Will get help right away if you are not doing well or get worse. Document Released: 07/18/2010 Document Revised: 09/07/2011 Document Reviewed: 07/18/2010 [employer] Patient Information 2014 [employer].  

## 2013-10-03 NOTE — ED Provider Notes (Signed)
CSN: [id number]     Arrival date & time 10/03/13  2025 History  This chart was scribed for non-physician practitioner working with Evalee Jefferson, by Allena Earing ED Scribe. This patient was seen in TR10C/TR10C and the patient's care was started at 10:59 AM.    Chief Complaint  Patient presents with  . Knee Pain     The history is provided by the patient. No language interpreter was used.    HPI Comments: Denise Simmons is a 63 y.o. female with h/o arthritis, Raynaud's disease who presents to the Emergency Department complaining of chronic left knee pain, she reports "cracking, popping". She reports that the pain began when she fell around 2 months ago, she landed on the left knee. Pt reports that the fall was preceded by an episode of numbness in her left leg which she frequently gets secondary to her fibromyalgia. Pt states that her left knee had been her "good knee" prior to the fall. She reports that it pops with "just normal walking". 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 Pt reports that percocet does not provided effective relief of pain for her knee which she receives from her pain management doctor.  Otc Tiger Balm does help. She has received steroid injections in the knee recently by her rheumotogist without relief.  She has not followed up with her provider regarding her lack of response to this treatment.  Pt has been treating it with ice, heat and Tiger Balm.        Past Medical History  Diagnosis Date  . COPD (chronic obstructive pulmonary disease)   . Fibromyalgia   . Hypertension   . Insomnia   . Allergic rhinitis   . Neck pain   . Raynaud's disease   . Arthritis     rheumatoid   Past Surgical History  Procedure Laterality Date  . Abdominal hysterectomy    . Tubal ligation    . Hemorroidectomy    . Breast biopsy    . Lumbar laminectomy/decompression microdiscectomy  10/09/2011    Procedure: LUMBAR LAMINECTOMY/DECOMPRESSION MICRODISCECTOMY 1 LEVEL;  Surgeon:  Erline Levine, MD;  Location: [facility];  Service: Neurosurgery;  Laterality: Right;  RIGHT Lumbar four-five foraminotomy with possible microdiskectomy   Family History  Problem Relation Age of Onset  . Rheum arthritis Mother   . Breast cancer Mother   . Breast cancer Sister   . Ovarian cancer Maternal Grandmother   . Rectal cancer Paternal Grandmother    History  Substance Use Topics  . Smoking status: Current Every Day Smoker -- 1.00 packs/day for 25 years    Types: Cigarettes  . Smokeless tobacco: Never Used  . Alcohol Use: No     Comment: Quit in 2001   OB History   Grav Para Term Preterm Abortions TAB SAB Ect Mult Living                 Review of Systems  Constitutional: Negative for fever and chills.  Musculoskeletal: Positive for arthralgias, gait problem and joint swelling. Negative for myalgias.  Neurological: Negative for weakness and numbness.      Allergies  Celebrex; Doxycycline; Fentanyl; Lisinopril; Lyrica; Naproxen; Nsaids; Prednisone; Sumatriptan; Estradiol; Fish allergy; Methotrexate derivatives; Latex; Penicillins; and Sulfa antibiotics  Home Medications   Current Outpatient Rx  Name  Route  Sig  Dispense  Refill  . adalimumab (HUMIRA PEN) 40 MG/0.8ML injection   Subcutaneous   Inject 40 mg into the skin every 14 (fourteen) days.         Marland Kitchen  albuterol (PROVENTIL HFA;VENTOLIN HFA) 108 (90 BASE) MCG/ACT inhaler   Inhalation   Inhale 2 puffs into the lungs 3 (three) times daily as needed. For shortness of breath          . albuterol (PROVENTIL) (2.5 MG/3ML) 0.083% nebulizer solution   Nebulization   Take 2.5 mg by nebulization every 6 (six) hours as needed for wheezing or shortness of breath.         Marland Kitchen amitriptyline (ELAVIL) 150 MG tablet   Oral   Take 150 mg by mouth at bedtime.           . bisoprolol (ZEBETA) 5 MG tablet   Oral   Take 5 mg by mouth daily.         . cetirizine (ZYRTEC) 10 MG tablet   Oral   Take 10 mg by mouth  daily.         . diazepam (VALIUM) 5 MG tablet   Oral   Take 5 mg by mouth daily as needed for anxiety or sleep.         Marland Kitchen Emollient (CERAVE) CREA   Apply externally   Apply 1 application topically 2 (two) times daily as needed (break outs).          . esomeprazole (NEXIUM) 40 MG capsule   Oral   Take 40 mg by mouth 2 (two) times daily.           . famotidine (PEPCID) 20 MG tablet   Oral   Take 20 mg by mouth at bedtime. With nexium         . montelukast (SINGULAIR) 10 MG tablet   Oral   Take 10 mg by mouth at bedtime.           . Olopatadine HCl (PATADAY) 0.2 % SOLN   Both Eyes   Place 1 drop into both eyes 2 (two) times daily.          Marland Kitchen oxyCODONE-acetaminophen (PERCOCET) 10-325 MG per tablet   Oral   Take 0.5 tablets by mouth every 6 (six) hours as needed. For pain   12 tablet   0   . polyethylene glycol (MIRALAX / GLYCOLAX) packet   Oral   Take 17 g by mouth daily as needed for mild constipation.         . topiramate (TOPAMAX) 50 MG tablet   Oral   Take 50 mg by mouth as needed (mirgaine).          . triamcinolone cream (KENALOG) 0.1 %   Topical   Apply topically 3 (three) times daily.   454 g   2   . zolpidem (AMBIEN) 10 MG tablet   Oral   Take 5-10 mg by mouth at bedtime as needed for sleep.           BP 140/73  Pulse 68  Temp(Src) 98.3 F (36.8 C)  Resp 18  SpO2 100% Physical Exam  Nursing note and vitals reviewed. Constitutional: She appears well-developed and well-nourished.  HENT:  Head: Atraumatic.  Neck: Normal range of motion.  Cardiovascular:  Pulses equal bilaterally  Musculoskeletal: She exhibits tenderness.       Left knee: She exhibits no effusion and no erythema.  Left Knee Pain with ROM Mild effusion, no erythema or increased warmth Did not appreciate crepitus with ROM Has mild edema when compared with right knee  Neurological: She is alert. She has normal strength. She displays normal reflexes. No sensory  deficit.  Skin: Skin is warm and dry.  Psychiatric: She has a normal mood and affect.    ED Course  Procedures (including critical care time)  DIAGNOSTIC STUDIES: Oxygen Saturation is 100% on RA, normal by my interpretation.    COORDINATION OF CARE:   11:02 AM-Discussed treatment plan which includes XRAY with pt at bedside and pt agreed to plan.   Labs Review Labs Reviewed - No data to display Imaging Review Dg Knee Complete 4 Views Left  10/03/2013   CLINICAL DATA:  Chronic knee pain  EXAM: LEFT KNEE - COMPLETE 4+ VIEW  COMPARISON:  None.  FINDINGS: Small joint effusion. Mild patellar spurring. Joint spaces are maintained.  Negative for fracture.  IMPRESSION: Knee joint effusion. Negative for fracture. Mild patellar spurring.   Electronically Signed   By: Franchot Gallo M.D.   On: 10/03/2013 11:51     EKG Interpretation None      MDM   Final diagnoses:  Knee effusion, left    Patients labs and/or radiological studies were viewed and considered during the medical decision making and disposition process. Pt was placed in knee immobilizer, she has crutches but prefers cane for limiting weight bearing.  Encouraged elevation, heat.  Given a dose of oxycodone here,  Will defer to primary and/or pain management providers for further pain management.  She was also encouraged f/u with her rheumatologist for her chronic pain.  Also referred to Dr Alvan Dame, as pt states she has never been evaluated by ortho for this injury.  Advised she may require further imaging if edema and effusion persists.  I personally performed the services described in this documentation, which was scribed in my presence. The recorded information has been reviewed and is accurate.    Evalee Jefferson, PA-C 10/04/13 1009

## 2013-10-04 NOTE — ED Provider Notes (Signed)
Medical screening examination/treatment/procedure(s) were performed by non-physician practitioner and as supervising physician I was immediately available for consultation/collaboration.   EKG Interpretation None        Ephraim Hamburger, MD 10/04/13 [phone]

## 2013-10-15 ENCOUNTER — Encounter (HOSPITAL_COMMUNITY): Payer: Self-pay | Admitting: Emergency Medicine

## 2013-10-15 ENCOUNTER — Emergency Department (HOSPITAL_COMMUNITY)
Admission: EM | Admit: 2013-10-15 | Discharge: 2013-10-15 | Disposition: A | Discharge status: Home / Self Care | Payer: Medicaid Other | Attending: Emergency Medicine | Admitting: Emergency Medicine

## 2013-10-15 DIAGNOSIS — M549 Dorsalgia, unspecified: Secondary | ICD-10-CM | POA: Insufficient documentation

## 2013-10-15 DIAGNOSIS — J4489 Other specified chronic obstructive pulmonary disease: Secondary | ICD-10-CM | POA: Insufficient documentation

## 2013-10-15 DIAGNOSIS — J449 Chronic obstructive pulmonary disease, unspecified: Secondary | ICD-10-CM | POA: Insufficient documentation

## 2013-10-15 DIAGNOSIS — Z91040 Latex allergy status: Secondary | ICD-10-CM | POA: Insufficient documentation

## 2013-10-15 DIAGNOSIS — IMO0001 Reserved for inherently not codable concepts without codable children: Secondary | ICD-10-CM | POA: Insufficient documentation

## 2013-10-15 DIAGNOSIS — F17200 Nicotine dependence, unspecified, uncomplicated: Secondary | ICD-10-CM | POA: Insufficient documentation

## 2013-10-15 DIAGNOSIS — M109 Gout, unspecified: Secondary | ICD-10-CM | POA: Insufficient documentation

## 2013-10-15 DIAGNOSIS — I10 Essential (primary) hypertension: Secondary | ICD-10-CM | POA: Insufficient documentation

## 2013-10-15 DIAGNOSIS — M2550 Pain in unspecified joint: Secondary | ICD-10-CM

## 2013-10-15 DIAGNOSIS — IMO0002 Reserved for concepts with insufficient information to code with codable children: Secondary | ICD-10-CM | POA: Insufficient documentation

## 2013-10-15 DIAGNOSIS — Z880 Allergy status to penicillin: Secondary | ICD-10-CM | POA: Insufficient documentation

## 2013-10-15 DIAGNOSIS — M069 Rheumatoid arthritis, unspecified: Secondary | ICD-10-CM | POA: Insufficient documentation

## 2013-10-15 DIAGNOSIS — Z79899 Other long term (current) drug therapy: Secondary | ICD-10-CM | POA: Insufficient documentation

## 2013-10-15 MED ORDER — METHYLPREDNISOLONE 4 MG PO KIT: PACK | ORAL | Status: DC

## 2013-10-15 MED ORDER — MORPHINE SULFATE 10 MG/ML IJ SOLN
10.0000 mg | Freq: Once | INTRAMUSCULAR | Status: AC
  Administered 2013-10-15: 10 mg via INTRAMUSCULAR
  Filled 2013-10-15: qty 1

## 2013-10-15 MED ORDER — OXYCODONE-ACETAMINOPHEN 5-325 MG PO TABS: 2.0000 | ORAL_TABLET | ORAL | Status: DC | PRN

## 2013-10-15 MED ORDER — PROMETHAZINE HCL 25 MG/ML IJ SOLN
25.0000 mg | Freq: Once | INTRAMUSCULAR | Status: AC
  Administered 2013-10-15: 25 mg via INTRAMUSCULAR
  Filled 2013-10-15: qty 1

## 2013-10-15 NOTE — ED Notes (Addendum)
Per ems: the patient fell in march to her left knee. The ptient is now having pain to her left knee and left leg. The patient is a client of the pain client and missed her last appointment due to "pain".  Patient is hypertensive due to the fact that she has not taken her BP meds today because she has been in too much pain to take them. The patient is taking the pain medications that she was given for the RA rather than the medications for this pain. The patient has just been treated with steroids. Uses a Wheel chair and crutches at home. She is scheduled for an MRI through her ortho dr.

## 2013-10-15 NOTE — Discharge Instructions (Signed)
Take your colchicine at home as prescribed. Call your rheumatologist tomorrow for er check appointment.  Arthralgia Your caregiver has diagnosed you as suffering from an arthralgia. Arthralgia means there is pain in a joint. This can come from many reasons including:  Bruising the joint which causes soreness (inflammation) in the joint.  Wear and tear on the joints which occur as we grow older (osteoarthritis).  Overusing the joint.  Various forms of arthritis.  Infections of the joint. Regardless of the cause of pain in your joint, most of these different pains respond to anti-inflammatory drugs and rest. The exception to this is when a joint is infected, and these cases are treated with antibiotics, if it is a bacterial infection. HOME CARE INSTRUCTIONS   Rest the injured area for as long as directed by your caregiver. Then slowly start using the joint as directed by your caregiver and as the pain allows. Crutches as directed may be useful if the ankles, knees or hips are involved. If the knee was splinted or casted, continue use and care as directed. If an stretchy or elastic wrapping bandage has been applied today, it should be removed and re-applied every 3 to 4 hours. It should not be applied tightly, but firmly enough to keep swelling down. Watch toes and feet for swelling, bluish discoloration, coldness, numbness or excessive pain. If any of these problems (symptoms) occur, remove the ace bandage and re-apply more loosely. If these symptoms persist, contact your caregiver or return to this location.  For the first 24 hours, keep the injured extremity elevated on pillows while lying down.  Apply ice for 15-20 minutes to the sore joint every couple hours while awake for the first half day. Then 03-04 times per day for the first 48 hours. Put the ice in a plastic bag and place a towel between the bag of ice and your skin.  Wear any splinting, casting, elastic bandage applications, or  slings as instructed.  Only take over-the-counter or prescription medicines for pain, discomfort, or fever as directed by your caregiver. Do not use aspirin immediately after the injury unless instructed by your physician. Aspirin can cause increased bleeding and bruising of the tissues.  If you were given crutches, continue to use them as instructed and do not resume weight bearing on the sore joint until instructed. Persistent pain and inability to use the sore joint as directed for more than 2 to 3 days are warning signs indicating that you should see a caregiver for a follow-up visit as soon as possible. Initially, a hairline fracture (break in bone) may not be evident on X-rays. Persistent pain and swelling indicate that further evaluation, non-weight bearing or use of the joint (use of crutches or slings as instructed), or further X-rays are indicated. X-rays may sometimes not show a small fracture until a week or 10 days later. Make a follow-up appointment with your own caregiver or one to whom we have referred you. A radiologist (specialist in reading X-rays) may read your X-rays. Make sure you know how you are to obtain your X-ray results. Do not assume everything is normal if you do not hear from [place]. SEEK MEDICAL CARE IF: Bruising, swelling, or pain increases. SEEK IMMEDIATE MEDICAL CARE IF:   Your fingers or toes are numb or blue.  The pain is not responding to medications and continues to stay the same or get worse.  The pain in your joint becomes severe.  You develop a fever over 102 F (  38.9 C).  It becomes impossible to move or use the joint. MAKE SURE YOU:   Understand these instructions.  Will watch your condition.  Will get help right away if you are not doing well or get worse. Document Released: 06/15/2005 Document Revised: 09/07/2011 Document Reviewed: 02/01/2008 [facility] Patient Information 2014 [employer].  Gout Gout is an inflammatory arthritis caused by a  buildup of uric acid crystals in the joints. Uric acid is a chemical that is normally present in the blood. When the level of uric acid in the blood is too high it can form crystals that deposit in your joints and tissues. This causes joint redness, soreness, and swelling (inflammation). Repeat attacks are common. Over time, uric acid crystals can form into masses (tophi) near a joint, destroying bone and causing disfigurement. Gout is treatable and often preventable. CAUSES  The disease begins with elevated levels of uric acid in the blood. Uric acid is produced by your body when it breaks down a naturally found substance called purines. Certain foods you eat, such as meats and fish, contain high amounts of purines. Causes of an elevated uric acid level include:  Being passed down from parent to child (heredity).  Diseases that cause increased uric acid production (such as obesity, psoriasis, and certain cancers).  Excessive alcohol use.  Diet, especially diets rich in meat and seafood.  Medicines, including certain cancer-fighting medicines (chemotherapy), water pills (diuretics), and aspirin.  Chronic kidney disease. The kidneys are no longer able to remove uric acid well.  Problems with metabolism. Conditions strongly associated with gout include:  Obesity.  High blood pressure.  High cholesterol.  Diabetes. Not everyone with elevated uric acid levels gets gout. It is not understood why some people get gout and others do not. Surgery, joint injury, and eating too much of certain foods are some of the factors that can lead to gout attacks. SYMPTOMS   An attack of gout comes on quickly. It causes intense pain with redness, swelling, and warmth in a joint.  Fever can occur.  Often, only one joint is involved. Certain joints are more commonly involved:  Base of the big toe.  Knee.  Ankle.  Wrist.  Finger. Without treatment, an attack usually goes away in a few days to  weeks. Between attacks, you usually will not have symptoms, which is different from many other forms of arthritis. DIAGNOSIS  Your caregiver will suspect gout based on your symptoms and exam. In some cases, tests may be recommended. The tests may include:  Blood tests.  Urine tests.  X-rays.  Joint fluid exam. This exam requires a needle to remove fluid from the joint (arthrocentesis). Using a microscope, gout is confirmed when uric acid crystals are seen in the joint fluid. TREATMENT  There are two phases to gout treatment: treating the sudden onset (acute) attack and preventing attacks (prophylaxis).  Treatment of an Acute Attack.  Medicines are used. These include anti-inflammatory medicines or steroid medicines.  An injection of steroid medicine into the affected joint is sometimes necessary.  The painful joint is rested. Movement can worsen the arthritis.  You may use warm or cold treatments on painful joints, depending which works best for you.  Treatment to Prevent Attacks.  If you suffer from frequent gout attacks, your caregiver may advise preventive medicine. These medicines are started after the acute attack subsides. These medicines either help your kidneys eliminate uric acid from your body or decrease your uric acid production. You may need  to stay on these medicines for a very long time.  The early phase of treatment with preventive medicine can be associated with an increase in acute gout attacks. For this reason, during the first few months of treatment, your caregiver may also advise you to take medicines usually used for acute gout treatment. Be sure you understand your caregiver's directions. Your caregiver may make several adjustments to your medicine dose before these medicines are effective.  Discuss dietary treatment with your caregiver or dietitian. Alcohol and drinks high in sugar and fructose and foods such as meat, poultry, and seafood can increase uric acid  levels. Your caregiver or dietician can advise you on drinks and foods that should be limited. HOME CARE INSTRUCTIONS   Do not take aspirin to relieve pain. This raises uric acid levels.  Only take over-the-counter or prescription medicines for pain, discomfort, or fever as directed by your caregiver.  Rest the joint as much as possible. When in bed, keep sheets and blankets off painful areas.  Keep the affected joint raised (elevated).  Apply warm or cold treatments to painful joints. Use of warm or cold treatments depends on which works best for you.  Use crutches if the painful joint is in your leg.  Drink enough fluids to keep your urine clear or pale yellow. This helps your body get rid of uric acid. Limit alcohol, sugary drinks, and fructose drinks.  Follow your dietary instructions. Pay careful attention to the amount of protein you eat. Your daily diet should emphasize fruits, vegetables, whole grains, and fat-free or low-fat milk products. Discuss the use of coffee, vitamin C, and cherries with your caregiver or dietician. These may be helpful in lowering uric acid levels.  Maintain a healthy body weight. SEEK MEDICAL CARE IF:   You develop diarrhea, vomiting, or any side effects from medicines.  You do not feel better in 24 hours, or you are getting worse. SEEK IMMEDIATE MEDICAL CARE IF:   Your joint becomes suddenly more tender, and you have chills or a fever. MAKE SURE YOU:   Understand these instructions.  Will watch your condition.  Will get help right away if you are not doing well or get worse. Document Released: 06/12/2000 Document Revised: 10/10/2012 Document Reviewed: 01/27/2012 [facility] Patient Information 2014 [place].

## 2013-10-15 NOTE — ED Provider Notes (Addendum)
CSN: [id number]     Arrival date & time 10/15/13  16 History   First MD Initiated Contact with Patient 10/15/13 1454     Chief Complaint  Patient presents with  . Knee Pain      HPI  Patient has a long-standing history of arthritis. Followup with rheumatology. Has had an effusion her knee several times recently. Most recently about 4 weeks ago her rheumatologist performed an arthrocentesis. The patient does not know the results. She states that it was cloudy but does not know any testing that was done. She's had pain in her left toe the left knee for the last 6 weeks. Had a negative x-ray the knee several weeks ago as well. Has had recurrent episodes of pain the great toe.  Past Medical History  Diagnosis Date  . COPD (chronic obstructive pulmonary disease)   . Fibromyalgia   . Hypertension   . Insomnia   . Allergic rhinitis   . Neck pain   . Raynaud's disease   . Arthritis     rheumatoid   Past Surgical History  Procedure Laterality Date  . Abdominal hysterectomy    . Tubal ligation    . Hemorroidectomy    . Breast biopsy    . Lumbar laminectomy/decompression microdiscectomy  10/09/2011    Procedure: LUMBAR LAMINECTOMY/DECOMPRESSION MICRODISCECTOMY 1 LEVEL;  Surgeon: Erline Levine, MD;  Location: [facility];  Service: Neurosurgery;  Laterality: Right;  RIGHT Lumbar four-five foraminotomy with possible microdiskectomy   Family History  Problem Relation Age of Onset  . Rheum arthritis Mother   . Breast cancer Mother   . Breast cancer Sister   . Ovarian cancer Maternal Grandmother   . Rectal cancer Paternal Grandmother    History  Substance Use Topics  . Smoking status: Current Every Day Smoker -- 1.00 packs/day for 25 years    Types: Cigarettes  . Smokeless tobacco: Never Used  . Alcohol Use: No     Comment: Quit in 2001   OB History   Grav Para Term Preterm Abortions TAB SAB Ect Mult Living                 Review of Systems  Constitutional: Negative for  fever, chills, diaphoresis, appetite change and fatigue.  HENT: Negative for mouth sores, sore throat and trouble swallowing.   Eyes: Negative for visual disturbance.  Respiratory: Negative for cough, chest tightness, shortness of breath and wheezing.   Cardiovascular: Negative for chest pain.  Gastrointestinal: Negative for nausea, vomiting, abdominal pain, diarrhea and abdominal distention.  Endocrine: Negative for polydipsia, polyphagia and polyuria.  Genitourinary: Negative for dysuria, frequency and hematuria.  Musculoskeletal: Positive for arthralgias, back pain and joint swelling. Negative for gait problem.  Skin: Negative for color change, pallor and rash.  Neurological: Negative for dizziness, syncope, light-headedness and headaches.  Hematological: Does not bruise/bleed easily.  Psychiatric/Behavioral: Negative for behavioral problems and confusion.      Allergies  Celebrex; Doxycycline; Fentanyl; Lisinopril; Lyrica; Naproxen; Nsaids; Prednisone; Sumatriptan; Estradiol; Fish allergy; Methotrexate derivatives; Latex; Penicillins; and Sulfa antibiotics  Home Medications   Prior to Admission medications   Medication Sig Start Date End Date Taking? Authorizing Provider  adalimumab (HUMIRA PEN) 40 MG/0.8ML injection Inject 40 mg into the skin every 14 (fourteen) days.   Yes Historical Provider, MD  albuterol (PROVENTIL HFA;VENTOLIN HFA) 108 (90 BASE) MCG/ACT inhaler Inhale 2 puffs into the lungs 3 (three) times daily as needed. For shortness of breath    Yes Historical Provider,  MD  albuterol (PROVENTIL) (2.5 MG/3ML) 0.083% nebulizer solution Take 2.5 mg by nebulization every 6 (six) hours as needed for wheezing or shortness of breath.   Yes Historical Provider, MD  amitriptyline (ELAVIL) 150 MG tablet Take 150 mg by mouth at bedtime.     Yes Historical Provider, MD  bisoprolol (ZEBETA) 5 MG tablet Take 5 mg by mouth daily.   Yes Tanda Rockers, MD  cetirizine (ZYRTEC) 10 MG tablet  Take 10 mg by mouth daily.   Yes Historical Provider, MD  diazepam (VALIUM) 5 MG tablet Take 5 mg by mouth daily as needed for anxiety or sleep.   Yes Historical Provider, MD  Emollient (CERAVE) CREA Apply 1 application topically 2 (two) times daily as needed (break outs).    Yes Historical Provider, MD  esomeprazole (NEXIUM) 40 MG capsule Take 40 mg by mouth 2 (two) times daily.     Yes Historical Provider, MD  famotidine (PEPCID) 20 MG tablet Take 20 mg by mouth at bedtime. With nexium   Yes Historical Provider, MD  montelukast (SINGULAIR) 10 MG tablet Take 10 mg by mouth at bedtime.     Yes Historical Provider, MD  Olopatadine HCl (PATADAY) 0.2 % SOLN Place 1 drop into both eyes 2 (two) times daily.    Yes Historical Provider, MD  oxyCODONE-acetaminophen (PERCOCET) 10-325 MG per tablet Take 0.5 tablets by mouth every 6 (six) hours as needed. For pain 05/12/12  Yes [name], MD  polyethylene glycol [facility] / GLYCOLAX) packet Take 17 g by mouth daily as needed for mild constipation.   Yes Historical Provider, MD  tiZANidine (ZANAFLEX) 4 MG tablet Take 4 mg by mouth every 6 (six) hours as needed for muscle spasms.   Yes Historical Provider, MD  topiramate (TOPAMAX) 50 MG tablet Take 50 mg by mouth as needed (mirgaine).    Yes Historical Provider, MD  triamcinolone cream (KENALOG) 0.1 % Apply topically 3 (three) times daily. 08/22/12  Yes [name], MD  zolpidem (AMBIEN) 10 MG tablet Take 5-10 mg by mouth at bedtime as needed for sleep.    Yes Historical Provider, MD  methylPREDNISolone (MEDROL DOSEPAK) 4 MG tablet 6 po on day 1, then decrease by 1 per day 10/15/13   [name], MD  oxyCODONE-acetaminophen (PERCOCET/ROXICET) 5-325 MG per tablet Take 2 tablets by mouth every 4 (four) hours as needed. 10/15/13   [name], MD   BP 157/85  Pulse 77  Temp(Src) 98.9 F (37.2 C) (Oral)  Resp 18  SpO2 100% Physical Exam  Constitutional: She is oriented to person, place, and time. She  appears well-developed and well-nourished. No distress.  HENT:  Head: Normocephalic.  Eyes: Conjunctivae are normal. Pupils are equal, round, and reactive to light. No scleral icterus.  Neck: Normal range of motion. Neck supple. No thyromegaly present.  Cardiovascular: Normal rate and regular rhythm.  Exam reveals no gallop and no friction rub.   No murmur heard. Pulmonary/Chest: Effort normal and breath sounds normal. No respiratory distress. She has no wheezes. She has no rales.  Abdominal: Soft. Bowel sounds are normal. She exhibits no distension. There is no tenderness. There is no rebound.  Musculoskeletal: Normal range of motion.  Painful the MCP joint of the left great toe, and pain with range of motion of the knee. No palpable effusion in the knee. Erythema and soft tissue swelling around the toe. No asymmetry of the soft tissues of the leg above or below the knee. No palpable  cording. No radicular pattern for pain. Localized to the joints as above.  Neurological: She is alert and oriented to person, place, and time.  Skin: Skin is warm and dry. No rash noted.  Psychiatric: She has a normal mood and affect. Her behavior is normal.    ED Course  Procedures (including critical care time) Labs Review Labs Reviewed - No data to display  Imaging Review No results found.   EKG Interpretation None      MDM   Final diagnoses:  Arthralgia  Gout    Left great toe pain at the MCP, and left knee pain. No effusion in the knee on exam. I dont think that there is  Fluid to obtain for evaluation to confirm the diagnosis of gout. She will simple be treated for possible gout.   She has colchicine at home. Asked her to take this. Prescription for Medrol, and limited number of Percocet. Asked her to call her rheumatologist for appointment.    [name], MD 10/15/13 1538  [name], MD 10/15/13 [phone]

## 2013-10-15 NOTE — ED Notes (Signed)
Bed: WA08 Expected date: 10/15/13 Expected time: 1:50 PM Means of arrival:  Comments: Knee pain

## 2013-11-10 ENCOUNTER — Emergency Department (HOSPITAL_COMMUNITY)
Admission: EM | Admit: 2013-11-10 | Discharge: 2013-11-10 | Disposition: A | Discharge status: Home / Self Care | Payer: Medicaid Other | Attending: Emergency Medicine | Admitting: Emergency Medicine

## 2013-11-10 ENCOUNTER — Encounter (HOSPITAL_COMMUNITY): Payer: Self-pay | Admitting: Emergency Medicine

## 2013-11-10 DIAGNOSIS — M069 Rheumatoid arthritis, unspecified: Secondary | ICD-10-CM | POA: Insufficient documentation

## 2013-11-10 DIAGNOSIS — R269 Unspecified abnormalities of gait and mobility: Secondary | ICD-10-CM | POA: Insufficient documentation

## 2013-11-10 DIAGNOSIS — F17200 Nicotine dependence, unspecified, uncomplicated: Secondary | ICD-10-CM | POA: Insufficient documentation

## 2013-11-10 DIAGNOSIS — J4489 Other specified chronic obstructive pulmonary disease: Secondary | ICD-10-CM | POA: Insufficient documentation

## 2013-11-10 DIAGNOSIS — M79609 Pain in unspecified limb: Secondary | ICD-10-CM | POA: Insufficient documentation

## 2013-11-10 DIAGNOSIS — I10 Essential (primary) hypertension: Secondary | ICD-10-CM | POA: Insufficient documentation

## 2013-11-10 DIAGNOSIS — G4700 Insomnia, unspecified: Secondary | ICD-10-CM | POA: Insufficient documentation

## 2013-11-10 DIAGNOSIS — IMO0001 Reserved for inherently not codable concepts without codable children: Secondary | ICD-10-CM | POA: Insufficient documentation

## 2013-11-10 DIAGNOSIS — M79606 Pain in leg, unspecified: Secondary | ICD-10-CM

## 2013-11-10 DIAGNOSIS — M25569 Pain in unspecified knee: Secondary | ICD-10-CM | POA: Insufficient documentation

## 2013-11-10 DIAGNOSIS — Z91040 Latex allergy status: Secondary | ICD-10-CM | POA: Insufficient documentation

## 2013-11-10 DIAGNOSIS — Z79899 Other long term (current) drug therapy: Secondary | ICD-10-CM | POA: Insufficient documentation

## 2013-11-10 DIAGNOSIS — I7300 Raynaud's syndrome without gangrene: Secondary | ICD-10-CM | POA: Insufficient documentation

## 2013-11-10 DIAGNOSIS — J449 Chronic obstructive pulmonary disease, unspecified: Secondary | ICD-10-CM | POA: Insufficient documentation

## 2013-11-10 DIAGNOSIS — Z880 Allergy status to penicillin: Secondary | ICD-10-CM | POA: Insufficient documentation

## 2013-11-10 NOTE — Discharge Instructions (Signed)
Call for a follow up appointment with a Family or Primary Care Provider.  Keep your appointment with Dr. Jac Canavan.  Return if Symptoms worsen.   Take medication as prescribed.

## 2013-11-10 NOTE — ED Notes (Addendum)
Pt reports that she has been having left leg pain for the past 2 months. States that she has been seen here and at [facility], also followed up with a specialist. States that the pain is still in the top of her leg and down into her foot. States that her toes feel cold and numb. Sensation intact. States that she thinks she needs an MRI

## 2013-11-10 NOTE — ED Provider Notes (Signed)
CSN: [id number]     Arrival date & time 11/10/13  1448 History   First MD Initiated Contact with Patient 11/10/13 1613     Chief Complaint  Patient presents with  . Leg Pain  . Knee Pain     (Consider location/radiation/quality/duration/timing/severity/associated sxs/prior Treatment) HPI Comments: Patient is a 63-year-old female with past medical history of fibromyalgia, arthritis presents emergency room with a chief complaint of persistent lower extremity pain for several months, denies recent injury or fall. The patient reports being evaluated in emergency apartment in April.  She reports following up with Dr. Zigmund Daniel but did not like her care due to the provider "not knowing what was going on with her legs". She reports she schedule an outpatient MRI which she has not followed up on or had. She reports she made an appointment with Dr. Jac Canavan with [facility] in [place] for Monday but does not have a ride, [employer] appointment. She reports she is unable to see her PCP Dr. Anastasia Pall due to lack of transportation.  She reports her son from [place] is going to drive her to Dr. Armanda Magic office later in the week and is requesting x-ray files. She reports she is followed by pain clinic he was given a stat appointment, and "that's how it goes".  Patient is a 63 y.o. female presenting with leg pain and knee pain. The history is provided by the patient. No language interpreter was used.  Leg Pain Associated symptoms: no fever   Knee Pain Associated symptoms: no fever     Past Medical History  Diagnosis Date  . COPD (chronic obstructive pulmonary disease)   . Fibromyalgia   . Hypertension   . Insomnia   . Allergic rhinitis   . Neck pain   . Raynaud's disease   . Arthritis     rheumatoid   Past Surgical History  Procedure Laterality Date  . Abdominal hysterectomy    . Tubal ligation    . Hemorroidectomy    . Breast biopsy    . Lumbar laminectomy/decompression  microdiscectomy  10/09/2011    Procedure: LUMBAR LAMINECTOMY/DECOMPRESSION MICRODISCECTOMY 1 LEVEL;  Surgeon: Erline Levine, MD;  Location: [facility];  Service: Neurosurgery;  Laterality: Right;  RIGHT Lumbar four-five foraminotomy with possible microdiskectomy   Family History  Problem Relation Age of Onset  . Rheum arthritis Mother   . Breast cancer Mother   . Breast cancer Sister   . Ovarian cancer Maternal Grandmother   . Rectal cancer Paternal Grandmother    History  Substance Use Topics  . Smoking status: Current Every Day Smoker -- 1.00 packs/day for 25 years    Types: Cigarettes  . Smokeless tobacco: Never Used  . Alcohol Use: No     Comment: Quit in 2001   OB History   Grav Para Term Preterm Abortions TAB SAB Ect Mult Living                 Review of Systems  Constitutional: Negative for fever and chills.  Musculoskeletal: Positive for arthralgias and gait problem.  Skin: Negative for color change.  Neurological: Negative for weakness and numbness.      Allergies  Celebrex; Doxycycline; Fentanyl; Lisinopril; Lyrica; Naproxen; Nsaids; Prednisone; Sumatriptan; Estradiol; Fish allergy; Methotrexate derivatives; Latex; Penicillins; and Sulfa antibiotics  Home Medications   Prior to Admission medications   Medication Sig Start Date End Date Taking? Authorizing Provider  adalimumab (HUMIRA PEN) 40 MG/0.8ML injection Inject 40 mg into the skin every  14 (fourteen) days.   Yes Historical Provider, MD  albuterol (PROVENTIL HFA;VENTOLIN HFA) 108 (90 BASE) MCG/ACT inhaler Inhale 2 puffs into the lungs 3 (three) times daily as needed. For shortness of breath    Yes Historical Provider, MD  albuterol (PROVENTIL) (2.5 MG/3ML) 0.083% nebulizer solution Take 2.5 mg by nebulization every 6 (six) hours as needed for wheezing or shortness of breath.   Yes Historical Provider, MD  amitriptyline (ELAVIL) 150 MG tablet Take 150 mg by mouth at bedtime.     Yes Historical Provider, MD   bisoprolol (ZEBETA) 5 MG tablet Take 5 mg by mouth daily.   Yes Tanda Rockers, MD  cetirizine (ZYRTEC) 10 MG tablet Take 10 mg by mouth daily.   Yes Historical Provider, MD  diazepam (VALIUM) 5 MG tablet Take 5 mg by mouth daily as needed for anxiety or sleep.   Yes Historical Provider, MD  Emollient (CERAVE) CREA Apply 1 application topically 2 (two) times daily as needed (break outs).    Yes Historical Provider, MD  esomeprazole (NEXIUM) 40 MG capsule Take 40 mg by mouth 2 (two) times daily.     Yes Historical Provider, MD  famotidine (PEPCID) 20 MG tablet Take 20 mg by mouth at bedtime. With nexium   Yes Historical Provider, MD  montelukast (SINGULAIR) 10 MG tablet Take 10 mg by mouth at bedtime.     Yes Historical Provider, MD  Olopatadine HCl (PATADAY) 0.2 % SOLN Place 1 drop into both eyes 2 (two) times daily.    Yes Historical Provider, MD  oxyCODONE-acetaminophen (PERCOCET) 10-325 MG per tablet Take 0.5 tablets by mouth every 6 (six) hours as needed. For pain 05/12/12  Yes [name], MD  oxyCODONE-acetaminophen (PERCOCET/ROXICET) 5-325 MG per tablet Take 2 tablets by mouth every 4 (four) hours as needed. 10/15/13  Yes [name], MD  polyethylene glycol [facility] / GLYCOLAX) packet Take 17 g by mouth daily as needed for mild constipation.   Yes Historical Provider, MD  tiZANidine (ZANAFLEX) 4 MG tablet Take 4 mg by mouth every 6 (six) hours as needed for muscle spasms.   Yes Historical Provider, MD  topiramate (TOPAMAX) 50 MG tablet Take 50 mg by mouth as needed (mirgaine).    Yes Historical Provider, MD  triamcinolone cream (KENALOG) 0.1 % Apply topically 3 (three) times daily. 08/22/12  Yes [name], MD  zolpidem (AMBIEN) 10 MG tablet Take 5-10 mg by mouth at bedtime as needed for sleep.    Yes Historical Provider, MD   BP 145/69  Pulse 65  Temp(Src) 98.2 F (36.8 C)  Resp 18  Ht 5' 6.5" (1.689 m)  Wt 189 lb (85.73 kg)  BMI 30.05 kg/m2  SpO2 99% Physical Exam  Nursing  note and vitals reviewed. Constitutional: She is oriented to person, place, and time. She appears well-developed and well-nourished.  Non-toxic appearance. She does not have a sickly appearance. She does not appear ill. No distress.  HENT:  Head: Normocephalic and atraumatic.  Eyes: EOM are normal. Pupils are equal, round, and reactive to light.  Neck: Normal range of motion. Neck supple.  Cardiovascular: Normal rate, regular rhythm and intact distal pulses.   Pulses:      Dorsalis pedis pulses are 2+ on the right side, and 2+ on the left side.  No lower extremity edema, no calf tenderness to palpation.   Pulmonary/Chest: Effort normal. No respiratory distress.  Musculoskeletal:       Left knee: She exhibits swelling. She  exhibits normal range of motion, no ecchymosis, no deformity, no laceration and no erythema. Tenderness found.  Mild edema to the left knee. No overlying erythema, no increased temperature to touch.  Neurological: She is alert and oriented to person, place, and time.  Skin: Skin is warm and dry. No bruising, no ecchymosis and no rash noted. She is not diaphoretic. No erythema.  Psychiatric: She has a normal mood and affect. Her behavior is normal.    ED Course  Procedures (including critical care time)   MDM   Final diagnoses:  Leg pain   Patient presents with persistent lower extremity pain, no recent injury. Will follow up with or so, however did not like the plan of outpatient MRI. Self referred to Dr. Jac Canavan in [place]. She is requesting a copy of her XR performed in April.  Physical exam without concerning findings, normal range of motion neurovascular intact. Patient is able to ambulate with the use of her crutches.  10/03/2013 CLINICAL DATA: Chronic knee pain EXAM: LEFT KNEE - COMPLETE 4+ VIEW COMPARISON: None. FINDINGS: Small joint effusion. Mild patellar spurring. Joint spaces are maintained. Negative for fracture. IMPRESSION: Knee joint effusion. Negative  for fracture. Mild patellar spurring. Electronically Signed By: Franchot Gallo M.D. On: 10/03/2013 11:51     Lorrine Kin, PA-C 11/11/13 2020

## 2013-11-12 NOTE — ED Provider Notes (Signed)
History/physical exam/procedure(s) were performed by non-physician practitioner and as supervising physician I was immediately available for consultation/collaboration. I have reviewed all notes and am in agreement with care and plan.   Shaune Pollack, MD 11/12/13 [phone]

## 2014-04-16 ENCOUNTER — Emergency Department (HOSPITAL_COMMUNITY): Payer: Medicaid Other

## 2014-04-16 ENCOUNTER — Encounter (HOSPITAL_COMMUNITY): Payer: Self-pay | Admitting: Emergency Medicine

## 2014-04-16 ENCOUNTER — Emergency Department (HOSPITAL_COMMUNITY)
Admission: EM | Admit: 2014-04-16 | Discharge: 2014-04-16 | Disposition: A | Discharge status: Home / Self Care | Payer: Medicaid Other | Attending: Emergency Medicine | Admitting: Emergency Medicine

## 2014-04-16 DIAGNOSIS — J449 Chronic obstructive pulmonary disease, unspecified: Secondary | ICD-10-CM | POA: Diagnosis not present

## 2014-04-16 DIAGNOSIS — M797 Fibromyalgia: Secondary | ICD-10-CM | POA: Insufficient documentation

## 2014-04-16 DIAGNOSIS — Z91040 Latex allergy status: Secondary | ICD-10-CM | POA: Diagnosis not present

## 2014-04-16 DIAGNOSIS — Y9289 Other specified places as the place of occurrence of the external cause: Secondary | ICD-10-CM | POA: Insufficient documentation

## 2014-04-16 DIAGNOSIS — I10 Essential (primary) hypertension: Secondary | ICD-10-CM | POA: Diagnosis not present

## 2014-04-16 DIAGNOSIS — W1830XA Fall on same level, unspecified, initial encounter: Secondary | ICD-10-CM | POA: Diagnosis not present

## 2014-04-16 DIAGNOSIS — Z720 Tobacco use: Secondary | ICD-10-CM | POA: Diagnosis not present

## 2014-04-16 DIAGNOSIS — Z880 Allergy status to penicillin: Secondary | ICD-10-CM | POA: Insufficient documentation

## 2014-04-16 DIAGNOSIS — S4992XA Unspecified injury of left shoulder and upper arm, initial encounter: Secondary | ICD-10-CM | POA: Diagnosis not present

## 2014-04-16 DIAGNOSIS — M069 Rheumatoid arthritis, unspecified: Secondary | ICD-10-CM | POA: Insufficient documentation

## 2014-04-16 DIAGNOSIS — Y9389 Activity, other specified: Secondary | ICD-10-CM | POA: Insufficient documentation

## 2014-04-16 DIAGNOSIS — G4700 Insomnia, unspecified: Secondary | ICD-10-CM | POA: Insufficient documentation

## 2014-04-16 DIAGNOSIS — Z79899 Other long term (current) drug therapy: Secondary | ICD-10-CM | POA: Insufficient documentation

## 2014-04-16 DIAGNOSIS — I7300 Raynaud's syndrome without gangrene: Secondary | ICD-10-CM | POA: Insufficient documentation

## 2014-04-16 DIAGNOSIS — S29002A Unspecified injury of muscle and tendon of back wall of thorax, initial encounter: Secondary | ICD-10-CM | POA: Diagnosis present

## 2014-04-16 LAB — BASIC METABOLIC PANEL
Anion gap: 10 (ref 5–15)
BUN: 7 mg/dL (ref 6–23)
CO2: 28 mEq/L (ref 19–32)
Calcium: 9.3 mg/dL (ref 8.4–10.5)
Chloride: 106 mEq/L (ref 96–112)
Creatinine, Ser: 0.78 mg/dL (ref 0.50–1.10)
GFR calc Af Amer: >90 mL/min (ref >90)
GFR calc non Af Amer: 87 mL/min — ABNORMAL LOW (ref >90)
Glucose, Bld: 95 mg/dL (ref 70–99)
Potassium: 4 mEq/L (ref 3.7–5.3)
Sodium: 144 mEq/L (ref 137–147)

## 2014-04-16 LAB — CBC
HCT: 37.7 % (ref 36.0–46.0)
Hemoglobin: 12.7 g/dL (ref 12.0–15.0)
MCH: 27.3 pg (ref 26.0–34.0)
MCHC: 33.7 g/dL (ref 30.0–36.0)
MCV: 80.9 fL (ref 78.0–100.0)
Platelets: 146 10*3/uL — ABNORMAL LOW (ref 150–400)
RBC: 4.66 MIL/uL (ref 3.87–5.11)
RDW: 13.8 % (ref 11.5–15.5)
WBC: 6.3 10*3/uL (ref 4.0–10.5)

## 2014-04-16 MED ORDER — OXYCODONE-ACETAMINOPHEN 5-325 MG PO TABS: 1.0000 | ORAL_TABLET | Freq: Four times a day (QID) | ORAL | Status: DC | PRN

## 2014-04-16 MED ORDER — MORPHINE SULFATE 4 MG/ML IJ SOLN
6.0000 mg | Freq: Once | INTRAMUSCULAR | Status: AC
  Administered 2014-04-16: 6 mg via INTRAMUSCULAR
  Filled 2014-04-16: qty 2

## 2014-04-16 NOTE — ED Notes (Addendum)
Pt in c/o left arm pain after a fall on 10/7, also reports upper back pain, between her shoulder blades that started after her endoscopy on 9/28. Pt states pain is worse with movement or deep inhalation. States she went back to PCP and no testing was completed. No distress noted.

## 2014-04-16 NOTE — Discharge Instructions (Signed)
Followup with the orthopedist provided.  Use ice and heat on your shoulder and hip.  Return here as needed

## 2014-04-16 NOTE — ED Notes (Signed)
Patient transported to X-ray 

## 2014-04-16 NOTE — ED Notes (Signed)
Arm sling applied and pt verbalizes understanding of proper use

## 2014-04-16 NOTE — ED Provider Notes (Signed)
CSN: [id number]     Arrival date & time 04/16/14  1541 History   First MD Initiated Contact with Patient 04/16/14 1924     Chief Complaint  Patient presents with  . Fall  . Back Pain     (Consider location/radiation/quality/duration/timing/severity/associated sxs/prior Treatment) HPI Patient presents to the emergency department with left shoulder pain and chronic back pain.  The patient, states she fell, injuring her left shoulder 2 weeks ago.  The patient, states, that she saw her primary care Dr. he did not do any testing for her.  The patient, states, that she has only taken her home medications without relief of her symptoms.  The patient, states she does not have any chest pain, shortness of breath, weakness, dizziness, headache, blurred vision, fever, rash, lightheadedness back pain, neck pain, or syncope.  He should states, that she took medications at home without relief of her pain.  The patient has chronic pain.  Patient, states movement and palpation make the pain, worse Past Medical History  Diagnosis Date  . COPD (chronic obstructive pulmonary disease)   . Fibromyalgia   . Hypertension   . Insomnia   . Allergic rhinitis   . Neck pain   . Raynaud's disease   . Arthritis     rheumatoid   Past Surgical History  Procedure Laterality Date  . Abdominal hysterectomy    . Tubal ligation    . Hemorroidectomy    . Breast biopsy    . Lumbar laminectomy/decompression microdiscectomy  10/09/2011    Procedure: LUMBAR LAMINECTOMY/DECOMPRESSION MICRODISCECTOMY 1 LEVEL;  Surgeon: Erline Levine, MD;  Location: [facility];  Service: Neurosurgery;  Laterality: Right;  RIGHT Lumbar four-five foraminotomy with possible microdiskectomy   Family History  Problem Relation Age of Onset  . Rheum arthritis Mother   . Breast cancer Mother   . Breast cancer Sister   . Ovarian cancer Maternal Grandmother   . Rectal cancer Paternal Grandmother    History  Substance Use Topics  . Smoking  status: Current Every Day Smoker -- 1.00 packs/day for 25 years    Types: Cigarettes  . Smokeless tobacco: Never Used  . Alcohol Use: No     Comment: Quit in 2001   OB History   Grav Para Term Preterm Abortions TAB SAB Ect Mult Living                 Review of Systems  All other systems negative except as documented in the HPI. All pertinent positives and negatives as reviewed in the HPI.  Allergies  Celebrex; Doxycycline; Fentanyl; Lisinopril; Lyrica; Naproxen; Nsaids; Prednisone; Sumatriptan; Estradiol; Fish allergy; Methotrexate derivatives; Latex; Penicillins; and Sulfa antibiotics  Home Medications   Prior to Admission medications   Medication Sig Start Date End Date Taking? Authorizing Provider  adalimumab (HUMIRA PEN) 40 MG/0.8ML injection Inject 40 mg into the skin every 14 (fourteen) days.    Historical Provider, MD  albuterol (PROVENTIL HFA;VENTOLIN HFA) 108 (90 BASE) MCG/ACT inhaler Inhale 2 puffs into the lungs 3 (three) times daily as needed. For shortness of breath     Historical Provider, MD  albuterol (PROVENTIL) (2.5 MG/3ML) 0.083% nebulizer solution Take 2.5 mg by nebulization every 6 (six) hours as needed for wheezing or shortness of breath.    Historical Provider, MD  amitriptyline (ELAVIL) 150 MG tablet Take 150 mg by mouth at bedtime.      Historical Provider, MD  bisoprolol (ZEBETA) 5 MG tablet Take 5 mg by mouth daily.  Tanda Rockers, MD  cetirizine (ZYRTEC) 10 MG tablet Take 10 mg by mouth daily.    Historical Provider, MD  diazepam (VALIUM) 5 MG tablet Take 5 mg by mouth daily as needed for anxiety or sleep.    Historical Provider, MD  Emollient (CERAVE) CREA Apply 1 application topically 2 (two) times daily as needed (break outs).     Historical Provider, MD  esomeprazole (NEXIUM) 40 MG capsule Take 40 mg by mouth 2 (two) times daily.      Historical Provider, MD  famotidine (PEPCID) 20 MG tablet Take 20 mg by mouth at bedtime. With nexium    Historical  Provider, MD  montelukast (SINGULAIR) 10 MG tablet Take 10 mg by mouth at bedtime.      Historical Provider, MD  Olopatadine HCl (PATADAY) 0.2 % SOLN Place 1 drop into both eyes 2 (two) times daily.     Historical Provider, MD  oxyCODONE-acetaminophen (PERCOCET) 10-325 MG per tablet Take 0.5 tablets by mouth every 6 (six) hours as needed. For pain 05/12/12   [name], MD  oxyCODONE-acetaminophen (PERCOCET/ROXICET) 5-325 MG per tablet Take 2 tablets by mouth every 4 (four) hours as needed. 10/15/13   [name], MD  polyethylene glycol [facility]) packet Take 17 g by mouth daily as needed for mild constipation.    Historical Provider, MD  tiZANidine (ZANAFLEX) 4 MG tablet Take 4 mg by mouth every 6 (six) hours as needed for muscle spasms.    Historical Provider, MD  topiramate (TOPAMAX) 50 MG tablet Take 50 mg by mouth as needed (mirgaine).     Historical Provider, MD  triamcinolone cream (KENALOG) 0.1 % Apply topically 3 (three) times daily. 08/22/12   [name], MD  zolpidem (AMBIEN) 10 MG tablet Take 5-10 mg by mouth at bedtime as needed for sleep.     Historical Provider, MD   BP 148/78  Pulse 52  Temp(Src) 99.4 F (37.4 C) (Oral)  Resp 15  Ht 5\' 6"  (1.676 m)  Wt 168 lb (76.204 kg)  BMI 27.13 kg/m2  SpO2 100% Physical Exam  Nursing note and vitals reviewed. Constitutional: She is oriented to person, place, and time. She appears well-developed and well-nourished. No distress.  HENT:  Head: Normocephalic and atraumatic.  Mouth/Throat: Oropharynx is clear and moist.  Eyes: Pupils are equal, round, and reactive to light.  Neck: Normal range of motion. Neck supple.  Cardiovascular: Normal rate, regular rhythm and normal heart sounds.  Exam reveals no gallop and no friction rub.   No murmur heard. Pulmonary/Chest: Effort normal and breath sounds normal. No respiratory distress.  Neurological: She is alert and oriented to person, place, and time. She exhibits normal  muscle tone. Coordination normal.  Skin: Skin is warm and dry.    ED Course  Procedures (including critical care time) Labs Review Labs Reviewed  CBC - Abnormal; Notable for the following:    Platelets 146 (*)    All other components within normal limits  BASIC METABOLIC PANEL - Abnormal; Notable for the following:    GFR calc non Af Amer 87 (*)    All other components within normal limits  I-STAT TROPOININ, ED    Imaging Review Dg Chest 2 View  04/16/2014   CLINICAL DATA:  Pain after fall.  EXAM: CHEST  2 VIEW  COMPARISON:  August 07, 2013.  FINDINGS: The heart size and mediastinal contours are within normal limits. Both lungs are clear. No pneumothorax or pleural effusion is  noted. The visualized skeletal structures are unremarkable.  IMPRESSION: No acute cardiopulmonary abnormality seen.   Electronically Signed   By: Sabino Dick M.D.   On: 04/16/2014 18:10   Dg Shoulder Left  04/16/2014   CLINICAL DATA:  Fall.  EXAM: LEFT SHOULDER - 2+ VIEW  COMPARISON:  None.  FINDINGS: There is no evidence of fracture or dislocation. There is no evidence of arthropathy or other focal bone abnormality. Soft tissues are unremarkable.  IMPRESSION: Negative.   Electronically Signed   By: Misty Stanley M.D.   On: 04/16/2014 18:09    Patient be discharged home followup with her primary care Dr. told to return here as needed.  To use ice and heat on her shoulders  Brent General, PA-C 04/19/14 1558

## 2014-04-21 NOTE — ED Provider Notes (Signed)
Medical screening examination/treatment/procedure(s) were performed by non-physician practitioner and as supervising physician I was immediately available for consultation/collaboration.   EKG Interpretation   Date/Time:  Monday April 16 2014 16:46:32 EDT Ventricular Rate:  54 PR Interval:  168 QRS Duration: 78 QT Interval:  424 QTC Calculation: 402 R Axis:   47 Text Interpretation:  Sinus bradycardia Septal infarct , age undetermined  Abnormal ECG ED PHYSICIAN INTERPRETATION AVAILABLE IN [facility]  Confirmed by TEST, Record ([id number]) on 04/18/2014 7:51:53 AM       Jasper Riling. Alvino Chapel, MD 04/21/14 [phone]

## 2014-10-30 ENCOUNTER — Emergency Department (HOSPITAL_COMMUNITY)
Admission: EM | Admit: 2014-10-30 | Discharge: 2014-10-30 | Disposition: A | Discharge status: Home / Self Care | Payer: Medicaid Other

## 2014-10-30 NOTE — ED Notes (Signed)
No answer x2 

## 2014-10-31 ENCOUNTER — Emergency Department (HOSPITAL_COMMUNITY): Payer: Medicaid Other

## 2014-10-31 ENCOUNTER — Emergency Department (HOSPITAL_COMMUNITY)
Admission: EM | Admit: 2014-10-31 | Discharge: 2014-10-31 | Disposition: A | Discharge status: Home / Self Care | Payer: Medicaid Other | Attending: Emergency Medicine | Admitting: Emergency Medicine

## 2014-10-31 ENCOUNTER — Encounter (HOSPITAL_COMMUNITY): Payer: Self-pay | Admitting: *Deleted

## 2014-10-31 DIAGNOSIS — I10 Essential (primary) hypertension: Secondary | ICD-10-CM | POA: Insufficient documentation

## 2014-10-31 DIAGNOSIS — M79605 Pain in left leg: Secondary | ICD-10-CM | POA: Diagnosis not present

## 2014-10-31 DIAGNOSIS — J449 Chronic obstructive pulmonary disease, unspecified: Secondary | ICD-10-CM | POA: Diagnosis not present

## 2014-10-31 DIAGNOSIS — Z79899 Other long term (current) drug therapy: Secondary | ICD-10-CM | POA: Diagnosis not present

## 2014-10-31 DIAGNOSIS — Z880 Allergy status to penicillin: Secondary | ICD-10-CM | POA: Diagnosis not present

## 2014-10-31 DIAGNOSIS — Z720 Tobacco use: Secondary | ICD-10-CM | POA: Insufficient documentation

## 2014-10-31 DIAGNOSIS — M797 Fibromyalgia: Secondary | ICD-10-CM | POA: Insufficient documentation

## 2014-10-31 DIAGNOSIS — G8929 Other chronic pain: Secondary | ICD-10-CM | POA: Insufficient documentation

## 2014-10-31 DIAGNOSIS — M1990 Unspecified osteoarthritis, unspecified site: Secondary | ICD-10-CM | POA: Diagnosis not present

## 2014-10-31 DIAGNOSIS — H9201 Otalgia, right ear: Secondary | ICD-10-CM | POA: Diagnosis present

## 2014-10-31 DIAGNOSIS — R0789 Other chest pain: Secondary | ICD-10-CM | POA: Diagnosis not present

## 2014-10-31 DIAGNOSIS — M79604 Pain in right leg: Secondary | ICD-10-CM | POA: Diagnosis not present

## 2014-10-31 DIAGNOSIS — Z91040 Latex allergy status: Secondary | ICD-10-CM | POA: Diagnosis not present

## 2014-10-31 DIAGNOSIS — R079 Chest pain, unspecified: Secondary | ICD-10-CM

## 2014-10-31 DIAGNOSIS — I7300 Raynaud's syndrome without gangrene: Secondary | ICD-10-CM | POA: Diagnosis not present

## 2014-10-31 DIAGNOSIS — G4700 Insomnia, unspecified: Secondary | ICD-10-CM | POA: Diagnosis not present

## 2014-10-31 LAB — I-STAT TROPONIN, ED
Troponin i, poc: 0 ng/mL (ref 0.00–0.08)
Troponin i, poc: 0 ng/mL (ref 0.00–0.08)

## 2014-10-31 LAB — I-STAT CHEM 8, ED
BUN: 8 mg/dL (ref 6–20)
Calcium, Ion: 1.17 mmol/L (ref 1.13–1.30)
Chloride: 108 mmol/L (ref 101–111)
Creatinine, Ser: 0.8 mg/dL (ref 0.44–1.00)
Glucose, Bld: 83 mg/dL (ref 70–99)
HCT: 41 % (ref 36.0–46.0)
Hemoglobin: 13.9 g/dL (ref 12.0–15.0)
Potassium: 4.3 mmol/L (ref 3.5–5.1)
Sodium: 142 mmol/L (ref 135–145)
TCO2: 24 mmol/L (ref 0–100)

## 2014-10-31 MED ORDER — METOCLOPRAMIDE HCL 5 MG/ML IJ SOLN
10.0000 mg | Freq: Once | INTRAMUSCULAR | Status: AC
  Administered 2014-10-31: 10 mg via INTRAVENOUS
  Filled 2014-10-31: qty 2

## 2014-10-31 MED ORDER — OXYCODONE-ACETAMINOPHEN 5-325 MG PO TABS
1.0000 | ORAL_TABLET | Freq: Once | ORAL | Status: AC
  Administered 2014-10-31: 1 via ORAL
  Filled 2014-10-31: qty 1

## 2014-10-31 NOTE — ED Notes (Signed)
Pt reports ongoing Chest pain for the past few months. Dr. Winfred Leeds at bedside and aware. EKG printed.

## 2014-10-31 NOTE — ED Notes (Signed)
Pt states she does not want an IV and would rather have PO nausea medication.

## 2014-10-31 NOTE — Discharge Instructions (Signed)
Chest Pain (Nonspecific) Take your Percocet as prescribed for pain. Call Dr.Badger tomorrow to schedule a follow-up visit. Asked Dr. Melford Aase to help you to stop smoking It is often hard to give a diagnosis for the cause of chest pain. There is always a chance that your pain could be related to something serious, such as a heart attack or a blood clot in the lungs. You need to follow up with your doctor. HOME CARE  If antibiotic medicine was given, take it as directed by your doctor. Finish the medicine even if you start to feel better.  For the next few days, avoid activities that bring on chest pain. Continue physical activities as told by your doctor.  Do not use any tobacco products. This includes cigarettes, chewing tobacco, and e-cigarettes.  Avoid drinking alcohol.  Only take medicine as told by your doctor.  Follow your doctor's suggestions for more testing if your chest pain does not go away.  Keep all doctor visits you made. GET HELP IF:  Your chest pain does not go away, even after treatment.  You have a rash with blisters on your chest.  You have a fever. GET HELP RIGHT AWAY IF:   You have more pain or pain that spreads to your arm, neck, jaw, back, or belly (abdomen).  You have shortness of breath.  You cough more than usual or cough up blood.  You have very bad back or belly pain.  You feel sick to your stomach (nauseous) or throw up (vomit).  You have very bad weakness.  You pass out (faint).  You have chills. This is an emergency. Do not wait to see if the problems will go away. Call your local emergency services (911 in [place] Do not drive yourself to the hospital. MAKE SURE YOU:   Understand these instructions.  Will watch your condition.  Will get help right away if you are not doing well or get worse. Document Released: 12/02/2007 Document Revised: 06/20/2013 Document Reviewed: 12/02/2007 [facility] Patient Information 2015 [place], [place]. This  information is not intended to replace advice given to you by your health care provider. Make sure you discuss any questions you have with your health care provider. Migraine Headache A migraine headache is very bad, throbbing pain on one or both sides of your head. Talk to your doctor about what things may bring on (trigger) your migraine headaches. HOME CARE  Only take medicines as told by your doctor.  Lie down in a dark, quiet room when you have a migraine.  Keep a journal to find out if certain things bring on migraine headaches. For example, write down:  What you eat and drink.  How much sleep you get.  Any change to your diet or medicines.  Lessen how much alcohol you drink.  Quit smoking if you smoke.  Get enough sleep.  Lessen any stress in your life.  Keep lights dim if bright lights bother you or make your migraines worse. GET HELP RIGHT AWAY IF:   Your migraine becomes really bad.  You have a fever.  You have a stiff neck.  You have trouble seeing.  Your muscles are weak, or you lose muscle control.  You lose your balance or have trouble walking.  You feel like you will pass out (faint), or you pass out.  You have really bad symptoms that are different than your first symptoms. MAKE SURE YOU:   Understand these instructions.  Will watch your condition.  Will get  help right away if you are not doing well or get worse. Document Released: 03/24/2008 Document Revised: 09/07/2011 Document Reviewed: 02/20/2013 [facility] Patient Information 2015 [place], [place]. This information is not intended to replace advice given to you by your health care provider. Make sure you discuss any questions you have with your health care provider.

## 2014-10-31 NOTE — ED Provider Notes (Addendum)
CSN: [id number]     Arrival date & time 10/31/14  1735 History   First MD Initiated Contact with Patient 10/31/14 1811     Chief Complaint  Patient presents with  . Migraine  . Leg Pain  . Otalgia     (Consider location/radiation/quality/duration/timing/severity/associated sxs/prior Treatment) HPI Complete of migraine headache throbbing in nature diffuse onset gradually, several days ago. Typical migraine she gets 3 or 4 times per month. She also complains of right ear pain for several days, feels like fluid is in my ear. Also complains of bilateral leg cramps she points to the distal two thirds of her shins down to her feet stating "my toes caught up from under me." No leg cramps presently. She also complains of chest pain for the past 3 days intermittent lasting anywhere from 12:57 hour. Nonexertional. Sometimes worse with changing positions. No chest pain at present. No associated nausea shortness of breath or sweatiness Past Medical History  Diagnosis Date  . COPD (chronic obstructive pulmonary disease)   . Fibromyalgia   . Hypertension   . Insomnia   . Allergic rhinitis   . Neck pain   . Raynaud's disease   . Arthritis     rheumatoid   chronic pain. In pain clinic Past Surgical History  Procedure Laterality Date  . Abdominal hysterectomy    . Tubal ligation    . Hemorroidectomy    . Breast biopsy    . Lumbar laminectomy/decompression microdiscectomy  10/09/2011    Procedure: LUMBAR LAMINECTOMY/DECOMPRESSION MICRODISCECTOMY 1 LEVEL;  Surgeon: Erline Levine, MD;  Location: [facility];  Service: Neurosurgery;  Laterality: Right;  RIGHT Lumbar four-five foraminotomy with possible microdiskectomy   Family History  Problem Relation Age of Onset  . Rheum arthritis Mother   . Breast cancer Mother   . Breast cancer Sister   . Ovarian cancer Maternal Grandmother   . Rectal cancer Paternal Grandmother    History  Substance Use Topics  . Smoking status: Current Every Day Smoker  -- 1.00 packs/day for 25 years    Types: Cigarettes  . Smokeless tobacco: Never Used  . Alcohol Use: No     Comment: Quit in 2001   OB History    No data available     Review of Systems  Constitutional: Negative.   HENT: Positive for ear pain.   Respiratory: Negative.   Cardiovascular: Positive for chest pain.  Gastrointestinal: Negative.   Musculoskeletal: Positive for myalgias.       Leg cramps  Skin: Negative.   Neurological: Positive for headaches.  Psychiatric/Behavioral: Negative.   All other systems reviewed and are negative.     Allergies  Celebrex; Doxycycline; Fentanyl; Lisinopril; Lyrica; Naproxen; Nsaids; Prednisone; Sumatriptan; Estradiol; Fish allergy; Methotrexate derivatives; Latex; Penicillins; and Sulfa antibiotics  Home Medications   Prior to Admission medications   Medication Sig Start Date End Date Taking? Authorizing Provider  adalimumab (HUMIRA PEN) 40 MG/0.8ML injection Inject 40 mg into the skin every 14 (fourteen) days.    Historical Provider, MD  albuterol (PROVENTIL HFA;VENTOLIN HFA) 108 (90 BASE) MCG/ACT inhaler Inhale 2 puffs into the lungs 3 (three) times daily as needed. For shortness of breath     Historical Provider, MD  albuterol (PROVENTIL) (2.5 MG/3ML) 0.083% nebulizer solution Take 2.5 mg by nebulization every 6 (six) hours as needed for wheezing or shortness of breath.    Historical Provider, MD  amitriptyline (ELAVIL) 150 MG tablet Take 150 mg by mouth at bedtime.  Historical Provider, MD  bisoprolol (ZEBETA) 5 MG tablet Take 5 mg by mouth daily.    Tanda Rockers, MD  cetirizine (ZYRTEC) 10 MG tablet Take 10 mg by mouth daily.    Historical Provider, MD  diazepam (VALIUM) 5 MG tablet Take 5 mg by mouth daily as needed for anxiety or sleep.    Historical Provider, MD  Emollient (CERAVE) CREA Apply 1 application topically 2 (two) times daily as needed (break outs).     Historical Provider, MD  esomeprazole (NEXIUM) 40 MG capsule Take  40 mg by mouth 2 (two) times daily.      Historical Provider, MD  famotidine (PEPCID) 20 MG tablet Take 20 mg by mouth at bedtime. With nexium    Historical Provider, MD  montelukast (SINGULAIR) 10 MG tablet Take 10 mg by mouth at bedtime.      Historical Provider, MD  Olopatadine HCl (PATADAY) 0.2 % SOLN Place 1 drop into both eyes 2 (two) times daily.     Historical Provider, MD  oxyCODONE-acetaminophen (PERCOCET) 10-325 MG per tablet Take 0.5 tablets by mouth every 6 (six) hours as needed. For pain 05/12/12   [name], MD  oxyCODONE-acetaminophen (PERCOCET/ROXICET) 5-325 MG per tablet Take 2 tablets by mouth every 4 (four) hours as needed. 10/15/13   [name], MD  oxyCODONE-acetaminophen (PERCOCET/ROXICET) 5-325 MG per tablet Take 1 tablet by mouth every 6 (six) hours as needed for severe pain. 04/16/14   Christopher Lawyer, PA-C  polyethylene glycol (MIRALAX / GLYCOLAX) packet Take 17 g by mouth daily as needed for mild constipation.    Historical Provider, MD  tiZANidine (ZANAFLEX) 4 MG tablet Take 4 mg by mouth every 6 (six) hours as needed for muscle spasms.    Historical Provider, MD  topiramate (TOPAMAX) 50 MG tablet Take 50 mg by mouth as needed (mirgaine).     Historical Provider, MD  triamcinolone cream (KENALOG) 0.1 % Apply topically 3 (three) times daily. 08/22/12   [name], MD  zolpidem (AMBIEN) 10 MG tablet Take 5-10 mg by mouth at bedtime as needed for sleep.     Historical Provider, MD   BP 148/73 mmHg  Pulse 57  Temp(Src) 98.3 F (36.8 C) (Oral)  Resp 18  Ht 5\' 7"  (1.702 m)  Wt 174 lb (78.926 kg)  BMI 27.25 kg/m2  SpO2 98% Physical Exam  Constitutional: She is oriented to person, place, and time. She appears well-developed and well-nourished.  HENT:  Head: Normocephalic and atraumatic.  Right Ear: External ear normal.  Left Ear: External ear normal.  Tympanic membranes normal bilaterally  Eyes: Conjunctivae are normal. Pupils are equal, round, and  reactive to light.  Neck: Neck supple. No tracheal deviation present. No thyromegaly present.  Cardiovascular: Normal rate and regular rhythm.   No murmur heard. Pulmonary/Chest: Effort normal and breath sounds normal.  Abdominal: Soft. Bowel sounds are normal. She exhibits no distension. There is no tenderness.  Musculoskeletal: Normal range of motion. She exhibits no edema or tenderness.  Lymphadenopathy:    She has no cervical adenopathy.  Neurological: She is alert and oriented to person, place, and time. She has normal reflexes. No cranial nerve deficit. Coordination normal.  Gait normal Romberg normal pronator drift normal  Skin: Skin is warm and dry. No rash noted.  Psychiatric: She has a normal mood and affect.  Nursing note and vitals reviewed.   ED Course  Procedures (including critical care time) Labs Review Labs Reviewed - No data to  display  Imaging Review No results found.   EKG Interpretation None     ED ECG REPORT   Date: 10/31/2014  Rate: 55  Rhythm: sinus bradycardia  QRS Axis: normal  Intervals: normal  ST/T Wave abnormalities: normal  Conduction Disutrbances:none  Narrative Interpretation:   Old EKG Reviewed: none available   Chest xray viewed by me Results for orders placed or performed during the hospital encounter of 10/31/14  I-stat troponin, ED  Result Value Ref Range   Troponin i, poc 0.00 0.00 - 0.08 ng/mL   Comment 3          I-stat troponin, ED  Result Value Ref Range   Troponin i, poc 0.00 0.00 - 0.08 ng/mL   Comment 3          I-stat chem 8, ed  Result Value Ref Range   Sodium 142 135 - 145 mmol/L   Potassium 4.3 3.5 - 5.1 mmol/L   Chloride 108 101 - 111 mmol/L   BUN 8 6 - 20 mg/dL   Creatinine, Ser 0.80 0.44 - 1.00 mg/dL   Glucose, Bld 83 70 - 99 mg/dL   Calcium, Ion 1.17 1.13 - 1.30 mmol/L   TCO2 24 0 - 100 mmol/L   Hemoglobin 13.9 12.0 - 15.0 g/dL   HCT 41.0 36.0 - 46.0 %   Dg Chest 2 View  10/31/2014   CLINICAL DATA:   Left side chest pain.  Shortness of breath today.  EXAM: CHEST  2 VIEW  COMPARISON:  PA and lateral chest 04/16/2014.  FINDINGS: Heart size and mediastinal contours are within normal limits. Both lungs are clear. Visualized skeletal structures are unremarkable.  IMPRESSION: No acute disease.   Electronically Signed   By: Inge Rise M.D.   On: 10/31/2014 19:45     I have personally reviewed the EKG tracing and agree with the computerized printout as noted. 10:20 PM patient complains of diffuse headache which has not improved after treatment with intravenous Reglan. No further episodes of chest pain or leg pain or ear pain. She is alert Glasgow Coma Score 15 appears in no distress. Percocet ordered.  11 PM patient feels improved and ready to go home. MDM  Cardiac risk factors hypercholesterolemia smoker hypertension Heart score equals 3 patient on risk factors, age story is highly atypical. councilled pt for 5 minutes on smoking cessatyion Final diagnoses:  None   Plan patient has Percocet at home which she can take which is prescribed by her pain management physician. She can follow up with Dr. Melford Aase, primary care physician regarding chest pain Diagnosis #1 migraine headache #2 atypical chest pain #3 myalgias #4 tobacco abuse #4 right ear pain     Orlie Dakin, MD 10/31/14 2924  Orlie Dakin, MD 10/31/14 2307

## 2014-10-31 NOTE — ED Notes (Signed)
Pt reports migraine, ear pain, and bilateral leg cramps since last night.

## 2015-01-18 ENCOUNTER — Other Ambulatory Visit: Payer: Self-pay | Admitting: Nurse Practitioner

## 2015-01-18 DIAGNOSIS — M79605 Pain in left leg: Principal | ICD-10-CM

## 2015-01-18 DIAGNOSIS — M79604 Pain in right leg: Secondary | ICD-10-CM

## 2015-01-23 ENCOUNTER — Inpatient Hospital Stay: Admission: RE | Admit: 2015-01-23 | Payer: Medicaid Other | Source: Ambulatory Visit

## 2015-01-23 ENCOUNTER — Other Ambulatory Visit: Payer: Medicaid Other

## 2015-01-25 ENCOUNTER — Other Ambulatory Visit: Payer: Medicaid Other

## 2015-02-01 ENCOUNTER — Ambulatory Visit
Admission: RE | Admit: 2015-02-01 | Discharge: 2015-02-01 | Disposition: A | Discharge status: Home / Self Care | Payer: Medicaid Other | Source: Ambulatory Visit | Attending: Nurse Practitioner | Admitting: Nurse Practitioner

## 2015-02-01 DIAGNOSIS — M79605 Pain in left leg: Principal | ICD-10-CM

## 2015-02-01 DIAGNOSIS — M79604 Pain in right leg: Secondary | ICD-10-CM

## 2015-05-04 ENCOUNTER — Other Ambulatory Visit: Payer: Self-pay | Admitting: Family Medicine

## 2015-05-04 DIAGNOSIS — Z1231 Encounter for screening mammogram for malignant neoplasm of breast: Secondary | ICD-10-CM

## 2015-08-27 ENCOUNTER — Other Ambulatory Visit: Payer: Self-pay | Admitting: Orthopedic Surgery

## 2015-08-27 DIAGNOSIS — M545 Low back pain: Secondary | ICD-10-CM

## 2015-09-12 ENCOUNTER — Ambulatory Visit
Admission: RE | Admit: 2015-09-12 | Discharge: 2015-09-12 | Disposition: A | Discharge status: Home / Self Care | Payer: Medicaid Other | Source: Ambulatory Visit | Attending: Family Medicine | Admitting: Family Medicine

## 2015-09-12 DIAGNOSIS — Z1231 Encounter for screening mammogram for malignant neoplasm of breast: Secondary | ICD-10-CM

## 2015-10-22 IMAGING — CR DG CHEST 2V
2 series · 2 of 2 positions shown · non-contrast
Comparison: July 17, 2013.

CLINICAL DATA: Chest pain.

EXAM:
CHEST  2 VIEW

[w chest pa]
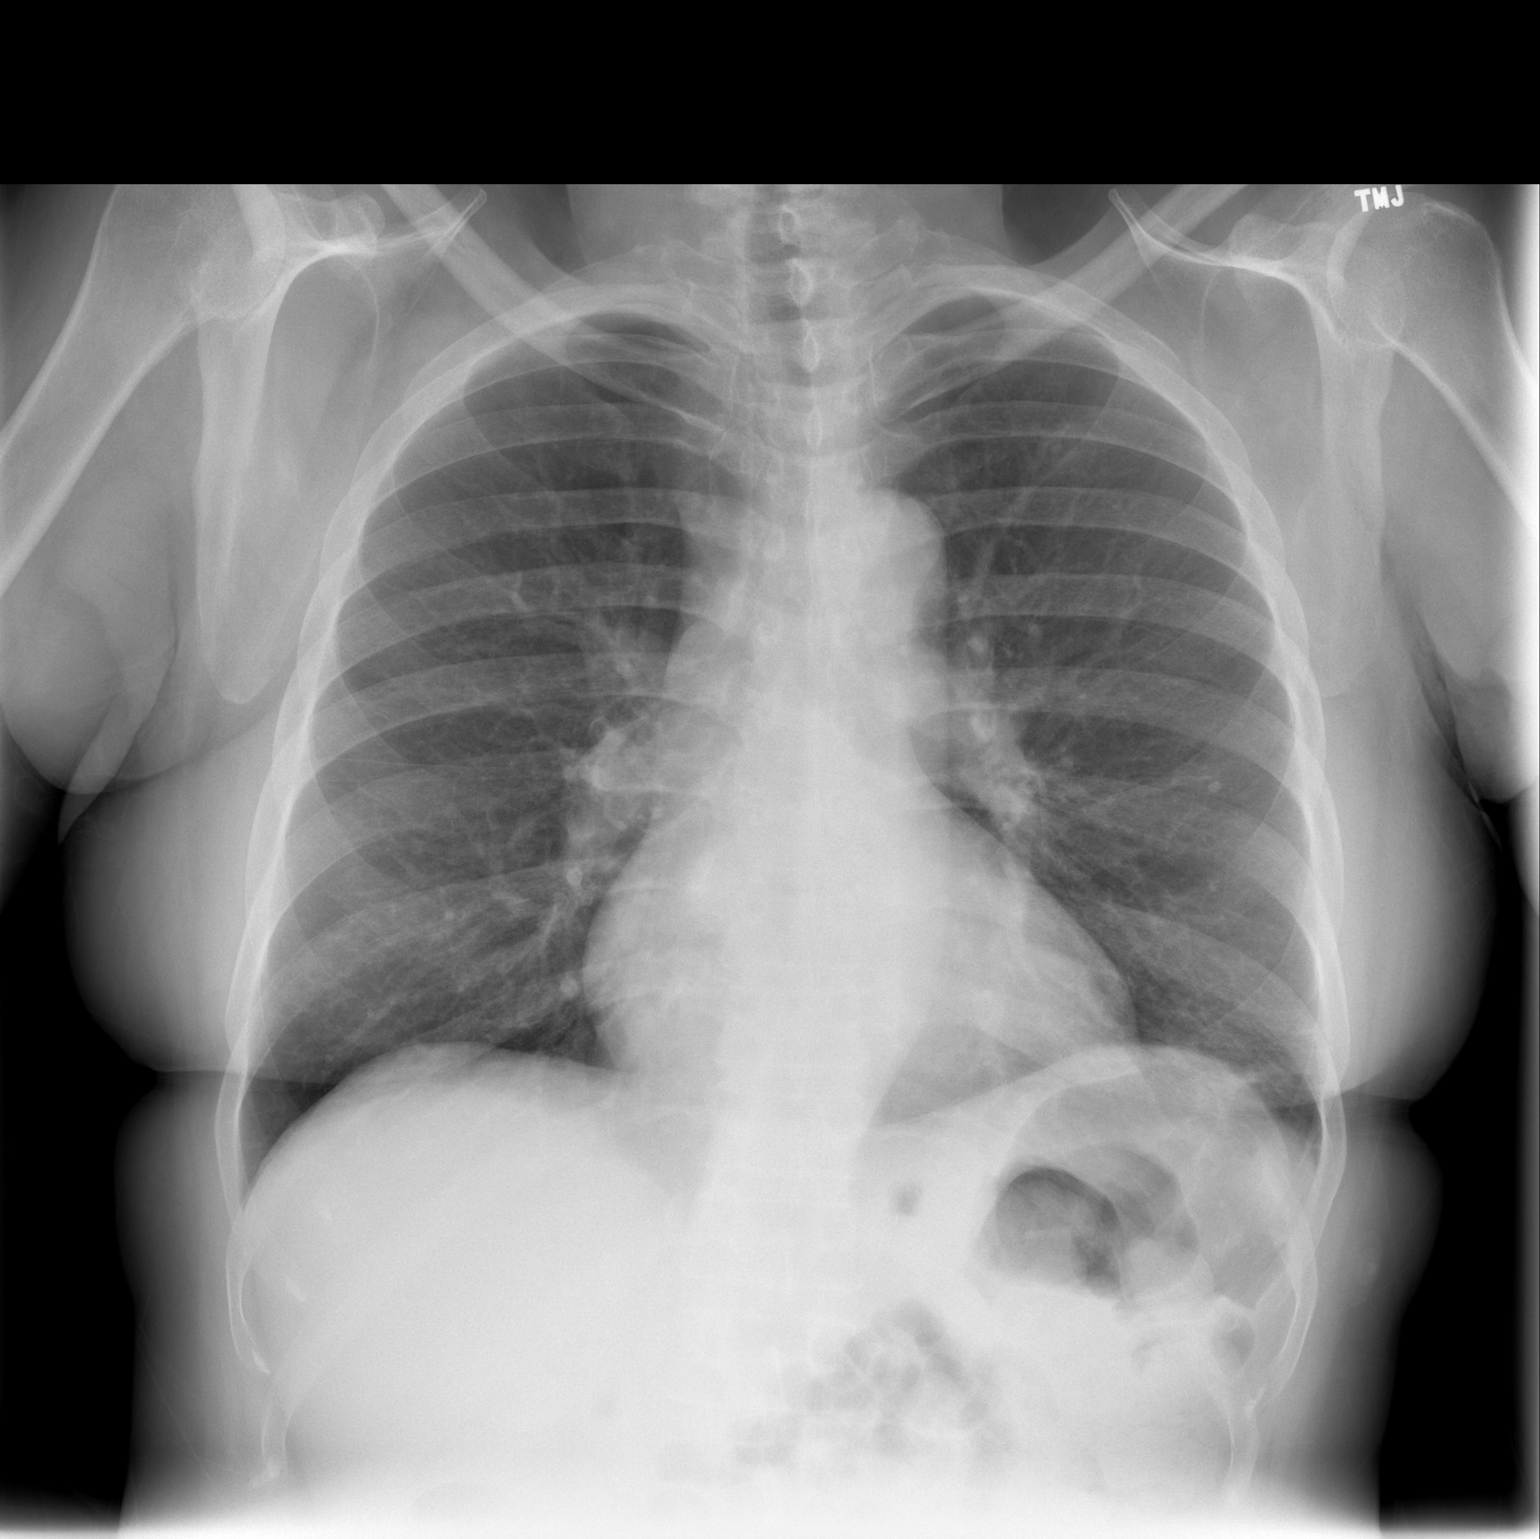

[w chest lat]
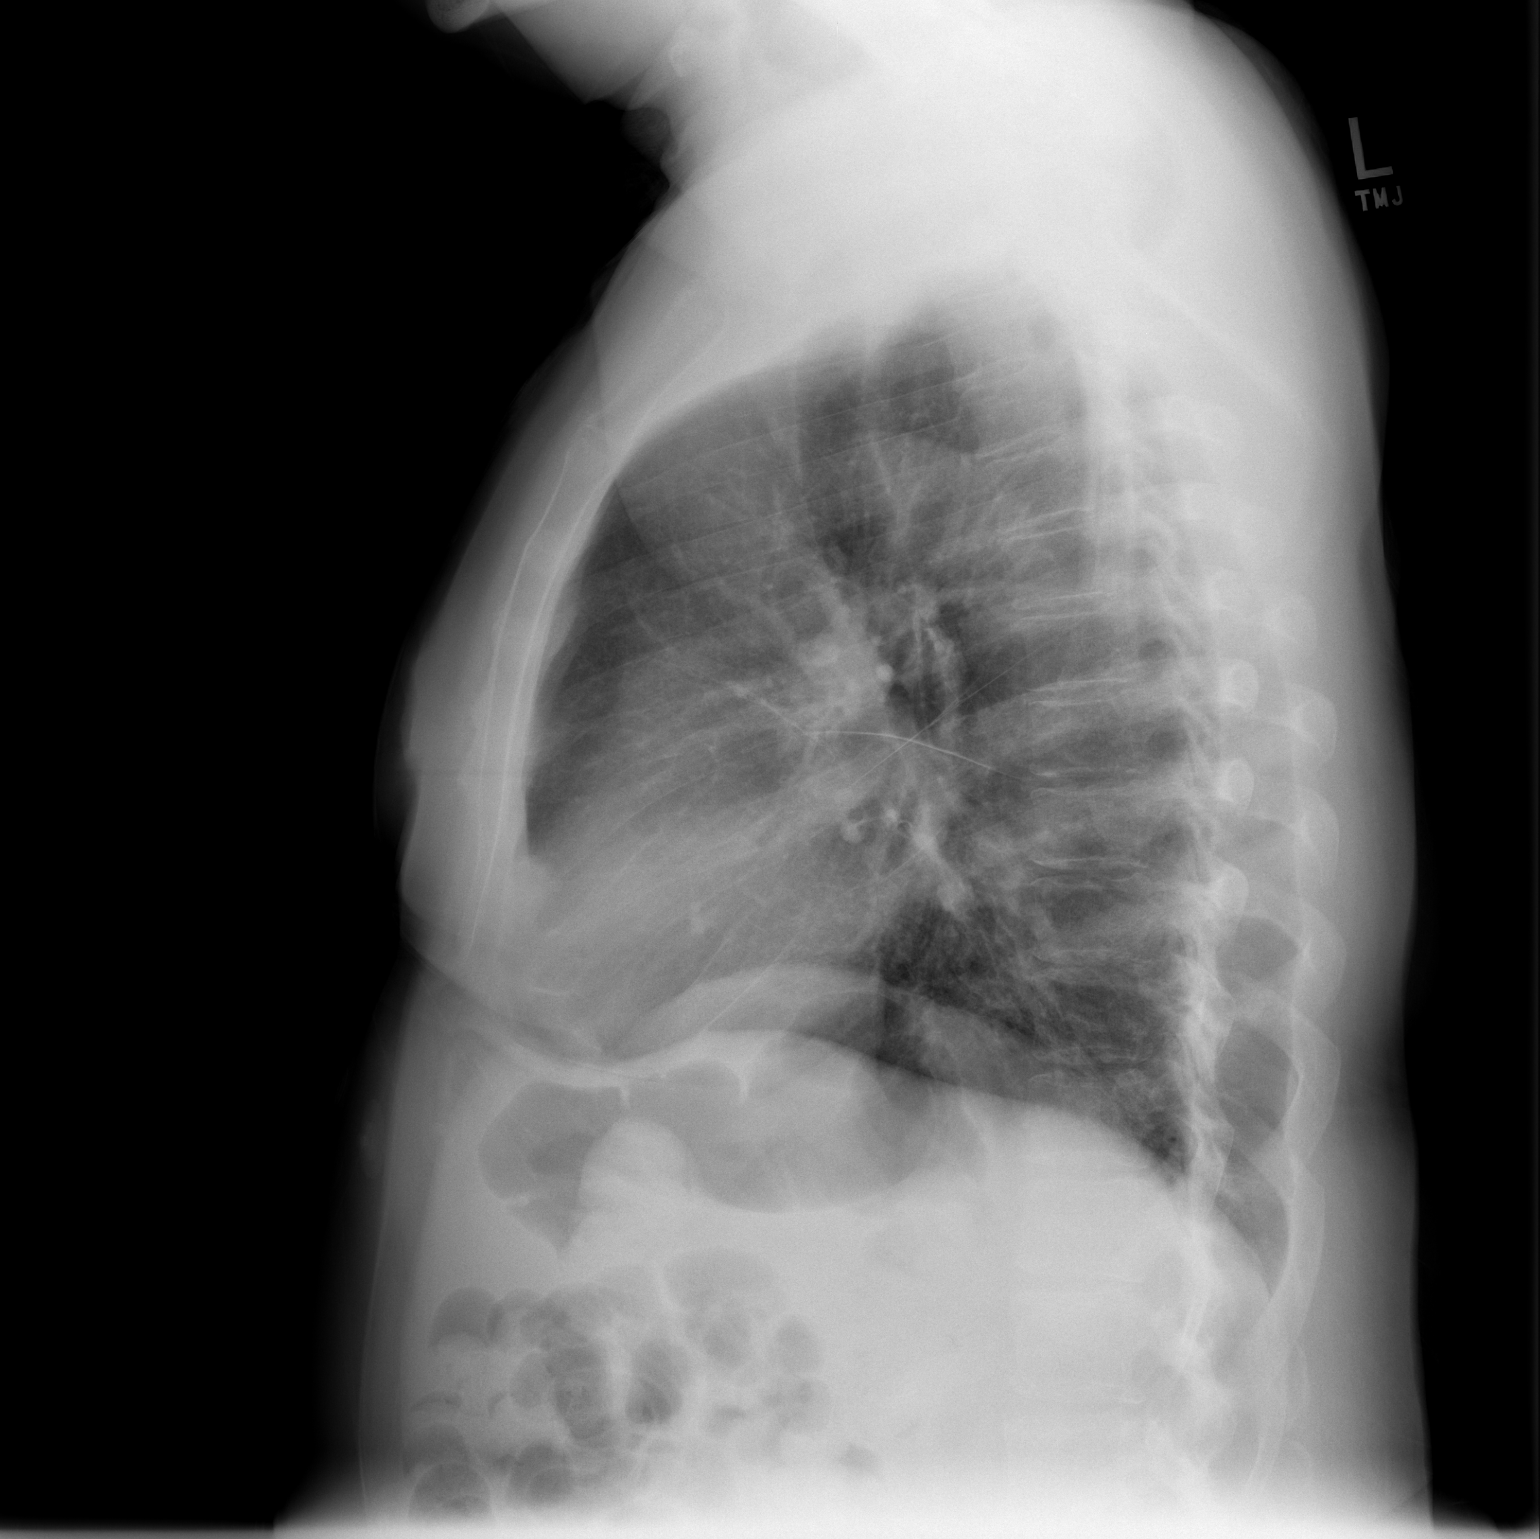

[2 of 2 positions shown; findings below may reference images not displayed]

FINDINGS: The heart size and mediastinal contours are within normal limits.
Both lungs are clear. No pneumothorax or pleural effusion is noted.
Lingular opacity seen on lateral radiograph of prior study is not
well visualized currently. The visualized skeletal structures are
unremarkable.
IMPRESSION: No active cardiopulmonary disease.

## 2015-10-23 IMAGING — CT CT HEAD W/O CM
1 series · 16 of 30 positions shown, 20 images · non-contrast
Comparison: Prior CT from 01/19/2013

CLINICAL DATA: Intermittent right-sided weakness

EXAM:
CT HEAD WITHOUT CONTRAST
TECHNIQUE: Contiguous axial images were obtained from the base of the skull
through the vertex without intravenous contrast.

[Series 2: head 5.0 h30s · axial · 0.41mm/px · z∈[-159,-24]mm · 16 of 30 slices shown, 20 images]
[im 2/30  brain]
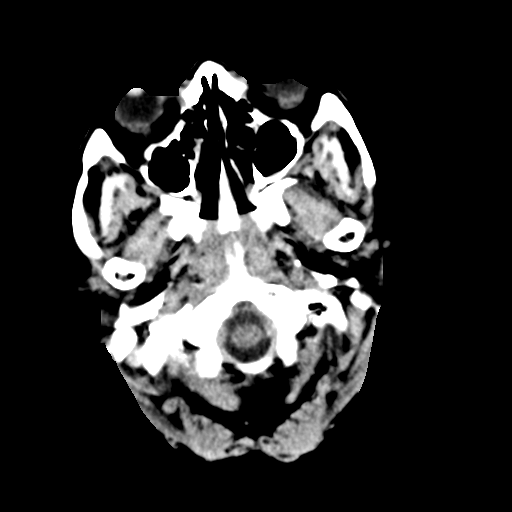
[im 2/30  bone]
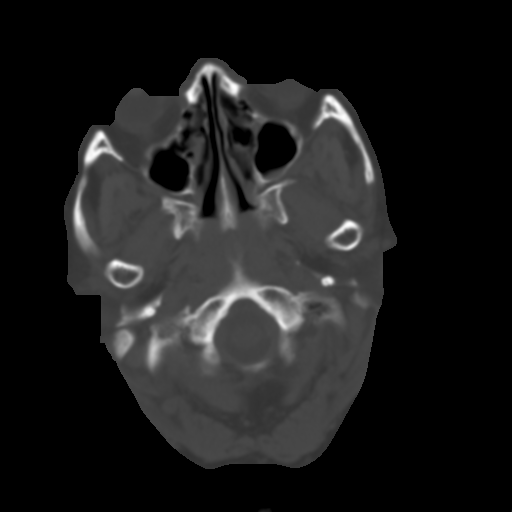
[im 4/30  brain]
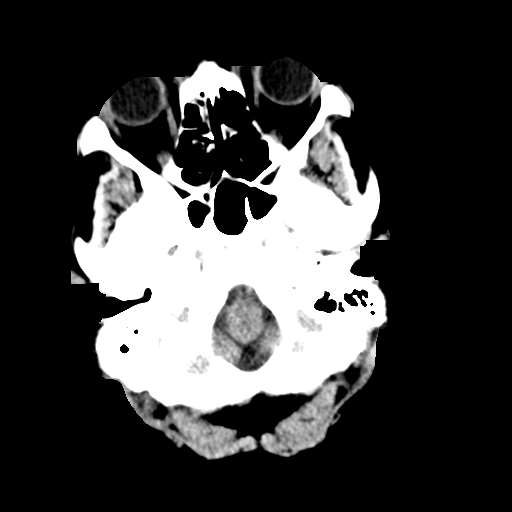
[im 6/30  brain]
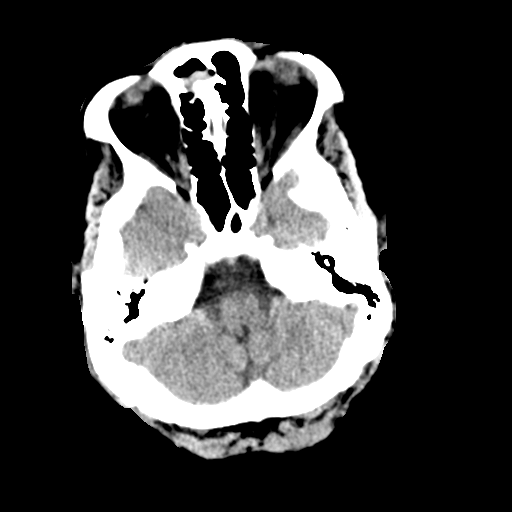
[im 8/30  brain]
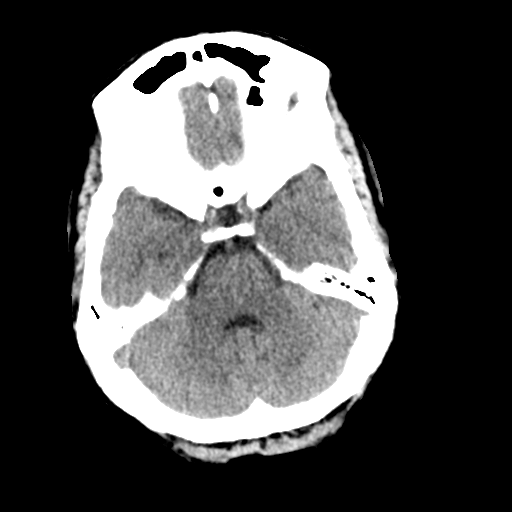
[im 9/30  brain]
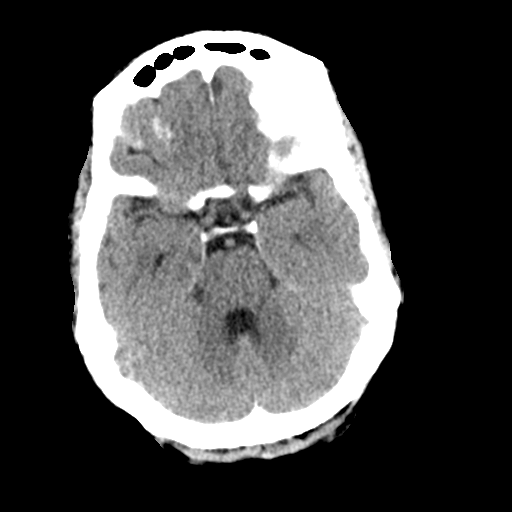
[im 9/30  bone]
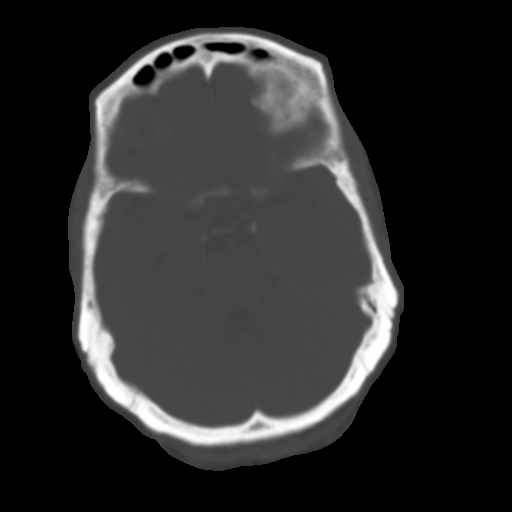
[im 11/30  brain]
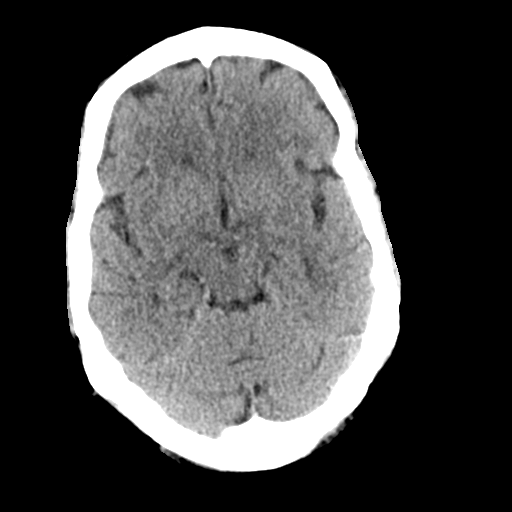
[im 13/30  brain]
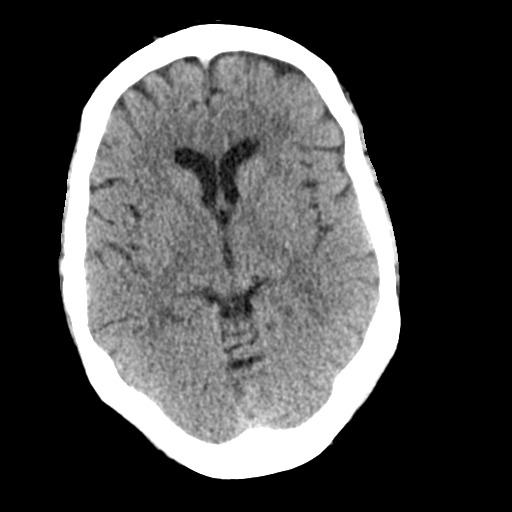
[im 15/30  brain]
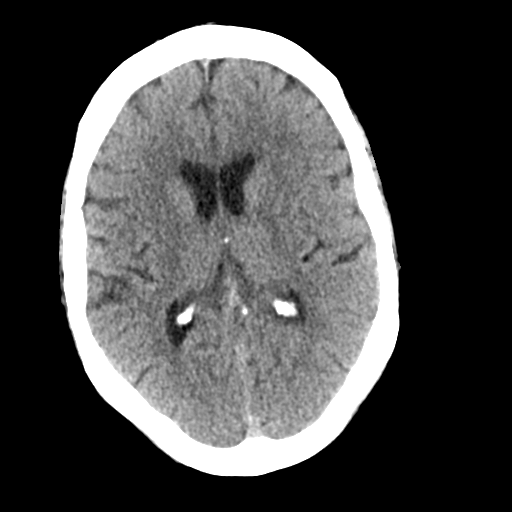
[im 16/30  brain]
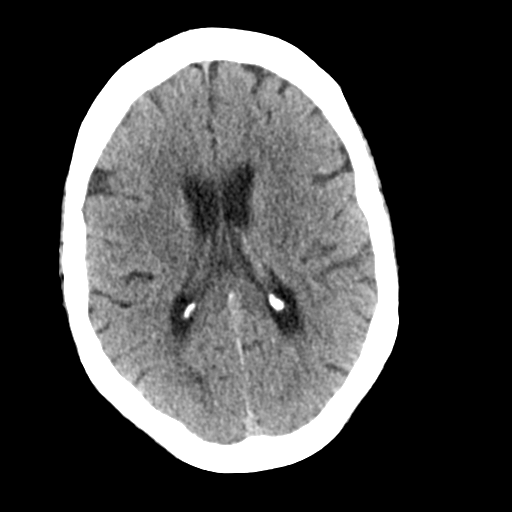
[im 16/30  bone]
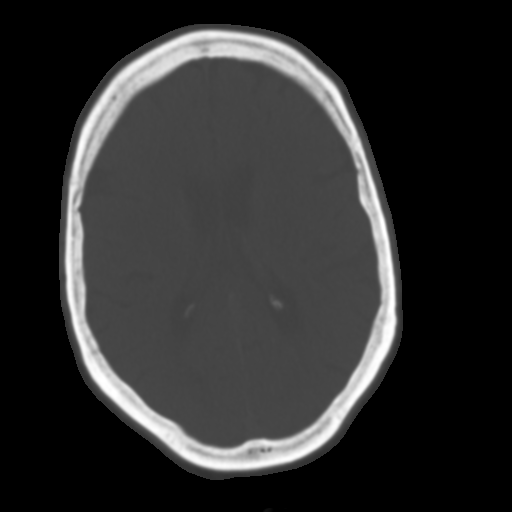
[im 18/30  brain]
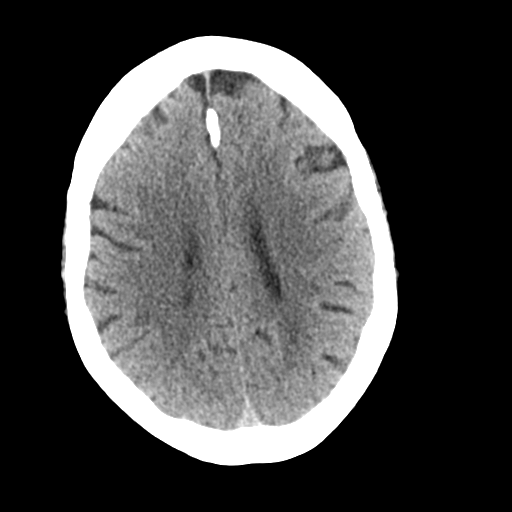
[im 20/30  brain]
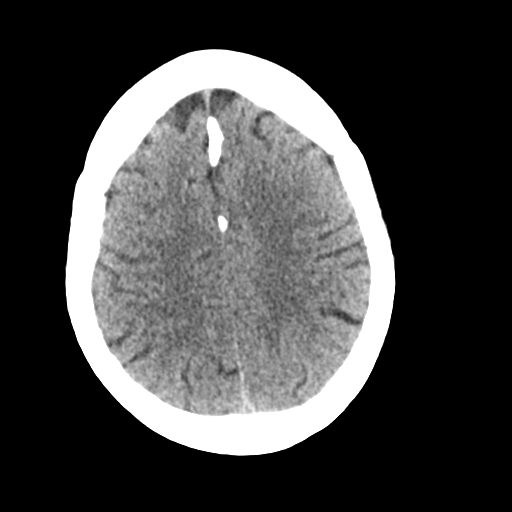
[im 22/30  brain]
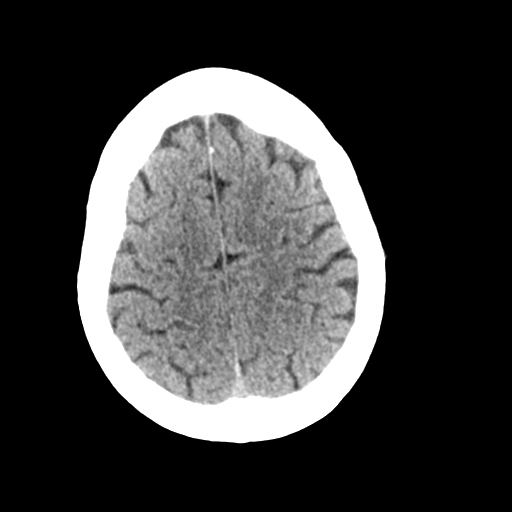
[im 23/30  brain]
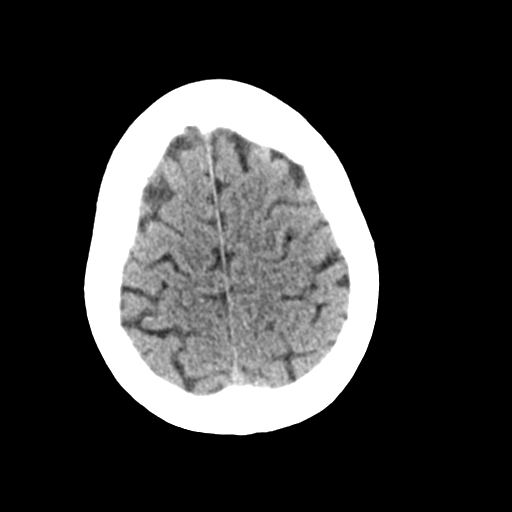
[im 23/30  bone]
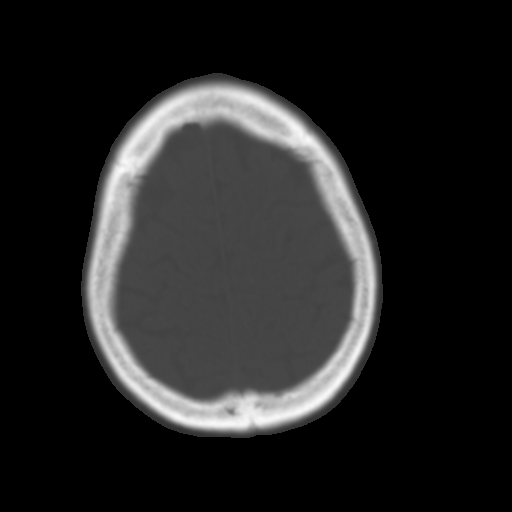
[im 25/30  brain]
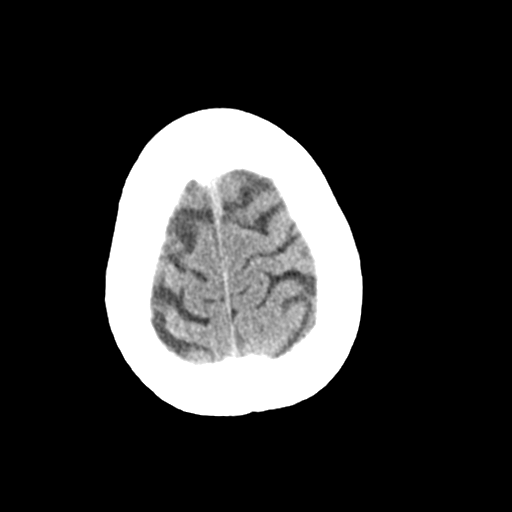
[im 27/30  brain]
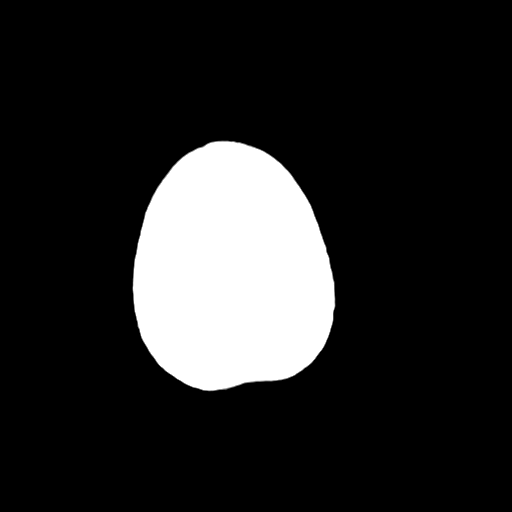
[im 29/30  brain]
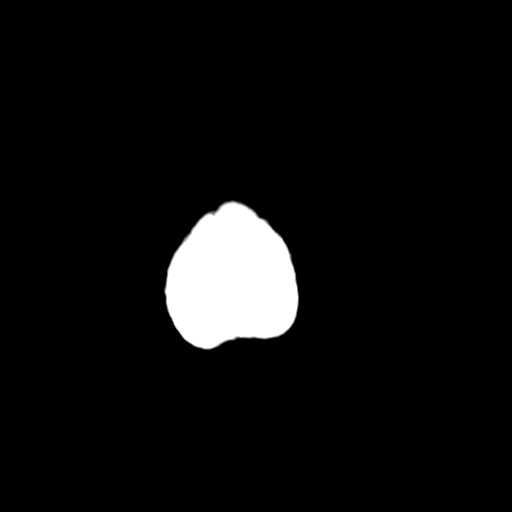

[16 of 30 positions shown; findings below may reference images not displayed]

FINDINGS: Mild age-related atrophy with moderate chronic microvascular
ischemic changes again noted, unchanged.

There is no acute intracranial hemorrhage or infarct. No mass lesion
or midline shift. Gray-white matter differentiation is well
maintained. Ventricles are normal in size without evidence of
hydrocephalus. No extra-axial fluid collection.

The calvarium is intact.

Orbital soft tissues are within normal limits.

Moderate mucoperiosteal thickening present within the ethmoidal air
cells with layering opacity within the partially visualized left
maxillary sinus. Scattered mucosal thickening noted within the right
frontal sinus and right frontoethmoidal recess.

Scalp soft tissues are unremarkable.
IMPRESSION: 1. No acute intracranial process.
2. Moderate paranasal sinus disease as above.

## 2016-01-25 ENCOUNTER — Encounter (HOSPITAL_COMMUNITY): Payer: Self-pay

## 2016-01-25 ENCOUNTER — Emergency Department (HOSPITAL_COMMUNITY)
Admission: EM | Admit: 2016-01-25 | Discharge: 2016-01-25 | Disposition: A | Discharge status: Home / Self Care | Payer: Medicaid Other | Attending: Emergency Medicine | Admitting: Emergency Medicine

## 2016-01-25 DIAGNOSIS — R51 Headache: Secondary | ICD-10-CM | POA: Diagnosis not present

## 2016-01-25 DIAGNOSIS — I10 Essential (primary) hypertension: Secondary | ICD-10-CM | POA: Diagnosis not present

## 2016-01-25 DIAGNOSIS — J449 Chronic obstructive pulmonary disease, unspecified: Secondary | ICD-10-CM | POA: Insufficient documentation

## 2016-01-25 DIAGNOSIS — Z91040 Latex allergy status: Secondary | ICD-10-CM | POA: Diagnosis not present

## 2016-01-25 DIAGNOSIS — R519 Headache, unspecified: Secondary | ICD-10-CM

## 2016-01-25 DIAGNOSIS — J351 Hypertrophy of tonsils: Secondary | ICD-10-CM | POA: Diagnosis not present

## 2016-01-25 DIAGNOSIS — F17210 Nicotine dependence, cigarettes, uncomplicated: Secondary | ICD-10-CM | POA: Insufficient documentation

## 2016-01-25 DIAGNOSIS — Z79899 Other long term (current) drug therapy: Secondary | ICD-10-CM | POA: Insufficient documentation

## 2016-01-25 MED ORDER — BUTALBITAL-APAP-CAFFEINE 50-325-40 MG PO TABS: 1.0000 | ORAL_TABLET | Freq: Four times a day (QID) | ORAL | 0 refills | Status: AC | PRN

## 2016-01-25 MED ORDER — DIPHENHYDRAMINE HCL 50 MG/ML IJ SOLN
12.5000 mg | Freq: Once | INTRAMUSCULAR | Status: AC
Start: 2016-01-25 — End: 2016-01-25
  Administered 2016-01-25: 12.5 mg via INTRAVENOUS
  Filled 2016-01-25: qty 1

## 2016-01-25 MED ORDER — SODIUM CHLORIDE 0.9 % IV BOLUS (SEPSIS)
1000.0000 mL | Freq: Once | INTRAVENOUS | Status: AC
  Administered 2016-01-25: 1000 mL via INTRAVENOUS

## 2016-01-25 MED ORDER — OXYCODONE-ACETAMINOPHEN 5-325 MG PO TABS
1.0000 | ORAL_TABLET | Freq: Once | ORAL | Status: AC
  Administered 2016-01-25: 1 via ORAL
  Filled 2016-01-25: qty 1

## 2016-01-25 MED ORDER — BENZOCAINE-MENTHOL 6-10 MG MT LOZG: 1.0000 | LOZENGE | OROMUCOSAL | 0 refills | Status: DC | PRN

## 2016-01-25 MED ORDER — METOCLOPRAMIDE HCL 5 MG/ML IJ SOLN
10.0000 mg | Freq: Once | INTRAMUSCULAR | Status: AC
  Administered 2016-01-25: 10 mg via INTRAVENOUS
  Filled 2016-01-25: qty 2

## 2016-01-25 NOTE — ED Provider Notes (Signed)
[facility] DEPT Provider Note   CSN: [id number] Arrival date & time: 01/25/16  1504  First Provider Contact:  First MD Initiated Contact with Patient 01/25/16 1759        History   Chief Complaint Chief Complaint  Patient presents with  . Sore Throat  . Migraine   HPI   Denise Simmons is an 65 y.o. female who presents to the ED for evaluation of a migraine as well as a swollen tonsil. Regarding her migraine, she reports a dull headache behind her right eye that has increased in severity over the day. She states she has a long history of migraines and this feels identical. It is not the worst headache she has ever had. She endorses photophobia and some nausea but denies emesis. Denies blurred vision. She has tried topamax with no relief; she states topamax does not ever work for her.   She is also reporting a sore throat for the past month with swelling on her left side. She states her PCP tested her for strep and it was negative and she has used lidocaine mouthwash with no relief. She states sometimes the swollen area makes it feel like it is hard to swallow but she has not choked on food. Denies dysphagia or trismus now. Denies drooling or difficulty breathing. She has not been evaluated by ENT.   Past Medical History:  Diagnosis Date  . Allergic rhinitis   . Arthritis    rheumatoid  . COPD (chronic obstructive pulmonary disease) ([place])   . Fibromyalgia   . Hypertension   . Insomnia   . Neck pain   . Raynaud's disease     Patient Active Problem List   Diagnosis Date Noted  . Rheumatoid arthritis(714.0) 08/07/2011  . Smoker 08/07/2011  . Hypertension 06/20/2011  . COPD (chronic obstructive pulmonary disease) ([place]) 06/19/2011    Past Surgical History:  Procedure Laterality Date  . ABDOMINAL HYSTERECTOMY    . BREAST BIOPSY    . HEMORROIDECTOMY    . LUMBAR LAMINECTOMY/DECOMPRESSION MICRODISCECTOMY  10/09/2011   Procedure: LUMBAR LAMINECTOMY/DECOMPRESSION  MICRODISCECTOMY 1 LEVEL;  Surgeon: Erline Levine, MD;  Location: [facility];  Service: Neurosurgery;  Laterality: Right;  RIGHT Lumbar four-five foraminotomy with possible microdiskectomy  . TUBAL LIGATION      OB History    No data available       Home Medications    Prior to Admission medications   Medication Sig Start Date End Date Taking? Authorizing Provider  adalimumab (HUMIRA PEN) 40 MG/0.8ML injection Inject 40 mg into the skin every 14 (fourteen) days.    Historical Provider, MD  albuterol (PROVENTIL HFA;VENTOLIN HFA) 108 (90 BASE) MCG/ACT inhaler Inhale 2 puffs into the lungs 3 (three) times daily as needed. For shortness of breath     Historical Provider, MD  albuterol (PROVENTIL) (2.5 MG/3ML) 0.083% nebulizer solution Take 2.5 mg by nebulization every 6 (six) hours as needed for wheezing or shortness of breath.    Historical Provider, MD  amitriptyline (ELAVIL) 150 MG tablet Take 150 mg by mouth at bedtime.      Historical Provider, MD  bisacodyl (DULCOLAX) 5 MG EC tablet Take 5 mg by mouth daily as needed for moderate constipation.    Historical Provider, MD  bisoprolol (ZEBETA) 5 MG tablet Take 5 mg by mouth daily.    Tanda Rockers, MD  cetirizine (ZYRTEC) 10 MG tablet Take 10 mg by mouth daily.    Historical Provider, MD  diazepam (VALIUM) 5  MG tablet Take 5 mg by mouth daily as needed for anxiety or sleep.    Historical Provider, MD  Emollient (CERAVE) CREA Apply 1 application topically 2 (two) times daily as needed (break outs).     Historical Provider, MD  esomeprazole (NEXIUM) 40 MG capsule Take 40 mg by mouth 2 (two) times daily.      Historical Provider, MD  famotidine (PEPCID) 20 MG tablet Take 20 mg by mouth at bedtime. With nexium    Historical Provider, MD  montelukast (SINGULAIR) 10 MG tablet Take 10 mg by mouth at bedtime.      Historical Provider, MD  Olopatadine HCl (PATADAY) 0.2 % SOLN Place 1 drop into both eyes 2 (two) times daily.     Historical Provider,  MD  oxyCODONE-acetaminophen (PERCOCET) 10-325 MG per tablet Take 0.5 tablets by mouth every 6 (six) hours as needed. For pain Patient not taking: Reported on 10/31/2014 05/12/12   [name], MD  oxyCODONE-acetaminophen (PERCOCET/ROXICET) 5-325 MG per tablet Take 2 tablets by mouth every 4 (four) hours as needed. 10/15/13   [name], MD  oxyCODONE-acetaminophen (PERCOCET/ROXICET) 5-325 MG per tablet Take 1 tablet by mouth every 6 (six) hours as needed for severe pain. Patient not taking: Reported on 10/31/2014 04/16/14   [name], PA-C  topiramate (TOPAMAX) 50 MG tablet Take 50 mg by mouth as needed (mirgaine).     Historical Provider, MD  triamcinolone cream (KENALOG) 0.1 % Apply topically 3 (three) times daily. Patient not taking: Reported on 10/31/2014 08/22/12   [name], MD  zolpidem (AMBIEN) 10 MG tablet Take 5-10 mg by mouth at bedtime as needed for sleep.     Historical Provider, MD    Family History Family History  Problem Relation Age of Onset  . Rheum arthritis Mother   . Breast cancer Mother   . Breast cancer Sister   . Ovarian cancer Maternal Grandmother   . Rectal cancer Paternal Grandmother     Social History Social History  Substance Use Topics  . Smoking status: Current Every Day Smoker    Packs/day: 1.00    Years: 25.00    Types: Cigarettes  . Smokeless tobacco: Never Used  . Alcohol use No     Comment: Quit in 2001     Allergies   Celebrex [celecoxib]; Doxycycline; Fentanyl; Lisinopril; Lyrica [pregabalin]; Naproxen; Nsaids; Prednisone; Sumatriptan; Estradiol; Fish allergy; Methotrexate derivatives; Latex; Penicillins; and Sulfa antibiotics   Review of Systems Review of Systems  All other systems reviewed and are negative.    Physical Exam Updated Vital Signs BP 127/71 (BP Location: Right Arm)   Pulse (!) 53   Temp 98.7 F (37.1 C) (Oral)   Resp 18   SpO2 95%   Physical Exam  Constitutional: She is oriented to person, place, and  time.  HENT:  Right Ear: External ear normal.  Left Ear: External ear normal.  Nose: Nose normal.  Mouth/Throat: Oropharynx is clear and moist. No oropharyngeal exudate.  Left tonsil is slightly enlarged compared to right but no exudate, no evidence of abscess. Uvula midline and airway is widely patent. No trismus. No oral swelling. No drooling.  +L anterior cervical lymphadenopathy  Eyes: Conjunctivae and EOM are normal. Pupils are equal, round, and reactive to light.  Neck: Normal range of motion. Neck supple.  Cardiovascular: Normal rate, regular rhythm, normal heart sounds and intact distal pulses.   Pulmonary/Chest: Effort normal and breath sounds normal. No respiratory distress. She has no wheezes. She  exhibits no tenderness.  Abdominal: Soft. Bowel sounds are normal. She exhibits no distension. There is no tenderness. There is no rebound and no guarding.  Musculoskeletal: She exhibits no edema.  Neurological: She is alert and oriented to person, place, and time. She has normal reflexes. She displays normal reflexes. No cranial nerve deficit. Coordination normal.  Skin: Skin is warm and dry.  Psychiatric: She has a normal mood and affect.  Nursing note and vitals reviewed.    ED Treatments / Results  Labs (all labs ordered are listed, but only abnormal results are displayed) Labs Reviewed - No data to display  EKG  EKG Interpretation None       Radiology No results found.  Procedures Procedures (including critical care time)  Medications Ordered in ED Medications  sodium chloride 0.9 % bolus 1,000 mL (0 mLs Intravenous Stopped 01/25/16 1949)  metoCLOPramide (REGLAN) injection 10 mg (10 mg Intravenous Given 01/25/16 1830)  diphenhydrAMINE (BENADRYL) injection 12.5 mg (12.5 mg Intravenous Given 01/25/16 1830)  oxyCODONE-acetaminophen (PERCOCET/ROXICET) 5-325 MG per tablet 1 tablet (1 tablet Oral Given 01/25/16 1830)     Initial Impression / Assessment and Plan / ED  Course  I have reviewed the triage vital signs and the nursing notes.  Pertinent labs & imaging results that were available during my care of the patient were reviewed by me and considered in my medical decision making (see chart for details).  Clinical Course   Migraine resolved int he ED. Pt does have asymmetric tonsillar enlargement and I can appreciate some mild unilateral anterior cervical lymphadenopathy on exam. However she is nontoxic appearing, afebrile, with no evidence of airway compromise. No evidence of paratonsillar abscess or retropharyngeal abscess. Discussed with attending Dr. Laverta Baltimore. We will refer to outpatient ENT. In the meantime will trial lozenges for her sore throat as needed. Encouraged follow up with neuro as well given recurrent migraines despite topamax therapy at home. ER return precautions given.  Final Clinical Impressions(s) / ED Diagnoses   Final diagnoses:  Acute nonintractable headache, unspecified headache type  Tonsillar enlargement    New Prescriptions Discharge Medication List as of 01/25/2016  7:47 PM    START taking these medications   Details  benzocaine-menthol (CHLORAEPTIC) 6-10 MG lozenge Take 1 lozenge by mouth as needed for sore throat., Starting Sat 01/25/2016, Print    butalbital-acetaminophen-caffeine (FIORICET) 50-325-40 MG tablet Take 1-2 tablets by mouth every 6 (six) hours as needed for headache., Starting Sat 01/25/2016, Until Sun 01/24/2017, Print         Anne Ng, PA-C 01/26/16 [place], MD 01/26/16 1401

## 2016-01-25 NOTE — Discharge Instructions (Signed)
Please call Dr. Erik Obey for ENT (ear, nose, and throat) follow up. Please also follow up with your neurologist.

## 2016-01-25 NOTE — ED Triage Notes (Signed)
Patient here with ongoing left sided sore throat and migraine headache for several days. Seen by her primary MD and negative strep. Has been using the lidocaine with no relief. No trouble swallowing. Alert and oriented

## 2016-03-10 ENCOUNTER — Encounter (HOSPITAL_COMMUNITY): Payer: Self-pay | Admitting: *Deleted

## 2016-03-10 ENCOUNTER — Emergency Department (HOSPITAL_COMMUNITY)
Admission: EM | Admit: 2016-03-10 | Discharge: 2016-03-10 | Disposition: A | Discharge status: Home / Self Care | Payer: Medicaid Other | Attending: Emergency Medicine | Admitting: Emergency Medicine

## 2016-03-10 DIAGNOSIS — I10 Essential (primary) hypertension: Secondary | ICD-10-CM | POA: Insufficient documentation

## 2016-03-10 DIAGNOSIS — M545 Low back pain: Secondary | ICD-10-CM | POA: Insufficient documentation

## 2016-03-10 DIAGNOSIS — R51 Headache: Secondary | ICD-10-CM | POA: Diagnosis not present

## 2016-03-10 DIAGNOSIS — Y92410 Unspecified street and highway as the place of occurrence of the external cause: Secondary | ICD-10-CM | POA: Insufficient documentation

## 2016-03-10 DIAGNOSIS — F17210 Nicotine dependence, cigarettes, uncomplicated: Secondary | ICD-10-CM | POA: Insufficient documentation

## 2016-03-10 DIAGNOSIS — J449 Chronic obstructive pulmonary disease, unspecified: Secondary | ICD-10-CM | POA: Insufficient documentation

## 2016-03-10 DIAGNOSIS — Y999 Unspecified external cause status: Secondary | ICD-10-CM | POA: Insufficient documentation

## 2016-03-10 DIAGNOSIS — M25512 Pain in left shoulder: Secondary | ICD-10-CM | POA: Insufficient documentation

## 2016-03-10 DIAGNOSIS — M546 Pain in thoracic spine: Secondary | ICD-10-CM | POA: Diagnosis not present

## 2016-03-10 DIAGNOSIS — M542 Cervicalgia: Secondary | ICD-10-CM | POA: Insufficient documentation

## 2016-03-10 DIAGNOSIS — Y9389 Activity, other specified: Secondary | ICD-10-CM | POA: Insufficient documentation

## 2016-03-10 DIAGNOSIS — Z79899 Other long term (current) drug therapy: Secondary | ICD-10-CM | POA: Insufficient documentation

## 2016-03-10 MED ORDER — HYDROCODONE-ACETAMINOPHEN 5-325 MG PO TABS
1.0000 | ORAL_TABLET | Freq: Once | ORAL | Status: AC
  Administered 2016-03-10: 1 via ORAL
  Filled 2016-03-10: qty 1

## 2016-03-10 MED ORDER — CYCLOBENZAPRINE HCL 10 MG PO TABS: 10.0000 mg | ORAL_TABLET | Freq: Two times a day (BID) | ORAL | 0 refills | Status: AC | PRN
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
## 2016-03-10 NOTE — ED Triage Notes (Signed)
Pt called to treatment room-- no response
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
## 2016-03-19 ENCOUNTER — Ambulatory Visit
Admission: RE | Admit: 2016-03-19 | Discharge: 2016-03-19 | Disposition: A | Discharge status: Home / Self Care | Payer: Self-pay | Source: Ambulatory Visit | Attending: Nurse Practitioner | Admitting: Nurse Practitioner

## 2016-03-19 ENCOUNTER — Other Ambulatory Visit: Payer: Self-pay | Admitting: Nurse Practitioner

## 2016-03-19 DIAGNOSIS — IMO0002 Reserved for concepts with insufficient information to code with codable children: Secondary | ICD-10-CM

## 2016-05-13 ENCOUNTER — Telehealth: Payer: Self-pay | Admitting: Diagnostic Neuroimaging

## 2016-05-13 NOTE — Telephone Encounter (Signed)
Ok to switch. Patient would be considered new since last seen in 2013. -VRP

## 2016-05-30 ENCOUNTER — Encounter (HOSPITAL_COMMUNITY): Payer: Self-pay | Admitting: *Deleted

## 2016-05-30 ENCOUNTER — Emergency Department (HOSPITAL_COMMUNITY)
Admission: EM | Admit: 2016-05-30 | Discharge: 2016-05-30 | Disposition: A | Discharge status: Home / Self Care | Payer: Medicare Other | Attending: Dermatology | Admitting: Dermatology

## 2016-05-30 DIAGNOSIS — I10 Essential (primary) hypertension: Secondary | ICD-10-CM | POA: Insufficient documentation

## 2016-05-30 DIAGNOSIS — J449 Chronic obstructive pulmonary disease, unspecified: Secondary | ICD-10-CM | POA: Insufficient documentation

## 2016-05-30 DIAGNOSIS — Z5321 Procedure and treatment not carried out due to patient leaving prior to being seen by health care provider: Secondary | ICD-10-CM | POA: Insufficient documentation

## 2016-05-30 DIAGNOSIS — J029 Acute pharyngitis, unspecified: Secondary | ICD-10-CM | POA: Diagnosis present

## 2016-05-30 DIAGNOSIS — F17210 Nicotine dependence, cigarettes, uncomplicated: Secondary | ICD-10-CM | POA: Insufficient documentation

## 2016-05-30 NOTE — ED Notes (Signed)
Called pt again in lobby with no answer

## 2016-05-30 NOTE — ED Notes (Signed)
Called pt for room with no answer in lobby

## 2016-05-30 NOTE — ED Triage Notes (Signed)
Pt reports ongoing sore throat and has been to ENT for it, unable to get her tonsils removed. Having increase in swelling, reports its more swollen on the left side and feels like throat is closing. Denies fever.

## 2016-05-30 NOTE — ED Notes (Signed)
This RN called pt in waiting room multiple times, no answer.

## 2016-05-30 NOTE — ED Notes (Signed)
Called x 2 no answer

## 2016-06-16 ENCOUNTER — Ambulatory Visit: Payer: Medicaid Other | Admitting: Neurology

## 2016-07-03 ENCOUNTER — Ambulatory Visit (INDEPENDENT_AMBULATORY_CARE_PROVIDER_SITE_OTHER): Payer: Medicare Other | Admitting: Internal Medicine

## 2016-07-03 ENCOUNTER — Encounter: Payer: Self-pay | Admitting: Internal Medicine

## 2016-07-03 VITALS — BP 124/84 | HR 55 | Ht 66.5 in | Wt 159.8 lb

## 2016-07-03 DIAGNOSIS — R058 Other specified cough: Secondary | ICD-10-CM

## 2016-07-03 DIAGNOSIS — F17210 Nicotine dependence, cigarettes, uncomplicated: Secondary | ICD-10-CM

## 2016-07-03 DIAGNOSIS — J449 Chronic obstructive pulmonary disease, unspecified: Secondary | ICD-10-CM | POA: Diagnosis not present

## 2016-07-03 DIAGNOSIS — I10 Essential (primary) hypertension: Secondary | ICD-10-CM | POA: Diagnosis not present

## 2016-07-03 DIAGNOSIS — R05 Cough: Secondary | ICD-10-CM

## 2016-07-03 NOTE — Patient Instructions (Addendum)
GERD (REFLUX)  is an extremely common cause of respiratory symptoms just like yours , many times with no obvious heartburn at all.    It can be treated with medication, but also with lifestyle changes including elevation of the head of your bed (ideally with 6 inch  bed blocks),  Smoking cessation, avoidance of late meals, excessive alcohol, and avoid fatty foods, chocolate, peppermint, colas, red wine, and acidic juices such as orange juice.  NO MINT OR MENTHOL PRODUCTS SO NO COUGH DROPS   USE SUGARLESS CANDY INSTEAD (Jolley ranchers or Stover's or Life Savers) or even ice chips will also do - the key is to swallow to prevent all throat clearing. NO OIL BASED VITAMINS - use powdered substitutes.    The key is to stop smoking completely before smoking completely stops you! - it's not too late    Plan B = Backup Only use your albuterol as a rescue medication to be used if you can't catch your breath by resting or doing a relaxed purse lip breathing pattern.  - The less you use it, the better it will work when you need it. - Ok to use the inhaler up to 2 puffs  every 4 hours if you must but call for appointment if use goes up over your usual need - Don't leave home without it !!  (think of it like the spare tire for your car)   Plan C = Crisis - only use your albuterol nebulizer if you first try Plan B and it fails to help > ok to use the nebulizer up to every 4 hours but if start needing it regularly call for immediate appointment  Please see patient coordinator before you leave today  to schedule primary care for follow up of high blood pressure    Pulmonary follow up is as needed

## 2016-07-03 NOTE — Progress Notes (Signed)
Subjective:    Patient ID: Denise Simmons, female    DOB: 1951-02-23,     MRN: [id number]  HPI   Brief patient profile:  82 yobf active smoker with   refractory upper airway symptoms on ACEI when first seen in pulmonary clinic in 2012 but proved to have very minimal airflow obst by pft's 08/07/2011 and did not meet the criteria for copd as recently as 07/03/2016 so has been followed prn with most of the effort in pulmonary clinic directed toward smoking cessation    History of Present Illness  06/19/2011 1st pulmonary cc worse sob  since Oct 30th 2012 when moved to [place] cc sorethroat, trouble swallowing s/p dilation on ACEI cough severe, mostly daytime and dry, comes and goes,  right now better,  Sometimes vomiting. mutiple nebs and inhalers not really helping but the cough med helps the most with her breathing which is better as long as cough is controlled Has RA but  States ok control. rec Try aciphex  20mg   (or Nexium) Take 30-60 min before first meal of the day and Pepcid 20 mg one bedtime  Stop lisinopril and start bystolic 5 mg one in it's place Stop smoking before it stops it  Only use the nebulizer for your breathing if you need it   05/04/2012 f/u ov/Denise Simmons cc breathing better, now being followed by Denise Simmons for "allergies" but still smoking.  rec The key is to stop smoking completely before smoking completely stops you - this is the most important aspect of your care! Pulmonary follow up is as needed     07/03/2016   Pulmonary office visit/ new pt eval as last seen > 4 y prior to [place]  / Denise Simmons / maint on singulair/zyrtec "not helping"  Chief Complaint  Patient presents with  . Pulm Consult    For sinus congestion, sore throat since Feb. 2017. Occasional episodes of SOB and chest pain.   started needing to inhalers /nebs 2014 but no change in last year avg use sev times a week / hfa only  Avg 2 x a month wakes up with some cough/choking rx by [facility] with  sensation of pnds constant day > noct maint on nexium bid 45 min ac and pepcid / has throat stretched every 8 m Doe = limited more by knees than sob  Has seen ENT in [place] for persistent nasal congestion and sore throat x 11 month/ Denise Denise Simmons records requested  Feels some better but not sure what she's taken or whether it helps Convinced multiple environmental triggers are causing these symptoms but can't be the cigarettes    No obvious day to day or daytime variability or assoc excess/ purulent sputum or mucus plugs or hemoptysis or cp or chest tightness, subjective wheeze or overt  hb symptoms. No unusual exp hx or h/o childhood pna/ asthma or knowledge of premature birth.   Also denies any obvious fluctuation of symptoms with weather or environmental changes or other aggravating or alleviating factors except as outlined above   Current Medications, Allergies, Complete Past Medical History, Past Surgical History, Family History, and Social History were reviewed in [employer] record.           Review of Systems  Constitutional: Positive for unexpected weight change. Negative for fever.  HENT: Positive for congestion, postnasal drip, rhinorrhea, sinus pressure, sneezing, sore throat and trouble swallowing. Negative for dental problem, ear pain and nosebleeds.   Eyes: Positive for  pain. Negative for redness and itching.  Respiratory: Positive for cough, chest tightness and shortness of breath. Negative for wheezing.   Cardiovascular: Negative for palpitations and leg swelling.  Gastrointestinal: Negative for nausea and vomiting.  Genitourinary: Negative for dysuria.  Musculoskeletal: Positive for joint swelling.  Skin: Positive for rash.  Neurological: Positive for headaches.  Hematological: Does not bruise/bleed easily.  Psychiatric/Behavioral: Negative for dysphoric mood. The patient is not nervous/anxious.        Objective:   Physical Exam  amb  bf > stated age nad  Wt Readings from Last 3 Encounters:  07/03/16 159 lb 12.8 oz (72.5 kg)  03/10/16 159 lb (72.1 kg)  10/31/14 174 lb (78.9 kg)    Vital signs reviewed    HEENT: nl   turbinates, and oropharynx. Nl external ear canals without cough reflex - edentulous    NECK :  without JVD/Nodes/TM/ nl carotid upstrokes bilaterally   LUNGS: no acc muscle use,  Nl contour chest which is clear to A and P bilaterally without cough on insp or exp maneuvers   CV:  RRR  no s3 or murmur or increase in P2, nad no edema   ABD:  soft and nontender with nl inspiratory excursion in the supine position. No bruits or organomegaly appreciated, bowel sounds nl  MS:  Nl gait/ ext warm without deformities, calf tenderness, cyanosis or clubbing No obvious joint restrictions   SKIN: warm and dry without lesions    NEURO:  alert, somewhat of a childlike affect with extremely short attention span and very tangential thought process/  with  no motor or cerebellar deficits apparent.       I personally reviewed images and agree with radiology impression as follows:  CXR:   03/19/16  No radiographic evidence of acute injury.     Assessment & Plan:

## 2016-07-04 DIAGNOSIS — R058 Other specified cough: Secondary | ICD-10-CM | POA: Insufficient documentation

## 2016-07-04 DIAGNOSIS — R05 Cough: Secondary | ICD-10-CM | POA: Insufficient documentation

## 2016-07-04 NOTE — Assessment & Plan Note (Signed)
-    off ACEI 06/19/2011  > much better 08/07/2011  -  PFT's  08/07/2011  with min airflow obst but DLCO 60% corrects to 86% ? RA related - Spirometry 07/03/2016  FEV1 2.25 (102%)  Ratio 73 with mild curvature off all rx    Despite ongoing smoking (see separate a/p) she has very little evidence of copd and is doing fine on just the prn saba with most of her symptoms today as in past evals entirely explained by upper airway cough syndrome (see separate a/p)   No pulmonary f/u needed/ focus on smoking cessation

## 2016-07-04 NOTE — Assessment & Plan Note (Signed)
Upper airway cough syndrome (previously labeled PNDS) , is  so named because it's frequently impossible to sort out how much is  CR/sinusitis with freq throat clearing (which can be related to primary GERD)   vs  causing  secondary (" extra esophageal")  GERD from wide swings in gastric pressure that occur with throat clearing, often  promoting self use of mint and menthol lozenges that reduce the lower esophageal sphincter tone and exacerbate the problem further in a cyclical fashion.   These are the same pts (now being labeled as having "irritable larynx syndrome" by some cough centers) who not infrequently have a history of having failed to tolerate ace inhibitors,  dry powder inhalers or biphosphonates or report having atypical/extraesophageal reflux symptoms that don't respond to standard doses of PPI  and are easily confused as having aecopd or asthma flares by even experienced allergists/ pulmonologists (myself included).    She needs more attention to diet and continue gerd rx/ stop smoking and f/u with ent as nothing else to offer here.  Could consider trial of gabapentin if ENT eval complete and unfruitful, which I suspect will be the case but have not been able to review so really can't comment further (records requested)   Total time devoted to counseling  > 50 % of 48 m new pt  office visit:  review case with pt/ discussion of options/alternatives/ personally creating written customized instructions  in presence of pt  then going over those specific  Instructions directly with the pt including how to use all of the meds but in particular covering each new medication in detail and the difference between the maintenance/automatic meds and the prns using an action plan format for the latter.  Please see AVS from this visit for a full list of these instructions which I personally wrote for this pt and  are unique to this visit.

## 2016-07-04 NOTE — Assessment & Plan Note (Signed)

## 2016-07-04 NOTE — Assessment & Plan Note (Signed)
Try off acei starting 06/20/2011 due to cough> resolved 08/07/2011   Requesting referral to IM for PC > referred to [facility]

## 2016-07-08 ENCOUNTER — Ambulatory Visit (INDEPENDENT_AMBULATORY_CARE_PROVIDER_SITE_OTHER): Payer: Medicare Other | Admitting: Neurology

## 2016-07-08 ENCOUNTER — Encounter: Payer: Self-pay | Admitting: Neurology

## 2016-07-08 ENCOUNTER — Other Ambulatory Visit: Payer: Self-pay | Admitting: *Deleted

## 2016-07-08 VITALS — BP 132/72 | HR 60 | Ht 66.5 in | Wt 157.2 lb

## 2016-07-08 DIAGNOSIS — R519 Headache, unspecified: Secondary | ICD-10-CM | POA: Insufficient documentation

## 2016-07-08 DIAGNOSIS — G8929 Other chronic pain: Secondary | ICD-10-CM

## 2016-07-08 DIAGNOSIS — M25532 Pain in left wrist: Secondary | ICD-10-CM

## 2016-07-08 DIAGNOSIS — R51 Headache: Secondary | ICD-10-CM

## 2016-07-08 MED ORDER — TOPIRAMATE 50 MG PO TABS: 100.0000 mg | ORAL_TABLET | Freq: Two times a day (BID) | ORAL | 11 refills | Status: DC
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
## 2016-07-09 ENCOUNTER — Other Ambulatory Visit: Payer: Self-pay | Admitting: *Deleted

## 2016-07-09 MED ORDER — TOPIRAMATE 100 MG PO TABS: 100.0000 mg | ORAL_TABLET | Freq: Two times a day (BID) | ORAL | 5 refills | Status: DC

## 2016-07-30 ENCOUNTER — Ambulatory Visit: Payer: Medicare Other | Admitting: Nurse Practitioner

## 2016-08-28 ENCOUNTER — Encounter: Payer: Medicaid Other | Admitting: Neurology

## 2016-10-02 ENCOUNTER — Encounter (HOSPITAL_COMMUNITY): Payer: Self-pay | Admitting: Emergency Medicine

## 2016-10-02 ENCOUNTER — Emergency Department (HOSPITAL_COMMUNITY): Payer: Medicare Other

## 2016-10-02 ENCOUNTER — Emergency Department (HOSPITAL_COMMUNITY)
Admission: EM | Admit: 2016-10-02 | Discharge: 2016-10-02 | Disposition: A | Discharge status: Home / Self Care | Payer: Medicare Other | Attending: Emergency Medicine | Admitting: Emergency Medicine

## 2016-10-02 DIAGNOSIS — R51 Headache: Secondary | ICD-10-CM | POA: Diagnosis present

## 2016-10-02 DIAGNOSIS — Z79899 Other long term (current) drug therapy: Secondary | ICD-10-CM | POA: Insufficient documentation

## 2016-10-02 DIAGNOSIS — J069 Acute upper respiratory infection, unspecified: Secondary | ICD-10-CM

## 2016-10-02 DIAGNOSIS — Z91040 Latex allergy status: Secondary | ICD-10-CM | POA: Insufficient documentation

## 2016-10-02 DIAGNOSIS — I10 Essential (primary) hypertension: Secondary | ICD-10-CM | POA: Insufficient documentation

## 2016-10-02 DIAGNOSIS — F17210 Nicotine dependence, cigarettes, uncomplicated: Secondary | ICD-10-CM | POA: Insufficient documentation

## 2016-10-02 DIAGNOSIS — B9789 Other viral agents as the cause of diseases classified elsewhere: Secondary | ICD-10-CM

## 2016-10-02 DIAGNOSIS — J449 Chronic obstructive pulmonary disease, unspecified: Secondary | ICD-10-CM | POA: Diagnosis not present

## 2016-10-02 LAB — INFLUENZA PANEL BY PCR (TYPE A & B)
Influenza A By PCR: NEGATIVE
Influenza B By PCR: NEGATIVE

## 2016-10-02 LAB — CBC WITH DIFFERENTIAL/PLATELET
Basophils Absolute: 0 10*3/uL (ref 0.0–0.1)
Basophils Relative: 0 %
Eosinophils Absolute: 0.2 10*3/uL (ref 0.0–0.7)
Eosinophils Relative: 3 %
HCT: 39.3 % (ref 36.0–46.0)
Hemoglobin: 13 g/dL (ref 12.0–15.0)
Lymphocytes Relative: 37 %
Lymphs Abs: 1.9 10*3/uL (ref 0.7–4.0)
MCH: 27.3 pg (ref 26.0–34.0)
MCHC: 33.1 g/dL (ref 30.0–36.0)
MCV: 82.4 fL (ref 78.0–100.0)
Monocytes Absolute: 0.8 10*3/uL (ref 0.1–1.0)
Monocytes Relative: 15 %
Neutro Abs: 2.2 10*3/uL (ref 1.7–7.7)
Neutrophils Relative %: 45 %
Platelets: 130 10*3/uL — ABNORMAL LOW (ref 150–400)
RBC: 4.77 MIL/uL (ref 3.87–5.11)
RDW: 13.8 % (ref 11.5–15.5)
WBC: 5 10*3/uL (ref 4.0–10.5)

## 2016-10-02 LAB — I-STAT CHEM 8, ED
BUN: 4 mg/dL — ABNORMAL LOW (ref 6–20)
Calcium, Ion: 1.24 mmol/L (ref 1.15–1.40)
Chloride: 109 mmol/L (ref 101–111)
Creatinine, Ser: 0.7 mg/dL (ref 0.44–1.00)
Glucose, Bld: 93 mg/dL (ref 65–99)
HCT: 39 % (ref 36.0–46.0)
Hemoglobin: 13.3 g/dL (ref 12.0–15.0)
Potassium: 3.6 mmol/L (ref 3.5–5.1)
Sodium: 141 mmol/L (ref 135–145)
TCO2: 23 mmol/L (ref 0–100)

## 2016-10-02 LAB — RAPID STREP SCREEN (MED CTR MEBANE ONLY): Streptococcus, Group A Screen (Direct): NEGATIVE

## 2016-10-02 MED ORDER — BENZONATATE 100 MG PO CAPS: 100.0000 mg | ORAL_CAPSULE | Freq: Three times a day (TID) | ORAL | 0 refills | Status: DC

## 2016-10-02 MED ORDER — SALINE SPRAY 0.65 % NA SOLN
1.0000 | NASAL | 0 refills | Status: DC | PRN
Start: 2016-10-02 — End: 2022-12-22

## 2016-10-02 MED ORDER — SODIUM CHLORIDE 0.9 % IV BOLUS (SEPSIS)
1000.0000 mL | Freq: Once | INTRAVENOUS | Status: AC
  Administered 2016-10-02: 1000 mL via INTRAVENOUS

## 2016-10-02 NOTE — ED Notes (Signed)
Patient transported to X-ray 

## 2016-10-02 NOTE — Discharge Instructions (Signed)
Rest, drink plenty of fluids. Take Tylenol for fever and headache. Do not take extra Tylenol if you're taking your pain medication, because it may contain Tylenol. Intranasal saline for congestion. Salt water gargles for sore throat. You can also try Chloraseptic throat spray. Take Tessalon as prescribed as needed for cough. Follow-up with family doctor in 2 days.

## 2016-10-02 NOTE — ED Triage Notes (Signed)
Pt sts URI sx and sore throat with HA x 2 days

## 2016-10-02 NOTE — ED Provider Notes (Signed)
[place] DEPT Provider Note   CSN: [id number] Arrival date & time: 10/02/16  1516     History   Chief Complaint Chief Complaint  Patient presents with  . URI  . Headache    HPI Denise Simmons is a 66 y.o. female.  HPI Denise Simmons is a 66 y.o. female with a history of chronic pain, rheumatoid arthritis, COPD, hypertension, presents to emergency department complaining of flulike symptoms. Patient reports chills, headache, sore throat, nasal congestion, cough for approximately 2 days. She states symptoms got worse last night. She did not check her temperature so not sure if she was running a fever. She is not taking any medications for this. Patient reports loss of energy, no appetite, associated nausea. Denies vomiting or diarrhea. Denies chest pain or abdominal pain. Denies neck pain or stiffness. Denies any photophobia. No recent sick contacts. Did not get her flu shot this year.  Past Medical History:  Diagnosis Date  . Allergic rhinitis   . Anxiety and depression   . Arthritis    rheumatoid  . Carpal tunnel syndrome   . COPD (chronic obstructive pulmonary disease) ([place])   . Fibromyalgia   . Hyperlipemia   . Hypertension   . Insomnia   . Migraine   . Neck pain   . Raynaud's disease   . Wrist pain, left     Patient Active Problem List   Diagnosis Date Noted  . Left wrist pain 07/08/2016  . Headache 07/08/2016  . Upper airway cough syndrome 07/04/2016  . Rheumatoid arthritis ([place]) 08/07/2011  . Cigarette smoker 08/07/2011  . Essential hypertension 06/20/2011  . COPD GOLD 0 still smoking 06/19/2011    Past Surgical History:  Procedure Laterality Date  . ABDOMINAL HYSTERECTOMY    . BREAST BIOPSY    . HEMORROIDECTOMY    . LUMBAR LAMINECTOMY/DECOMPRESSION MICRODISCECTOMY  10/09/2011   Procedure: LUMBAR LAMINECTOMY/DECOMPRESSION MICRODISCECTOMY 1 LEVEL;  Surgeon: Erline Levine, MD;  Location: [place] [facility];  Service: Neurosurgery;  Laterality:  Right;  RIGHT Lumbar four-five foraminotomy with possible microdiskectomy  . TUBAL LIGATION      OB History    No data available       Home Medications    Prior to Admission medications   Medication Sig Start Date End Date Taking? Authorizing Provider  adalimumab (HUMIRA PEN) 40 MG/0.8ML injection Inject 40 mg into the skin every 14 (fourteen) days.    Historical Provider, MD  albuterol (PROVENTIL HFA;VENTOLIN HFA) 108 (90 BASE) MCG/ACT inhaler Inhale 2 puffs into the lungs 3 (three) times daily as needed. For shortness of breath     Historical Provider, MD  albuterol (PROVENTIL) (2.5 MG/3ML) 0.083% nebulizer solution Take 2.5 mg by nebulization every 6 (six) hours as needed for wheezing or shortness of breath.    Historical Provider, MD  amitriptyline (ELAVIL) 150 MG tablet Take 150 mg by mouth at bedtime.      Historical Provider, MD  benzocaine-menthol (CHLORAEPTIC) 6-10 MG lozenge Take 1 lozenge by mouth as needed for sore throat. 01/25/16   [name], PA-C  bisacodyl (DULCOLAX) 5 MG EC tablet Take 5 mg by mouth daily as needed for moderate constipation.    Historical Provider, MD  bisoprolol (ZEBETA) 5 MG tablet Take 5 mg by mouth daily.    Tanda Rockers, MD  butalbital-acetaminophen-caffeine [facility]) [phone] MG tablet Take 1-2 tablets by mouth every 6 (six) hours as needed for headache. 01/25/16 01/24/17  [name], PA-C  cetirizine (ZYRTEC)  10 MG tablet Take 10 mg by mouth daily.    Historical Provider, MD  cyclobenzaprine (FLEXERIL) 10 MG tablet Take 1 tablet (10 mg total) by mouth 2 (two) times daily as needed for muscle spasms. 03/10/16   Domenic Moras, PA-C  diazepam (VALIUM) 5 MG tablet Take 5 mg by mouth daily as needed for anxiety or sleep.    Historical Provider, MD  Emollient (CERAVE) CREA Apply 1 application topically 2 (two) times daily as needed (break outs).     Historical Provider, MD  esomeprazole (NEXIUM) 40 MG capsule Take 40 mg by mouth 2 (two) times daily.       Historical Provider, MD  famotidine (PEPCID) 20 MG tablet Take 20 mg by mouth at bedtime. With nexium    Historical Provider, MD  montelukast (SINGULAIR) 10 MG tablet Take 10 mg by mouth at bedtime.      Historical Provider, MD  Olopatadine HCl (PATADAY) 0.2 % SOLN Place 1 drop into both eyes 2 (two) times daily.     Historical Provider, MD  oxyCODONE-acetaminophen (PERCOCET) 10-325 MG per tablet Take 0.5 tablets by mouth every 6 (six) hours as needed. For pain 05/12/12   [name], MD  topiramate (TOPAMAX) 100 MG tablet Take 1 tablet (100 mg total) by mouth 2 (two) times daily. 07/09/16   Marcial Pacas, MD  triamcinolone cream (KENALOG) 0.1 % Apply topically 3 (three) times daily. 08/22/12   [name], MD  zolpidem (AMBIEN) 10 MG tablet Take 5-10 mg by mouth at bedtime as needed for sleep.     Historical Provider, MD    Family History Family History  Problem Relation Age of Onset  . Rheum arthritis Mother   . Breast cancer Mother   . Alzheimer's disease Mother   . Heart disease Mother   . Hypertension Mother   . Breast cancer Sister   . Ovarian cancer Maternal Grandmother   . Diabetes Maternal Grandmother   . Rectal cancer Paternal Grandmother   . Cirrhosis Father   . Prostate cancer Brother   . Pancreatic cancer Sister     Social History Social History  Substance Use Topics  . Smoking status: Current Every Day Smoker    Packs/day: 1.50    Years: 25.00    Types: Cigarettes  . Smokeless tobacco: Never Used  . Alcohol use No     Comment: Quit in 2001     Allergies   Celebrex [celecoxib]; Doxycycline; Fentanyl; Lisinopril; Lyrica [pregabalin]; Naproxen; Nsaids; Prednisone; Sumatriptan; Estradiol; Fish allergy; Methotrexate derivatives; Latex; Penicillins; and Sulfa antibiotics   Review of Systems Review of Systems  Constitutional: Positive for chills.  HENT: Positive for congestion and sore throat. Negative for trouble swallowing and voice change.   Respiratory:  Negative for cough, chest tightness and shortness of breath.   Cardiovascular: Negative for chest pain, palpitations and leg swelling.  Gastrointestinal: Positive for nausea. Negative for abdominal pain, diarrhea and vomiting.  Genitourinary: Negative for dysuria, flank pain and pelvic pain.  Musculoskeletal: Negative for arthralgias, myalgias, neck pain and neck stiffness.  Skin: Negative for rash.  Neurological: Positive for headaches. Negative for dizziness and weakness.  All other systems reviewed and are negative.    Physical Exam Updated Vital Signs BP 135/66 (BP Location: Left Arm)   Pulse (!) 51   Temp 99.5 F (37.5 C) (Oral)   Resp 12   Ht 5\' 7"  (1.702 m)   Wt 70.3 kg   SpO2 100%   BMI 24.28 kg/m  Physical Exam  Constitutional: She is oriented to person, place, and time. She appears well-developed and well-nourished. No distress.  HENT:  Head: Normocephalic.  Right Ear: External ear normal.  Left Ear: External ear normal.  Clear rhinorrhea. Oropharynx is erythematous. Left tonsil is slightly larger than right, however uvula is midline. No exudate. TMs and ear canals normal bilaterally.  Eyes: Conjunctivae are normal.  Neck: Normal range of motion. Neck supple.  Cardiovascular: Normal rate, regular rhythm and normal heart sounds.   Pulmonary/Chest: Effort normal and breath sounds normal. No respiratory distress. She has no wheezes. She has no rales.  Abdominal: Soft. Bowel sounds are normal. She exhibits no distension. There is no tenderness. There is no rebound.  Musculoskeletal: She exhibits no edema.  Neurological: She is alert and oriented to person, place, and time.  Skin: Skin is warm and dry.  Psychiatric: She has a normal mood and affect. Her behavior is normal.  Nursing note and vitals reviewed.    ED Treatments / Results  Labs (all labs ordered are listed, but only abnormal results are displayed) Labs Reviewed  CBC WITH DIFFERENTIAL/PLATELET -  Abnormal; Notable for the following:       Result Value   Platelets 130 (*)    All other components within normal limits  I-STAT CHEM 8, ED - Abnormal; Notable for the following:    BUN 4 (*)    All other components within normal limits  RAPID STREP SCREEN (NOT AT [facility])  CULTURE, GROUP A STREP [facility])  INFLUENZA PANEL BY PCR (TYPE A & B)    EKG  EKG Interpretation None       Radiology Dg Chest 2 View  Result Date: 10/02/2016 CLINICAL DATA:  Cough and shortness of breath EXAM: CHEST  2 VIEW COMPARISON:  Chest radiograph 03/19/2016 FINDINGS: The heart size and mediastinal contours are within normal limits. Both lungs are clear. The visualized skeletal structures are unremarkable. IMPRESSION: No active cardiopulmonary disease. Electronically Signed   By: Ulyses Jarred M.D.   On: 10/02/2016 17:46    Procedures Procedures (including critical care time)  Medications Ordered in ED Medications  sodium chloride 0.9 % bolus 1,000 mL (not administered)     Initial Impression / Assessment and Plan / ED Course  I have reviewed the triage vital signs and the nursing notes.  Pertinent labs & imaging results that were available during my care of the patient were reviewed by me and considered in my medical decision making (see chart for details).    Patient in emergency department with URI symptoms onset 2 days ago, worsened last night. On exam, no concern for meningitis, no concern for peritonsillar or retropharyngeal abscess, patient swallowing normally. Her temperature is 100. Vital signs otherwise normal. She is not tachycardic or hypoxic. She is not hypertensive. Will get basic labs, influenza panel, rapid strep, chest x-ray.   7:51 PM Lab work and imaging unremarkable. Influenza and strep both negative. Vital signs remained normal while in emergency department. Patient received 1 L of normal saline. Her symptoms are most likely due to a viral upper respiratory tract infection. Will  discharge home with symptomatic treatment and return precautions. Instructed her to follow-up with a family doctor in 2 days if not improving.  Vitals:   10/02/16 1521 10/02/16 1705 10/02/16 1806 10/02/16 1918  BP: (!) 146/90 135/66  (!) 151/84  Pulse: (!) 57 (!) 51  65  Resp: 18 12  14   Temp: 99.5 F (37.5 C)  100  F (37.8 C)   TempSrc: Oral  Oral   SpO2: 96% 100%  100%  Weight: 70.3 kg     Height: 5\' 7"  (1.702 m)         Final Clinical Impressions(s) / ED Diagnoses   Final diagnoses:  Viral URI with cough    New Prescriptions New Prescriptions   BENZONATATE (TESSALON) 100 MG CAPSULE    Take 1 capsule (100 mg total) by mouth every 8 (eight) hours.   SODIUM CHLORIDE (OCEAN) 0.65 % SOLN NASAL SPRAY    Place 1 spray into both nostrils as needed for congestion.     Jeannett Senior, PA-C 10/02/16 [place], MD 10/11/16 [phone]

## 2016-10-05 LAB — CULTURE, GROUP A STREP (THRC)

## 2018-01-19 ENCOUNTER — Emergency Department (HOSPITAL_COMMUNITY)
Admission: EM | Admit: 2018-01-19 | Discharge: 2018-01-19 | Disposition: A | Discharge status: Home / Self Care | Payer: Medicare Other | Attending: Emergency Medicine | Admitting: Emergency Medicine

## 2018-01-19 ENCOUNTER — Other Ambulatory Visit: Payer: Self-pay

## 2018-01-19 ENCOUNTER — Encounter (HOSPITAL_COMMUNITY): Payer: Self-pay | Admitting: *Deleted

## 2018-01-19 DIAGNOSIS — R109 Unspecified abdominal pain: Secondary | ICD-10-CM | POA: Insufficient documentation

## 2018-01-19 DIAGNOSIS — Z5321 Procedure and treatment not carried out due to patient leaving prior to being seen by health care provider: Secondary | ICD-10-CM | POA: Diagnosis not present

## 2018-01-19 DIAGNOSIS — R110 Nausea: Secondary | ICD-10-CM | POA: Diagnosis not present

## 2018-01-19 HISTORY — DX: Malignant (primary) neoplasm, unspecified: C80.1

## 2018-01-19 LAB — BASIC METABOLIC PANEL
Anion gap: 9 (ref 5–15)
BUN: <5 mg/dL — ABNORMAL LOW (ref 8–23)
CO2: 26 mmol/L (ref 22–32)
Calcium: 9.3 mg/dL (ref 8.9–10.3)
Chloride: 106 mmol/L (ref 98–111)
Creatinine, Ser: 0.79 mg/dL (ref 0.44–1.00)
GFR calc Af Amer: >60 mL/min (ref >60)
GFR calc non Af Amer: >60 mL/min (ref >60)
Glucose, Bld: 93 mg/dL (ref 70–99)
Potassium: 3.6 mmol/L (ref 3.5–5.1)
Sodium: 141 mmol/L (ref 135–145)

## 2018-01-19 LAB — CBC
HCT: 41.8 % (ref 36.0–46.0)
Hemoglobin: 13.2 g/dL (ref 12.0–15.0)
MCH: 26.4 pg (ref 26.0–34.0)
MCHC: 31.6 g/dL (ref 30.0–36.0)
MCV: 83.6 fL (ref 78.0–100.0)
Platelets: 146 10*3/uL — ABNORMAL LOW (ref 150–400)
RBC: 5 MIL/uL (ref 3.87–5.11)
RDW: 13.3 % (ref 11.5–15.5)
WBC: 5 10*3/uL (ref 4.0–10.5)

## 2018-01-19 NOTE — ED Notes (Signed)
Called for room with no response 

## 2018-01-19 NOTE — ED Notes (Signed)
Pt called for vitals with no response. 

## 2018-01-19 NOTE — ED Triage Notes (Signed)
Pt in c/o left flank pain that started yesterday, denies urinary symptoms, pain is worse with movement, reports nausea as well

## 2018-01-19 NOTE — ED Notes (Signed)
Called for vitals recheck x3 with no response.

## 2018-01-20 NOTE — ED Notes (Signed)
Follow up call made  No answer  01/20/18  0943  s Alexiana Laverdure rn

## 2018-06-07 DIAGNOSIS — F17210 Nicotine dependence, cigarettes, uncomplicated: Secondary | ICD-10-CM | POA: Insufficient documentation

## 2018-06-07 DIAGNOSIS — Z79899 Other long term (current) drug therapy: Secondary | ICD-10-CM | POA: Insufficient documentation

## 2018-06-07 DIAGNOSIS — I10 Essential (primary) hypertension: Secondary | ICD-10-CM | POA: Insufficient documentation

## 2018-06-07 DIAGNOSIS — M25512 Pain in left shoulder: Secondary | ICD-10-CM | POA: Insufficient documentation

## 2018-06-08 ENCOUNTER — Emergency Department (HOSPITAL_COMMUNITY): Payer: Medicare Other

## 2018-06-08 ENCOUNTER — Other Ambulatory Visit: Payer: Self-pay

## 2018-06-08 ENCOUNTER — Encounter (HOSPITAL_COMMUNITY): Payer: Self-pay | Admitting: Emergency Medicine

## 2018-06-08 ENCOUNTER — Emergency Department (HOSPITAL_COMMUNITY)
Admission: EM | Admit: 2018-06-08 | Discharge: 2018-06-08 | Disposition: A | Discharge status: Home / Self Care | Payer: Medicare Other | Attending: Emergency Medicine | Admitting: Emergency Medicine

## 2018-06-08 DIAGNOSIS — M25512 Pain in left shoulder: Secondary | ICD-10-CM | POA: Diagnosis not present

## 2018-06-08 MED ORDER — KETOROLAC TROMETHAMINE 60 MG/2ML IM SOLN
30.0000 mg | Freq: Once | INTRAMUSCULAR | Status: AC
  Administered 2018-06-08: 30 mg via INTRAMUSCULAR
  Filled 2018-06-08: qty 2

## 2018-06-08 MED ORDER — METHOCARBAMOL 500 MG PO TABS: 500.0000 mg | ORAL_TABLET | Freq: Three times a day (TID) | ORAL | 0 refills | Status: DC | PRN

## 2018-06-08 MED ORDER — OXYCODONE-ACETAMINOPHEN 5-325 MG PO TABS
2.0000 | ORAL_TABLET | Freq: Once | ORAL | Status: AC
  Administered 2018-06-08: 2 via ORAL
  Filled 2018-06-08: qty 2

## 2018-06-08 MED ORDER — DIAZEPAM 5 MG PO TABS
5.0000 mg | ORAL_TABLET | Freq: Once | ORAL | Status: AC
  Administered 2018-06-08: 5 mg via ORAL
  Filled 2018-06-08: qty 1

## 2018-06-08 NOTE — ED Triage Notes (Signed)
Pt reports back pain on palpation that started at 2200 tonight without injury.

## 2018-06-08 NOTE — Discharge Instructions (Addendum)
Continue your home pain medications and use Robaxin as needed for management of muscle spasms and persistent discomfort.  Alternate ice and heat to the area 3-4 times per day for 15 to 20 minutes each time.  We encourage frequent stretching and range of motion exercises to prevent adhesive capsulitis/frozen shoulder.  Follow-up with your primary care doctor to ensure that symptoms resolved.

## 2018-06-08 NOTE — ED Provider Notes (Signed)
[place] DEPT Provider Note   CSN: [id number] Arrival date & time: 06/07/18  2345     History   Chief Complaint Chief Complaint  Patient presents with  . Back Pain    HPI Denise Simmons is a 67 y.o. female.  The history is provided by the patient. No language interpreter was used.  Shoulder Pain   This is a new problem. The current episode started 3 to 5 hours ago. The problem occurs constantly. The problem has not changed since onset.The pain is present in the left shoulder. The quality of the pain is described as sharp and aching. The pain is moderate. Associated symptoms include limited range of motion. Pertinent negatives include no numbness and no itching. Exacerbated by: shoulder movement. Treatments tried: 1/2 tablet percocet. The treatment provided no relief. There has been no history of extremity trauma. Family history is significant for rheumatoid arthritis.    Past Medical History:  Diagnosis Date  . Allergic rhinitis   . Anxiety and depression   . Arthritis    rheumatoid  . Cancer ([place])    Thyroid  . Carpal tunnel syndrome   . COPD (chronic obstructive pulmonary disease) ([place])   . Fibromyalgia   . Hyperlipemia   . Hypertension   . Insomnia   . Migraine   . Neck pain   . Raynaud's disease   . Wrist pain, left     Patient Active Problem List   Diagnosis Date Noted  . Left wrist pain 07/08/2016  . Headache 07/08/2016  . Upper airway cough syndrome 07/04/2016  . Rheumatoid arthritis ([place]) 08/07/2011  . Cigarette smoker 08/07/2011  . Essential hypertension 06/20/2011  . COPD GOLD 0 still smoking 06/19/2011    Past Surgical History:  Procedure Laterality Date  . ABDOMINAL HYSTERECTOMY    . BREAST BIOPSY    . HEMORROIDECTOMY    . LUMBAR LAMINECTOMY/DECOMPRESSION MICRODISCECTOMY  10/09/2011   Procedure: LUMBAR LAMINECTOMY/DECOMPRESSION MICRODISCECTOMY 1 LEVEL;  Surgeon: Erline Levine, MD;  Location: [place] [facility];   Service: Neurosurgery;  Laterality: Right;  RIGHT Lumbar four-five foraminotomy with possible microdiskectomy  . TUBAL LIGATION       OB History   None      Home Medications    Prior to Admission medications   Medication Sig Start Date End Date Taking? Authorizing Provider  adalimumab (HUMIRA PEN) 40 MG/0.8ML injection Inject 40 mg into the skin every 14 (fourteen) days.   Yes [provider]  albuterol (PROVENTIL HFA;VENTOLIN HFA) 108 (90 BASE) MCG/ACT inhaler Inhale 2 puffs into the lungs 3 (three) times daily as needed. For shortness of breath    Yes [provider]  albuterol (PROVENTIL) (2.5 MG/3ML) 0.083% nebulizer solution Take 2.5 mg by nebulization every 6 (six) hours as needed for wheezing or shortness of breath.   Yes [provider]  bisoprolol (ZEBETA) 5 MG tablet Take 5 mg by mouth daily.   Yes Tanda Rockers, MD  cetirizine (ZYRTEC) 10 MG tablet Take 10 mg by mouth daily.   Yes [provider]  colchicine 0.6 MG tablet Take 0.6 mg by mouth 2 (two) times daily. 05/17/18  Yes [provider]  diazepam (VALIUM) 5 MG tablet Take 5 mg by mouth daily as needed for anxiety or sleep.   Yes [provider]  Emollient (CERAVE) CREA Apply 1 application topically 2 (two) times daily as needed (break outs).    Yes [provider]  esomeprazole (NEXIUM) 40 MG  capsule Take 40 mg by mouth 2 (two) times daily.     Yes [provider]  famotidine (PEPCID) 20 MG tablet Take 20 mg by mouth at bedtime. With nexium   Yes [provider]  fluconazole (DIFLUCAN) 150 MG tablet Take 150 mg by mouth once a week. 05/17/18  Yes [provider]  levothyroxine (SYNTHROID, LEVOTHROID) 112 MCG tablet Take 112 mcg by mouth daily. 03/04/18  Yes [provider]  lidocaine (XYLOCAINE) 2 % solution Take 5 mLs by mouth daily as needed for mouth pain.  04/18/18  Yes [provider]  montelukast (SINGULAIR) 10  MG tablet Take 10 mg by mouth at bedtime.     Yes [provider]  nystatin (MYCOSTATIN) 100000 UNIT/ML suspension Take 30 mLs by mouth daily as needed (mouth pain).  06/04/17  Yes [provider]  Olopatadine HCl (PATADAY) 0.2 % SOLN Place 1 drop into both eyes 2 (two) times daily.    Yes [provider]  topiramate (TOPAMAX) 100 MG tablet Take 1 tablet (100 mg total) by mouth 2 (two) times daily. 07/09/16  Yes Marcial Pacas, MD  benzocaine-menthol (CHLORAEPTIC) 6-10 MG lozenge Take 1 lozenge by mouth as needed for sore throat. Patient not taking: Reported on 06/08/2018 01/25/16   Sam, Olivia Canter, PA-C  benzonatate (TESSALON) 100 MG capsule Take 1 capsule (100 mg total) by mouth every 8 (eight) hours. Patient not taking: Reported on 06/08/2018 10/02/16   Jeannett Senior, PA-C  cyclobenzaprine (FLEXERIL) 10 MG tablet Take 1 tablet (10 mg total) by mouth 2 (two) times daily as needed for muscle spasms. Patient not taking: Reported on 06/08/2018 03/10/16   Domenic Moras, PA-C  methocarbamol (ROBAXIN) 500 MG tablet Take 1 tablet (500 mg total) by mouth every 8 (eight) hours as needed for muscle spasms. 06/08/18   Antonietta Breach, PA-C  oxyCODONE-acetaminophen (PERCOCET) 10-325 MG per tablet Take 0.5 tablets by mouth every 6 (six) hours as needed. For pain Patient not taking: Reported on 06/08/2018 05/12/12   [name], MD  sodium chloride (OCEAN) 0.65 % SOLN nasal spray Place 1 spray into both nostrils as needed for congestion. Patient not taking: Reported on 06/08/2018 10/02/16   Jeannett Senior, PA-C  triamcinolone cream (KENALOG) 0.1 % Apply topically 3 (three) times daily. Patient not taking: Reported on 06/08/2018 08/22/12   [name], MD    Family History Family History  Problem Relation Age of Onset  . Rheum arthritis Mother   . Breast cancer Mother   . Alzheimer's disease Mother   . Heart disease Mother   . Hypertension Mother   . Breast cancer Sister   .  Ovarian cancer Maternal Grandmother   . Diabetes Maternal Grandmother   . Rectal cancer Paternal Grandmother   . Cirrhosis Father   . Prostate cancer Brother   . Pancreatic cancer Sister     Social History Social History   Tobacco Use  . Smoking status: Current Every Day Smoker    Packs/day: 1.50    Years: 25.00    Pack years: 37.50    Types: Cigarettes  . Smokeless tobacco: Never Used  Substance Use Topics  . Alcohol use: No    Comment: Quit in 2001  . Drug use: No    Comment: quit using maryjuana in 2001     Allergies   Celebrex [celecoxib]; Doxycycline; Fentanyl; Lisinopril; Lyrica [pregabalin]; Naproxen; Nsaids; Prednisone; Sumatriptan; Estradiol; Fish allergy; Methotrexate derivatives; Latex; Penicillins; and Sulfa antibiotics   Review of  Systems Review of Systems  Musculoskeletal: Positive for arthralgias.  Skin: Negative for itching.  Neurological: Negative for numbness.  Ten systems reviewed and are negative for acute change, except as noted in the HPI.    Physical Exam Updated Vital Signs BP (!) 163/101   Pulse (!) 52   Temp 98.6 F (37 C) (Oral)   Resp 15   Ht 5\' 6"  (1.676 m)   Wt 71.6 kg   SpO2 100%   BMI 25.49 kg/m   Physical Exam  Constitutional: She is oriented to person, place, and time. She appears well-developed and well-nourished. No distress.  Appears uncomfortable; in NAD  HENT:  Head: Normocephalic and atraumatic.  Eyes: Conjunctivae and EOM are normal. No scleral icterus.  Neck: Normal range of motion.  Cardiovascular: Normal rate, regular rhythm and intact distal pulses.  Distal radial pulse 2+ in the left upper extremity.  Pulmonary/Chest: Effort normal. No stridor. No respiratory distress. She has no wheezes. She has no rales.  Lungs CTAB. Respirations even and unlabored.  Musculoskeletal:  Decreased range of motion with left shoulder abduction secondary to pain.  Reproducible tenderness to palpation to the posterior left  shoulder.  This extends along the left lateral trapezius.  No bony deformities, step-offs, crepitus to the cervical midline.  Neurological: She is alert and oriented to person, place, and time. She exhibits normal muscle tone. Coordination normal.  GCS 15. Patient moving all extremities. Sensation to light touch intact in the LUE.  Skin: Skin is warm and dry. No rash noted. She is not diaphoretic. No erythema. No pallor.  Psychiatric: She has a normal mood and affect. Her behavior is normal.  Nursing note and vitals reviewed.    ED Treatments / Results  Labs (all labs ordered are listed, but only abnormal results are displayed) Labs Reviewed - No data to display  EKG EKG Interpretation  Date/Time:  Wednesday June 08 2018 01:05:54 EST Ventricular Rate:  46 PR Interval:  150 QRS Duration: 80 QT Interval:  450 QTC Calculation: 393 R Axis:   38 Text Interpretation:  Sinus bradycardia Otherwise normal ECG No significant change since last tracing Confirmed by Pryor Curia [id number]) on 06/08/2018 1:17:42 AM   Radiology Dg Cervical Spine Complete  Result Date: 06/08/2018 CLINICAL DATA:  67 y/o F; left-sided neck pain radiating to the left shoulder. Limited range of motion. EXAM: CERVICAL SPINE - COMPLETE 4+ VIEW COMPARISON:  12/10/2012 CT cervical spine. FINDINGS: C1-C7 visible on the lateral view. Mild reversal of cervical curvature with apex at C5. C3-4 grade 1 anterolisthesis. Moderate discogenic degenerative changes from C4-C7 with loss of intervertebral disc space height and prominent endplate marginal osteophytes. Prominent left upper cervical facet arthropathy. Odontoid process is intact. Normal anterior C1-2 articulation. Surgical clips project over the anterior lower neck. No prevertebral soft tissue thickening. IMPRESSION: 1. No acute fracture or dislocation identified. 2. Moderate cervical spondylosis greatest at C4-C7. Electronically Signed   By: Kristine Garbe M.D.    On: 06/08/2018 04:31   Dg Shoulder Left  Result Date: 06/08/2018 CLINICAL DATA:  67 y/o F; left-sided neck pain radiating to the left shoulder. Limited range of motion. EXAM: LEFT SHOULDER - 2+ VIEW COMPARISON:  Concurrent cervical spine radiographs. FINDINGS: There is no evidence of fracture or dislocation. Subacromial enthesophyte. Glenohumeral joint is well maintained. Surgical clips project over the left lower neck. IMPRESSION: No acute fracture or dislocation identified. Subacromial enthesophyte. Electronically Signed   By: Kristine Garbe M.D.   On: 06/08/2018 04:27  Procedures Procedures (including critical care time)  Medications Ordered in ED Medications  ketorolac (TORADOL) injection 30 mg (30 mg Intramuscular Given 06/08/18 0341)  oxyCODONE-acetaminophen (PERCOCET/ROXICET) 5-325 MG per tablet 2 tablet (2 tablets Oral Given 06/08/18 0339)  diazepam (VALIUM) tablet 5 mg (5 mg Oral Given 06/08/18 0339)     Initial Impression / Assessment and Plan / ED Course  I have reviewed the triage vital signs and the nursing notes.  Pertinent labs & imaging results that were available during my care of the patient were reviewed by me and considered in my medical decision making (see chart for details).     Patient presents to the emergency department for evaluation of acute L shoulder pain. Patient neurovascularly intact on exam. Imaging negative for fracture, dislocation, bony deformity. No swelling, erythema, heat to touch to the affected area; no concern for septic joint. Compartments in the affected extremity are soft.   Given reproducibility on palpation and worsening pain with ROM, symptoms c/w MSK etiology. Plan for supportive management including RICE and NSAIDs; primary care follow up as needed. Return precautions discussed and provided. Patient discharged in stable condition with no unaddressed concerns.   Final Clinical Impressions(s) / ED Diagnoses   Final  diagnoses:  Acute pain of left shoulder    ED Discharge Orders         Ordered    methocarbamol (ROBAXIN) 500 MG tablet  Every 8 hours PRN     06/08/18 0456           Antonietta Breach, PA-C 06/08/18 0503    Ward, Delice Bison, DO 06/08/18 [phone]

## 2018-07-31 ENCOUNTER — Other Ambulatory Visit: Payer: Self-pay | Admitting: Neurology

## 2018-08-02 ENCOUNTER — Other Ambulatory Visit: Payer: Self-pay | Admitting: Neurology

## 2019-03-06 ENCOUNTER — Emergency Department (HOSPITAL_COMMUNITY)
Admission: EM | Admit: 2019-03-06 | Discharge: 2019-03-06 | Disposition: A | Discharge status: Home / Self Care | Payer: Medicare Other | Attending: Emergency Medicine | Admitting: Emergency Medicine

## 2019-03-06 ENCOUNTER — Encounter (HOSPITAL_COMMUNITY): Payer: Self-pay

## 2019-03-06 ENCOUNTER — Other Ambulatory Visit: Payer: Self-pay

## 2019-03-06 DIAGNOSIS — F17210 Nicotine dependence, cigarettes, uncomplicated: Secondary | ICD-10-CM | POA: Insufficient documentation

## 2019-03-06 DIAGNOSIS — M069 Rheumatoid arthritis, unspecified: Secondary | ICD-10-CM | POA: Diagnosis not present

## 2019-03-06 DIAGNOSIS — M542 Cervicalgia: Secondary | ICD-10-CM | POA: Diagnosis not present

## 2019-03-06 DIAGNOSIS — M797 Fibromyalgia: Secondary | ICD-10-CM | POA: Insufficient documentation

## 2019-03-06 DIAGNOSIS — G8929 Other chronic pain: Secondary | ICD-10-CM | POA: Diagnosis not present

## 2019-03-06 DIAGNOSIS — Z79899 Other long term (current) drug therapy: Secondary | ICD-10-CM | POA: Diagnosis not present

## 2019-03-06 DIAGNOSIS — I10 Essential (primary) hypertension: Secondary | ICD-10-CM | POA: Diagnosis not present

## 2019-03-06 DIAGNOSIS — M25511 Pain in right shoulder: Secondary | ICD-10-CM | POA: Diagnosis not present

## 2019-03-06 DIAGNOSIS — Z91040 Latex allergy status: Secondary | ICD-10-CM | POA: Diagnosis not present

## 2019-03-06 DIAGNOSIS — Z880 Allergy status to penicillin: Secondary | ICD-10-CM | POA: Diagnosis not present

## 2019-03-06 NOTE — ED Provider Notes (Signed)
[facility] DEPT Provider Note   CSN: [id number] Arrival date & time: 03/06/19  1252     History   Chief Complaint Chief Complaint  Patient presents with  . Shoulder Pain  . Neck Pain    HPI Delaynee Edquist is a 68 y.o. female with past medical history of osteoarthritis, rheumatoid arthritis, polymyalgia rheumatica, chronic neck and shoulder pain, fibromyalgia, presented to the emergency department complaining of acute on chronic neck and right shoulder pain.  She reports this is been going on for years.  She believes her symptoms been getting worse the past several days and weeks.  She reports she has been unable to sleep due to her pain.  She does see a pain specialist and was seen approximately 2 weeks ago in the office, but reports the pain specialist did not want to inject her shoulder at the time because it appeared "hot and swollen."  At home the patient has been taking Percocet and Valium for her pain.  She reports she has tried lidocaine cream and a variety of other skin analgesics.  She reports none of these giving her any relief.  She reports she takes 2-1/2 to 3 mg of Percocet and up to 5 mg of Valium nightly, and never takes more than this.  She denies any new neurological deficits in her right arm or hand.  She denies any recent falls or trauma.  She reports that she is pending evaluation by a spine doctor and has an appointment at the end of September.     HPI  Past Medical History:  Diagnosis Date  . Allergic rhinitis   . Anxiety and depression   . Arthritis    rheumatoid  . Cancer ([place])    Thyroid  . Carpal tunnel syndrome   . COPD (chronic obstructive pulmonary disease) ([place])   . Fibromyalgia   . Hyperlipemia   . Hypertension   . Insomnia   . Migraine   . Neck pain   . Raynaud's disease   . Wrist pain, left     Patient Active Problem List   Diagnosis Date Noted  . Left wrist pain 07/08/2016  . Headache 07/08/2016  .  Upper airway cough syndrome 07/04/2016  . Rheumatoid arthritis ([place]) 08/07/2011  . Cigarette smoker 08/07/2011  . Essential hypertension 06/20/2011  . COPD GOLD 0 still smoking 06/19/2011    Past Surgical History:  Procedure Laterality Date  . ABDOMINAL HYSTERECTOMY    . BREAST BIOPSY    . HEMORROIDECTOMY    . LUMBAR LAMINECTOMY/DECOMPRESSION MICRODISCECTOMY  10/09/2011   Procedure: LUMBAR LAMINECTOMY/DECOMPRESSION MICRODISCECTOMY 1 LEVEL;  Surgeon: Erline Levine, MD;  Location: [facility];  Service: Neurosurgery;  Laterality: Right;  RIGHT Lumbar four-five foraminotomy with possible microdiskectomy  . TUBAL LIGATION       OB History   No obstetric history on file.      Home Medications    Prior to Admission medications   Medication Sig Start Date End Date Taking? Authorizing Provider  adalimumab (HUMIRA PEN) 40 MG/0.8ML injection Inject 40 mg into the skin every 14 (fourteen) days.    [provider]  albuterol (PROVENTIL HFA;VENTOLIN HFA) 108 (90 BASE) MCG/ACT inhaler Inhale 2 puffs into the lungs 3 (three) times daily as needed. For shortness of breath     [provider]  albuterol (PROVENTIL) (2.5 MG/3ML) 0.083% nebulizer solution Take 2.5 mg by nebulization every 6 (six) hours as needed for wheezing or shortness of breath.  [provider]  benzocaine-menthol (CHLORAEPTIC) 6-10 MG lozenge Take 1 lozenge by mouth as needed for sore throat. Patient not taking: Reported on 06/08/2018 01/25/16   Sam, Olivia Canter, PA-C  benzonatate (TESSALON) 100 MG capsule Take 1 capsule (100 mg total) by mouth every 8 (eight) hours. Patient not taking: Reported on 06/08/2018 10/02/16   Jeannett Senior, PA-C  bisoprolol (ZEBETA) 5 MG tablet Take 5 mg by mouth daily.    Tanda Rockers, MD  cetirizine (ZYRTEC) 10 MG tablet Take 10 mg by mouth daily.    [provider]  colchicine 0.6 MG tablet Take 0.6 mg by mouth 2 (two) times daily. 05/17/18   [provider]  cyclobenzaprine (FLEXERIL) 10 MG tablet Take 1 tablet (10 mg total) by mouth 2 (two) times daily as needed for muscle spasms. Patient not taking: Reported on 06/08/2018 03/10/16   Domenic Moras, PA-C  diazepam (VALIUM) 5 MG tablet Take 5 mg by mouth daily as needed for anxiety or sleep.    [provider]  Emollient (CERAVE) CREA Apply 1 application topically 2 (two) times daily as needed (break outs).     [provider]  esomeprazole (NEXIUM) 40 MG capsule Take 40 mg by mouth 2 (two) times daily.      [provider]  famotidine (PEPCID) 20 MG tablet Take 20 mg by mouth at bedtime. With nexium    [provider]  fluconazole (DIFLUCAN) 150 MG tablet Take 150 mg by mouth once a week. 05/17/18   [provider]  levothyroxine (SYNTHROID, LEVOTHROID) 112 MCG tablet Take 112 mcg by mouth daily. 03/04/18   [provider]  lidocaine (XYLOCAINE) 2 % solution Take 5 mLs by mouth daily as needed for mouth pain.  04/18/18   [provider]  methocarbamol (ROBAXIN) 500 MG tablet Take 1 tablet (500 mg total) by mouth every 8 (eight) hours as needed for muscle spasms. 06/08/18   Antonietta Breach, PA-C  montelukast (SINGULAIR) 10 MG tablet Take 10 mg by mouth at bedtime.      [provider]  nystatin (MYCOSTATIN) 100000 UNIT/ML suspension Take 30 mLs by mouth daily as needed (mouth pain).  06/04/17   [provider]  Olopatadine HCl (PATADAY) 0.2 % SOLN Place 1 drop into both eyes 2 (two) times daily.     [provider]  oxyCODONE-acetaminophen (PERCOCET) 10-325 MG per tablet Take 0.5 tablets by mouth every 6 (six) hours as needed. For pain Patient not taking: Reported on 06/08/2018 05/12/12   [name], MD  sodium chloride (OCEAN) 0.65 % SOLN nasal spray Place 1 spray into both nostrils as needed for congestion. Patient not taking: Reported on 06/08/2018 10/02/16   Jeannett Senior, PA-C  topiramate  (TOPAMAX) 100 MG tablet Take 1 tablet (100 mg total) by mouth 2 (two) times daily. 07/09/16   Marcial Pacas, MD  triamcinolone cream (KENALOG) 0.1 % Apply topically 3 (three) times daily. Patient not taking: Reported on 06/08/2018 08/22/12   [name], MD    Family History Family History  Problem Relation Age of Onset  . Rheum arthritis Mother   . Breast cancer Mother   . Alzheimer's disease Mother   . Heart disease Mother   . Hypertension Mother   . Breast cancer Sister   . Ovarian cancer Maternal Grandmother   . Diabetes Maternal Grandmother   . Rectal cancer Paternal Grandmother   . Cirrhosis Father   . Prostate cancer Brother   .  Pancreatic cancer Sister     Social History Social History   Tobacco Use  . Smoking status: Current Every Day Smoker    Packs/day: 1.50    Years: 25.00    Pack years: 37.50    Types: Cigarettes  . Smokeless tobacco: Never Used  Substance Use Topics  . Alcohol use: No    Comment: Quit in 2001  . Drug use: No    Comment: quit using maryjuana in 2001     Allergies   Celebrex [celecoxib], Doxycycline, Fentanyl, Lisinopril, Lyrica [pregabalin], Naproxen, Nsaids, Prednisone, Sumatriptan, Estradiol, Fish allergy, Methotrexate derivatives, Latex, Penicillins, and Sulfa antibiotics   Review of Systems Review of Systems  Constitutional: Negative for chills and fever.  Cardiovascular: Negative for chest pain and palpitations.  Musculoskeletal: Positive for arthralgias, back pain, joint swelling, myalgias, neck pain and neck stiffness.  Skin: Negative for pallor and rash.  Neurological: Negative for seizures and syncope.  All other systems reviewed and are negative.    Physical Exam Updated Vital Signs BP (!) 150/82 (BP Location: Left Arm)   Pulse (!) 50   Temp 99 F (37.2 C) (Oral)   Resp 16   Wt 71.2 kg   SpO2 99%   BMI 25.34 kg/m   Physical Exam Vitals signs and nursing note reviewed.  Constitutional:      General: She is not  in acute distress.    Appearance: She is well-developed.  HENT:     Head: Normocephalic and atraumatic.  Eyes:     Conjunctiva/sclera: Conjunctivae normal.  Neck:     Musculoskeletal: Neck supple.  Cardiovascular:     Rate and Rhythm: Normal rate and regular rhythm.     Pulses: Normal pulses.  Pulmonary:     Effort: Pulmonary effort is normal. No respiratory distress.  Musculoskeletal:     Comments: Exquisite focal tenderness of the right trapezius and posterior deltoid Limited ROM at the right shoulder (active or passive) due to pain No palpable warmth or swelling of the shoulder joint No crepitus on exam Equal strength in her bilateral hands No spinal midline ttp Rheumatoid nodules in bilateral hands  Skin:    General: Skin is warm and dry.  Neurological:     Mental Status: She is alert and oriented to person, place, and time.     Sensory: No sensory deficit.     Motor: No weakness.      ED Treatments / Results  Labs (all labs ordered are listed, but only abnormal results are displayed) Labs Reviewed - No data to display  EKG None  Radiology No results found.  Procedures Procedures (including critical care time)  Medications Ordered in ED Medications - No data to display   Initial Impression / Assessment and Plan / ED Course  I have reviewed the triage vital signs and the nursing notes.  Pertinent labs & imaging results that were available during my care of the patient were reviewed by me and considered in my medical decision making (see chart for details).   68 year old female presenting with unfortunate case of acute on chronic neck and shoulder pain, right-sided.  She reports the pain is been intractable and can no longer be adequately treated with her Percocet and Valium at home.  She has tried every other kind of analgesic.  Unfortunately she was unable to get local anesthetic or steroid injections performed 2 weeks ago by her pain specialist, out of  concern that there was "swelling" of her shoulder space.  On  my exam I see no evidence of such swelling warmth or erythema.  I have a low suspicion that this is a septic shoulder or joint, she also does not have fever.  Suspect this is chronic musculoskeletal disease and possible shoulder adhesive capsilitis vs. Arthritis pain.  She demonstrates no focal neurological deficits to suggest spinal cord impingement.  Likewise her history and physical exam are highly consistent with acute coronary syndrome or aortic dissection or pulmonary embolism.  She adamantly refuses steroids, reporting that this made her have chest pain and fungal infections in the past.  I explained that there is very little else we can offer her in her armamentarium for her pain, she has already taking opioids and benzos chronically.  She tells me she does not want to take NSAIDs this is interfere with her other medications.  Advised her to try to follow-up again with her pain specialist, as it may be will perform local injections.  She otherwise has an appointment in a month with a spine doctor.  No further work-up is warranted at this time will discharge her.        Final Clinical Impressions(s) / ED Diagnoses   Final diagnoses:  Chronic right shoulder pain  Neck pain, chronic    ED Discharge Orders    None       Wyvonnia Dusky, MD 03/06/19 1330

## 2019-03-06 NOTE — ED Triage Notes (Signed)
Pt states she is having pain in her right shoulder and left neck. Pt states she has hx of arthritis in left neck . Pt states pain x 2 months.

## 2019-06-15 ENCOUNTER — Ambulatory Visit: Payer: Medicare Other

## 2019-06-28 ENCOUNTER — Encounter: Payer: Self-pay | Admitting: Physical Therapy

## 2019-06-28 ENCOUNTER — Other Ambulatory Visit: Payer: Self-pay

## 2019-06-28 ENCOUNTER — Ambulatory Visit: Payer: Medicare Other | Attending: Family Medicine | Admitting: Physical Therapy

## 2019-06-28 DIAGNOSIS — M545 Low back pain, unspecified: Secondary | ICD-10-CM

## 2019-06-28 DIAGNOSIS — R262 Difficulty in walking, not elsewhere classified: Secondary | ICD-10-CM | POA: Diagnosis present

## 2019-06-28 DIAGNOSIS — M5412 Radiculopathy, cervical region: Secondary | ICD-10-CM | POA: Diagnosis present

## 2019-06-28 DIAGNOSIS — M25511 Pain in right shoulder: Secondary | ICD-10-CM | POA: Diagnosis not present

## 2019-06-28 NOTE — Therapy (Signed)
[facility] [address] [place], [place], [address] Phone: [phone]   Fax:  [phone]  Physical Therapy Evaluation  Patient Details  Name: Denise Simmons MRN: [id number] Date of Birth: 1950-12-16 Referring Provider (PT): Macarthur Critchley Date: 06/28/2019  PT End of Session - 06/28/19 1351    Visit Number  1    Number of Visits  16    Date for PT Re-Evaluation  08/28/19    PT Start Time  [id number]    PT Stop Time  1410    PT Time Calculation (min)  55 min    Activity Tolerance  Patient tolerated treatment well    Behavior During Therapy  Anxious       Past Medical History:  Diagnosis Date  . Allergic rhinitis   . Anxiety and depression   . Arthritis    rheumatoid  . Cancer ([place])    Thyroid  . Carpal tunnel syndrome   . COPD (chronic obstructive pulmonary disease) ([place])   . Fibromyalgia   . Hyperlipemia   . Hypertension   . Insomnia   . Migraine   . Neck pain   . Raynaud's disease   . Wrist pain, left     Past Surgical History:  Procedure Laterality Date  . ABDOMINAL HYSTERECTOMY    . BREAST BIOPSY    . HEMORROIDECTOMY    . LUMBAR LAMINECTOMY/DECOMPRESSION MICRODISCECTOMY  10/09/2011   Procedure: LUMBAR LAMINECTOMY/DECOMPRESSION MICRODISCECTOMY 1 LEVEL;  Surgeon: Erline Levine, MD;  Location: [facility];  Service: Neurosurgery;  Laterality: Right;  RIGHT Lumbar four-five foraminotomy with possible microdiskectomy  . TUBAL LIGATION      There were no vitals filed for this visit.   Subjective Assessment - 06/28/19 1325    Subjective  Patient reports a complex history of lumbar surgery in 2014, reports having more issues with her lumbar area, but is referred here for cervical radiculopathy with shoulder pain but has had a MRI that showed a RC tear.  This has been going on for about a year.  She reports that she had severe pain, had an injection that caused migraine and nasuea, it did help  pain some.    Limitations  House hold activities;Lifting;Standing    Patient Stated Goals  drive better, do hair easier, dress easier, have less pain    Currently in Pain?  Yes    Pain Score  6     Pain Location  Neck   hsa neck and back pain with shoulder pain   Pain Orientation  Right;Lower;Upper;Mid;Left    Pain Descriptors / Indicators  Aching;Dull;Stabbing    Pain Type  Acute pain    Pain Radiating Towards  does c/o numbness in the arms and the legs    Pain Onset  More than a month ago    Pain Frequency  Constant    Aggravating Factors   backing up car, dressing, doing hair pain up to 9.5/10    Pain Relieving Factors  takes pain medicine, reports that a heated pool would help reports at best pain a 6/20    Effect of Pain on Daily Activities  difficulty driving, doing hair, cleaning, dressing         [facility] PT Assessment - 06/28/19 0001      Assessment   Medical Diagnosis  neck pain, shoulder pain, LBP    Referring Provider (PT)  Vertell Limber    Onset Date/Surgical Date  05/29/19  Hand Dominance  Right      Precautions   Precautions  None      Balance Screen   Has the patient fallen in the past 6 months  No    Has the patient had a decrease in activity level because of a fear of falling?   No    Is the patient reluctant to leave their home because of a fear of falling?   No      Home Environment   Additional Comments  normally does some housework      Prior Function   Level of Independence  Independent with community mobility with device    Vocation  Retired    Leisure  pool exercise prior to [employer] Comments  fwd head, rounded shoulders, stooped forwad      ROM / Strength   AROM / PROM / Strength  AROM;Strength      AROM   Overall AROM Comments  Cervical ROM decreased 75% for all motions except extension is limited 100% with c/o pain in th eneck, lumbar ROM decreaesd 75% with c/o pain in the back, all right arm motions increase  pain    AROM Assessment Site  Shoulder    Right/Left Shoulder  Right    Right Shoulder Flexion  70 Degrees    Right Shoulder ABduction  60 Degrees    Right Shoulder Internal Rotation  45 Degrees    Right Shoulder External Rotation  30 Degrees      Strength   Overall Strength Comments  right shoulder flexion and abduction 3/5 in the ROM but painful, ER/IR 3+/5 with some pain, hips are very weak and reports pain in the buttocks and the back    Strength Assessment Site  Hip    Right/Left Hip  Right;Left    Right Hip Flexion  3/5    Right Hip ABduction  3/5    Right Hip ADduction  3/5    Left Hip Flexion  3/5    Left Hip ABduction  3/5    Left Hip ADduction  3/5      Palpation   Palpation comment  she is very tight in the neck and the upper trap area, very tender, she is tight in the lumbar parapsinals and the buttocks, she was very gaurded to touch      Ambulation/Gait   Gait Comments  today is walking with a SPC, very slow, stooped posture, limps on the right leg, c/o being unsteady      Standardized Balance Assessment   Standardized Balance Assessment  Timed Up and Go Test      Timed Up and Go Test   Normal TUG (seconds)  33                Objective measurements completed on examination: See above findings.      [facility] Adult PT Treatment/Exercise - 06/28/19 0001      Modalities   Modalities  Electrical Stimulation;Moist Heat      Moist Heat Therapy   Number Minutes Moist Heat  10 Minutes    Moist Heat Location  Cervical;Lumbar Spine      Electrical Stimulation   Electrical Stimulation Location  cervical and lumbar    Electrical Stimulation Action  premod    Electrical Stimulation Parameters  sitting    Electrical Stimulation Goals  Pain             PT Education -  06/28/19 1350    Education Details  HEP for cervical and scapular retraction, shoulder shrugs and gentle upper trap stretch       PT Short Term Goals - 06/28/19 1357      PT SHORT  TERM GOAL #1   Title  independent with initial HEP    Time  2    Period  Weeks    Status  New        PT Long Term Goals - 06/28/19 1358      PT LONG TERM GOAL #1   Title  understand posture and body mechanics    Time  8    Period  Weeks    Status  New      PT LONG TERM GOAL #2   Title  decrease pain 25%    Time  8    Period  Weeks    Status  New      PT LONG TERM GOAL #3   Title  report 25% easier to back up the car    Time  8    Period  Weeks    Status  New      PT LONG TERM GOAL #4   Title  reach her bra in the back with the right hand    Time  8    Period  Weeks    Status  New      PT LONG TERM GOAL #5   Title  increase right shoulder flexion to 120 degrees    Time  8    Period  Weeks    Status  New             Plan - 06/28/19 1352    Clinical Impression Statement  Patient comes in with a complicated medical hx.  She reports a right RC tear diagnosed by MRI, has cervical radiculopathy, has a history of back surgery and reports that she has multiple lumbar disc herniations.  She has very limited cervical and lumbar ROM, very poor shoulder ROM all increase pain.  She reports difficlty with backing up the car, dressing and doing hair, reports that she feel unsteady walking.  TUG time was very slow    Personal Factors and Comorbidities  Comorbidity 3+    Comorbidities  PTSD, anxiety, lumbar surgery, COPD, fibromyalgia, thyroid CA    Stability/Clinical Decision Making  Evolving/Moderate complexity    Clinical Decision Making  Moderate    Rehab Potential  Fair    PT Frequency  2x / week    PT Duration  8 weeks    PT Treatment/Interventions  ADLs/Self Care Home Management;Cryotherapy;Electrical Stimulation;Moist Heat;Traction;Ultrasound;Manual Engineer, site;Therapeutic activities;Therapeutic exercise;Balance training;Neuromuscular re-education;Patient/family education;Dry needling    PT Next Visit Plan  Patient was agitated when she got here due to  getting lost, I looked up her address and she lives much closer to our [employer] site, she may transfer there, would need to try to get her moving to help with her function    Consulted and Agree with Plan of Care  Patient       Patient will benefit from skilled therapeutic intervention in order to improve the following deficits and impairments:  Abnormal gait, Pain, Improper body mechanics, Cardiopulmonary status limiting activity, Increased muscle spasms, Postural dysfunction, Impaired UE functional use, Decreased strength, Decreased range of motion, Decreased balance, Difficulty walking  Visit Diagnosis: Acute pain of right shoulder - Plan: PT plan of care cert/re-cert  Radiculopathy, cervical region - Plan: PT  plan of care cert/re-cert  Acute bilateral low back pain without sciatica - Plan: PT plan of care cert/re-cert  Difficulty in walking, not elsewhere classified - Plan: PT plan of care cert/re-cert     Problem List Patient Active Problem List   Diagnosis Date Noted  . Left wrist pain 07/08/2016  . Headache 07/08/2016  . Upper airway cough syndrome 07/04/2016  . Rheumatoid arthritis ([place]) 08/07/2011  . Cigarette smoker 08/07/2011  . Essential hypertension 06/20/2011  . COPD GOLD 0 still smoking 06/19/2011    Sumner Boast., PT 06/28/2019, 2:00 PM  [address] [place], [place], [address] Phone: [phone]   Fax:  [phone]  Name: Denise Simmons MRN: [id number] Date of Birth: 1951-03-22

## 2019-07-10 ENCOUNTER — Encounter: Payer: Self-pay | Admitting: Physical Therapy

## 2019-07-10 ENCOUNTER — Other Ambulatory Visit: Payer: Self-pay

## 2019-07-10 ENCOUNTER — Ambulatory Visit: Payer: Medicare Other | Attending: Family Medicine | Admitting: Physical Therapy

## 2019-07-10 DIAGNOSIS — M25511 Pain in right shoulder: Secondary | ICD-10-CM | POA: Insufficient documentation

## 2019-07-10 DIAGNOSIS — M5412 Radiculopathy, cervical region: Secondary | ICD-10-CM | POA: Insufficient documentation

## 2019-07-10 DIAGNOSIS — R262 Difficulty in walking, not elsewhere classified: Secondary | ICD-10-CM | POA: Insufficient documentation

## 2019-07-10 DIAGNOSIS — M545 Low back pain, unspecified: Secondary | ICD-10-CM

## 2019-07-10 NOTE — Patient Instructions (Signed)
Over Head Pull: Narrow Grip       On back, knees bent, feet flat, band across thighs, elbows straight but relaxed. Pull hands apart (start). Keeping elbows straight, bring arms up and over head, hands toward floor. Keep pull steady on band. Hold momentarily. Return slowly, keeping pull steady, back to start. Repeat __10_ times. Band color ___yellow___   Side Pull: Double Arm   On back, knees bent, feet flat. Arms perpendicular to body, shoulder level, elbows straight but relaxed. Pull arms out to sides, elbows straight. Resistance band comes across collarbones, hands toward floor. Hold momentarily. Slowly return to starting position. Repeat __10_ times. Band color _yellow____   Shoulder Rotation: Double Arm   On back, knees bent, feet flat, elbows tucked at sides, bent 90, hands palms up. Pull hands apart and down toward floor, keeping elbows near sides. Hold momentarily. Slowly return to starting position. Repeat __10_ times. Band color ___yellow ___

## 2019-07-10 NOTE — Therapy (Signed)
[place], [place], [address] Phone: [phone]   Fax:  [phone]  Physical Therapy Treatment  Patient Details  Name: Denise Simmons MRN: [id number] Date of Birth: [birth year] Referring Provider (PT): Macarthur Critchley Date: 07/10/2019  PT End of Session - 07/10/19 1236    Visit Number  2    Number of Visits  16    Date for PT Re-Evaluation  08/28/19    PT Start Time  1219    PT Stop Time  1257    PT Time Calculation (min)  38 min    Activity Tolerance  Patient tolerated treatment well       Past Medical History:  Diagnosis Date  . Allergic rhinitis   . Anxiety and depression   . Arthritis    rheumatoid  . Cancer ([place])    Thyroid  . Carpal tunnel syndrome   . COPD (chronic obstructive pulmonary disease) ([place])   . Fibromyalgia   . Hyperlipemia   . Hypertension   . Insomnia   . Migraine   . Neck pain   . Raynaud's disease   . Wrist pain, left     Past Surgical History:  Procedure Laterality Date  . ABDOMINAL HYSTERECTOMY    . BREAST BIOPSY    . HEMORROIDECTOMY    . LUMBAR LAMINECTOMY/DECOMPRESSION MICRODISCECTOMY  10/09/2011   Procedure: LUMBAR LAMINECTOMY/DECOMPRESSION MICRODISCECTOMY 1 LEVEL;  Surgeon: Erline Levine, MD;  Location: [facility];  Service: Neurosurgery;  Laterality: Right;  RIGHT Lumbar four-five foraminotomy with possible microdiskectomy  . TUBAL LIGATION      There were no vitals filed for this visit.  Subjective Assessment - 07/10/19 1223    Subjective  I'm just messed up.  My neck is hurting and popping.  I need to leave early and go to bed.    Currently in Pain?  Yes    Pain Score  7     Pain Location  Neck    Pain Orientation  Right    Pain Descriptors / Indicators  Aching    Pain Onset  More than a month ago    Pain Frequency  Constant    Multiple Pain Sites  Yes    Pain Score  8    Pain Location  Shoulder    Pain Orientation  Right    Pain  Descriptors / Indicators  Aching    Pain Type  Chronic pain    Pain Onset  More than a month ago    Pain Frequency  Constant         [facility] Adult PT Treatment/Exercise - 07/10/19 0001      Shoulder Exercises: Supine   Horizontal ABduction  Strengthening;Both;10 reps    Theraband Level (Shoulder Horizontal ABduction)  Level 1 (Yellow)    External Rotation  Strengthening;Both;10 reps    Theraband Level (Shoulder External Rotation)  Level 1 (Yellow)    Flexion  AROM;Both;15 reps      Shoulder Exercises: Seated   Elevation Weight (lbs)  circles, shrugs     Retraction  Strengthening;Both;10 reps    External Rotation  Strengthening;Both;10 reps      Shoulder Exercises: Pulleys   Flexion  3 minutes      Manual Therapy   Manual Therapy  Soft tissue mobilization;Passive ROM;Manual Traction    Manual therapy comments  PROM Rt UE all planes     Soft tissue mobilization  posterior cervicals , upper trap  Passive ROM  gentle     Manual Traction  gentle       Neck Exercises: Stretches   Upper Trapezius Stretch  5 reps;10 seconds    Upper Trapezius Stretch Limitations  small ROM     Other Neck Stretches  rotation x 10 slow     Other Neck Stretches  chin tuck  x 10              PT Education - 07/10/19 1235    Education Details  HEP, pain, symptom mgmt    Person(s) Educated  Patient    Methods  Explanation    Comprehension  Verbalized understanding;Returned demonstration       PT Short Term Goals - 06/28/19 1357      PT SHORT TERM GOAL #1   Title  independent with initial HEP    Time  2    Period  Weeks    Status  New        PT Long Term Goals - 06/28/19 1358      PT LONG TERM GOAL #1   Title  understand posture and body mechanics    Time  8    Period  Weeks    Status  New      PT LONG TERM GOAL #2   Title  decrease pain 25%    Time  8    Period  Weeks    Status  New      PT LONG TERM GOAL #3   Title  report 25% easier to back up the car    Time  8     Period  Weeks    Status  New      PT LONG TERM GOAL #4   Title  reach her bra in the back with the right hand    Time  8    Period  Weeks    Status  New      PT LONG TERM GOAL #5   Title  increase right shoulder flexion to 120 degrees    Time  8    Period  Weeks    Status  New            Plan - 07/10/19 1246    Clinical Impression Statement  Pt able to perform her HEP with cues.  Given yellow T band for light resistance in supine, needed pillows for lumbar.  Progress will be limited but able to do more than expected today.    PT Treatment/Interventions  ADLs/Self Care Home Management;Cryotherapy;Electrical Stimulation;Moist Heat;Traction;Ultrasound;Manual Engineer, site;Therapeutic activities;Therapeutic exercise;Balance training;Neuromuscular re-education;Patient/family education;Dry needling    PT Next Visit Plan  progress AAROM and gentle strengthening    PT Home Exercise Plan  chin tucks, scap retraction, upper trap, supine scapular stab with yellow band    Consulted and Agree with Plan of Care  Patient       Patient will benefit from skilled therapeutic intervention in order to improve the following deficits and impairments:  Abnormal gait, Pain, Improper body mechanics, Cardiopulmonary status limiting activity, Increased muscle spasms, Postural dysfunction, Impaired UE functional use, Decreased strength, Decreased range of motion, Decreased balance, Difficulty walking  Visit Diagnosis: Acute pain of right shoulder  Radiculopathy, cervical region  Acute bilateral low back pain without sciatica  Difficulty in walking, not elsewhere classified     Problem List Patient Active Problem List   Diagnosis Date Noted  . Left wrist pain 07/08/2016  . Headache 07/08/2016  . Upper  airway cough syndrome 07/04/2016  . Rheumatoid arthritis ([place]) 08/07/2011  . Cigarette smoker 08/07/2011  . Essential hypertension 06/20/2011  . COPD GOLD 0 still smoking  06/19/2011    Dejane Scheibe 07/10/2019, 1:04 PM  [facility] [address] [place], [place], [address] Phone: [phone]   Fax:  [phone]  Name: Coyla Lutchman MRN: [id number] Date of Birth: 11-22-1950   Raeford Razor, PT 07/10/19 1:04 PM Phone: [phone] Fax: [phone]

## 2019-07-24 ENCOUNTER — Other Ambulatory Visit: Payer: Self-pay

## 2019-07-24 ENCOUNTER — Encounter: Payer: Self-pay | Admitting: Physical Therapy

## 2019-07-24 ENCOUNTER — Ambulatory Visit: Payer: Medicare Other | Admitting: Physical Therapy

## 2019-07-24 DIAGNOSIS — M25511 Pain in right shoulder: Secondary | ICD-10-CM

## 2019-07-24 DIAGNOSIS — M5412 Radiculopathy, cervical region: Secondary | ICD-10-CM

## 2019-07-24 DIAGNOSIS — R262 Difficulty in walking, not elsewhere classified: Secondary | ICD-10-CM

## 2019-07-24 DIAGNOSIS — M545 Low back pain, unspecified: Secondary | ICD-10-CM

## 2019-07-24 NOTE — Therapy (Signed)
[place], [place], [address] Phone: [phone]   Fax:  [phone]  Physical Therapy Treatment  Patient Details  Name: Denise Simmons MRN: [id number] Date of Birth: [birth year] Referring Provider (PT): Macarthur Critchley Date: 07/24/2019  PT End of Session - 07/24/19 1140    Visit Number  3    Number of Visits  16    Date for PT Re-Evaluation  08/28/19    PT Start Time  [id number]    PT Stop Time  1216    PT Time Calculation (min)  42 min    Activity Tolerance  Patient tolerated treatment well    Behavior During Therapy  [facility] for tasks assessed/performed       Past Medical History:  Diagnosis Date  . Allergic rhinitis   . Anxiety and depression   . Arthritis    rheumatoid  . Cancer ([place])    Thyroid  . Carpal tunnel syndrome   . COPD (chronic obstructive pulmonary disease) ([place])   . Fibromyalgia   . Hyperlipemia   . Hypertension   . Insomnia   . Migraine   . Neck pain   . Raynaud's disease   . Wrist pain, left     Past Surgical History:  Procedure Laterality Date  . ABDOMINAL HYSTERECTOMY    . BREAST BIOPSY    . HEMORROIDECTOMY    . LUMBAR LAMINECTOMY/DECOMPRESSION MICRODISCECTOMY  10/09/2011   Procedure: LUMBAR LAMINECTOMY/DECOMPRESSION MICRODISCECTOMY 1 LEVEL;  Surgeon: Erline Levine, MD;  Location: [facility];  Service: Neurosurgery;  Laterality: Right;  RIGHT Lumbar four-five foraminotomy with possible microdiskectomy  . TUBAL LIGATION      There were no vitals filed for this visit.  Subjective Assessment - 07/24/19 1136    Subjective  The neck is horrible. Sometimes 8/1.  right now I can't turn it too well.    Currently in Pain?  Yes    Pain Score  6     Pain Location  Neck    Pain Orientation  Left;Lateral    Pain Descriptors / Indicators  Aching    Pain Type  Acute pain    Pain Score  7    Pain Location  Shoulder    Pain Orientation  Right    Pain Descriptors /  Indicators  Aching    Pain Type  Chronic pain    Pain Onset  More than a month ago    Pain Frequency  Constant    Aggravating Factors   using it, lifting    Pain Relieving Factors  rest , heat           [facility] Adult PT Treatment/Exercise - 07/24/19 0001      Shoulder Exercises: Supine   Horizontal ABduction  Strengthening;Both;10 reps    Theraband Level (Shoulder Horizontal ABduction)  Level 2 (Red)    External Rotation  Strengthening;Both;10 reps    Theraband Level (Shoulder External Rotation)  Level 2 (Red)    Flexion  AROM;Both;15 reps    Shoulder Flexion Weight (lbs)  cane     Other Supine Exercises  chin tuck, scapular retraction x 10 with cues       Shoulder Exercises: Standing   Extension  Strengthening;Both;15 reps;Theraband    Theraband Level (Shoulder Extension)  Level 3 (Green)    Row  Strengthening;Both;15 reps    Theraband Level (Shoulder Row)  Level 3 (Green)      Shoulder Exercises: ROM/Strengthening   Nustep  5 min L4 UE and LE end of session       Moist Heat Therapy   Number Minutes Moist Heat  10 Minutes    Moist Heat Location  Cervical      Manual Therapy   Soft tissue mobilization  posterior cervicals, suboccipitals     Passive ROM  C spine, Rt UE all planes      Manual Traction  gentle       Neck Exercises: Stretches   Upper Trapezius Stretch  3 reps;30 seconds    Levator Stretch  3 reps;30 seconds    Other Neck Stretches  x 5 each side              PT Education - 07/24/19 1231    Education Details  reinforced HEP, posture, proper muscle activation    Person(s) Educated  Patient    Methods  Explanation;Demonstration    Comprehension  Verbalized understanding;Returned demonstration       PT Short Term Goals - 07/24/19 1232      PT SHORT TERM GOAL #1   Title  independent with initial HEP    Status  On-going        PT Long Term Goals - 07/24/19 1232      PT LONG TERM GOAL #1   Title  understand posture and body mechanics     Status  On-going      PT LONG TERM GOAL #2   Title  decrease pain 25%    Status  On-going      PT LONG TERM GOAL #3   Title  report 25% easier to back up the car    Status  On-going      PT LONG TERM GOAL #4   Title  reach her bra in the back with the right hand    Status  On-going      PT LONG TERM GOAL #5   Title  increase right shoulder flexion to 120 degrees    Status  On-going            Plan - 07/24/19 1205    Clinical Impression Statement  Pt did HEP without obvious pain today.  Rt UE ROM is improved.  Spoke of multiple medical and orthopedic issues that will limit her progress.  She was encouarged to continue light exercises as able and be consistent to avoid surgery.    PT Next Visit Plan  progress AAROM and gentle strengthening    PT Home Exercise Plan  chin tucks, scap retraction, upper trap, supine scapular stab with yellow band/red    Consulted and Agree with Plan of Care  Patient       Patient will benefit from skilled therapeutic intervention in order to improve the following deficits and impairments:  Abnormal gait, Pain, Improper body mechanics, Cardiopulmonary status limiting activity, Increased muscle spasms, Postural dysfunction, Impaired UE functional use, Decreased strength, Decreased range of motion, Decreased balance, Difficulty walking  Visit Diagnosis: Acute pain of right shoulder  Radiculopathy, cervical region  Acute bilateral low back pain without sciatica  Difficulty in walking, not elsewhere classified     Problem List Patient Active Problem List   Diagnosis Date Noted  . Left wrist pain 07/08/2016  . Headache 07/08/2016  . Upper airway cough syndrome 07/04/2016  . Rheumatoid arthritis ([place]) 08/07/2011  . Cigarette smoker 08/07/2011  . Essential hypertension 06/20/2011  . COPD GOLD 0 still smoking 06/19/2011    Denise Simmons 07/24/2019, 12:35 PM  [facility] [address] [place], [place], [address] Phone: [phone]   Fax:  [phone]  Name: Denise Simmons MRN: [id number] Date of Birth: April 17, 1951  Raeford Razor, PT 07/24/19 12:35 PM Phone: [phone] Fax: [phone]

## 2019-07-26 ENCOUNTER — Encounter: Payer: Self-pay | Admitting: Physical Therapy

## 2019-07-26 ENCOUNTER — Ambulatory Visit: Payer: Medicare Other | Admitting: Physical Therapy

## 2019-07-26 ENCOUNTER — Other Ambulatory Visit: Payer: Self-pay

## 2019-07-26 DIAGNOSIS — M5412 Radiculopathy, cervical region: Secondary | ICD-10-CM

## 2019-07-26 DIAGNOSIS — M25511 Pain in right shoulder: Secondary | ICD-10-CM

## 2019-07-26 NOTE — Therapy (Signed)
[place], [place], [address] Phone: [phone]   Fax:  [phone]  Physical Therapy Treatment  Patient Details  Name: Denise Simmons MRN: [id number] Date of Birth: [birth year] Referring Provider (PT): Macarthur Critchley Date: 07/26/2019  PT End of Session - 07/26/19 1142    Visit Number  4    Number of Visits  16    Date for PT Re-Evaluation  08/28/19    PT Start Time  [id number]    PT Stop Time  1220    PT Time Calculation (min)  46 min    Activity Tolerance  Patient tolerated treatment well    Behavior During Therapy  [facility] for tasks assessed/performed       Past Medical History:  Diagnosis Date  . Allergic rhinitis   . Anxiety and depression   . Arthritis    rheumatoid  . Cancer ([place])    Thyroid  . Carpal tunnel syndrome   . COPD (chronic obstructive pulmonary disease) ([place])   . Fibromyalgia   . Hyperlipemia   . Hypertension   . Insomnia   . Migraine   . Neck pain   . Raynaud's disease   . Wrist pain, left     Past Surgical History:  Procedure Laterality Date  . ABDOMINAL HYSTERECTOMY    . BREAST BIOPSY    . HEMORROIDECTOMY    . LUMBAR LAMINECTOMY/DECOMPRESSION MICRODISCECTOMY  10/09/2011   Procedure: LUMBAR LAMINECTOMY/DECOMPRESSION MICRODISCECTOMY 1 LEVEL;  Surgeon: Erline Levine, MD;  Location: [place];  Service: Neurosurgery;  Laterality: Right;  RIGHT Lumbar four-five foraminotomy with possible microdiskectomy  . TUBAL LIGATION      There were no vitals filed for this visit.  Subjective Assessment - 07/26/19 1138    Subjective  Arm is aching, neck is hurting.  Popping is back.  Not rated.    Currently in Pain?  Yes        [facility] Adult PT Treatment/Exercise - 07/26/19 0001      Shoulder Exercises: Supine   Horizontal ABduction  Strengthening;Both;10 reps    Theraband Level (Shoulder Horizontal ABduction)  Level 2 (Red)    External Rotation  Strengthening;Both;10 reps     Theraband Level (Shoulder External Rotation)  Level 2 (Red)    External Rotation Weight (lbs)  cane ER stretch on Rt x 10     Flexion  AROM;Both;15 reps    Shoulder Flexion Weight (lbs)  cane     Diagonals  Strengthening;Both;10 reps    Theraband Level (Shoulder Diagonals)  Level 2 (Red)      Shoulder Exercises: Standing   Extension  Strengthening;Both;15 reps;Theraband    Theraband Level (Shoulder Extension)  Level 3 (Green)    Row  Strengthening;Both;15 reps    Theraband Level (Shoulder Row)  Level 3 (Green)      Shoulder Exercises: ROM/Strengthening   UBE (Upper Arm Bike)  5 min L1 forward       Moist Heat Therapy   Number Minutes Moist Heat  10 Minutes    Moist Heat Location  Cervical      Manual Therapy   Soft tissue mobilization  L upper trap, levator scap, tissue rolling and Tr P work                PT Short Term Goals - 07/24/19 1232      PT SHORT TERM GOAL #1   Title  independent with initial HEP    Status  On-going        PT Long Term Goals - 07/24/19 1232      PT LONG TERM GOAL #1   Title  understand posture and body mechanics    Status  On-going      PT LONG TERM GOAL #2   Title  decrease pain 25%    Status  On-going      PT LONG TERM GOAL #3   Title  report 25% easier to back up the car    Status  On-going      PT LONG TERM GOAL #4   Title  reach her bra in the back with the right hand    Status  On-going      PT LONG TERM GOAL #5   Title  increase right shoulder flexion to 120 degrees    Status  On-going            Plan - 07/26/19 1211    Clinical Impression Statement  Able to get full AROM flexion bilaterally without obvious pain. Needs cues to relax upper traps with standing shoulder exercises.  No reported improvement at this time.    PT Treatment/Interventions  ADLs/Self Care Home Management;Cryotherapy;Electrical Stimulation;Moist Heat;Traction;Ultrasound;Manual Engineer, site;Therapeutic activities;Therapeutic  exercise;Balance training;Neuromuscular re-education;Patient/family education;Dry needling    PT Next Visit Plan  progress AAROM and gentle strengthening, UBE, repeat manual    PT Home Exercise Plan  chin tucks, scap retraction, upper trap, supine scapular stab with yellow band/red    Consulted and Agree with Plan of Care  Patient       Patient will benefit from skilled therapeutic intervention in order to improve the following deficits and impairments:  Abnormal gait, Pain, Improper body mechanics, Cardiopulmonary status limiting activity, Increased muscle spasms, Postural dysfunction, Impaired UE functional use, Decreased strength, Decreased range of motion, Decreased balance, Difficulty walking  Visit Diagnosis: Acute pain of right shoulder  Radiculopathy, cervical region     Problem List Patient Active Problem List   Diagnosis Date Noted  . Left wrist pain 07/08/2016  . Headache 07/08/2016  . Upper airway cough syndrome 07/04/2016  . Rheumatoid arthritis ([place]) 08/07/2011  . Cigarette smoker 08/07/2011  . Essential hypertension 06/20/2011  . COPD GOLD 0 still smoking 06/19/2011    Denise Simmons 07/26/2019, 12:14 PM  [facility] [address] [place], [place], [address] Phone: [phone]   Fax:  [phone]  Name: Denise Simmons MRN: [id number] Date of Birth: 1950/09/25  Raeford Razor, PT 07/26/19 12:14 PM Phone: [phone] Fax: [phone]

## 2019-08-01 ENCOUNTER — Other Ambulatory Visit: Payer: Self-pay

## 2019-08-01 ENCOUNTER — Ambulatory Visit: Payer: Medicare Other | Attending: Family Medicine | Admitting: Physical Therapy

## 2019-08-01 ENCOUNTER — Encounter: Payer: Self-pay | Admitting: Physical Therapy

## 2019-08-01 DIAGNOSIS — M25511 Pain in right shoulder: Secondary | ICD-10-CM

## 2019-08-01 DIAGNOSIS — R262 Difficulty in walking, not elsewhere classified: Secondary | ICD-10-CM

## 2019-08-01 DIAGNOSIS — M5412 Radiculopathy, cervical region: Secondary | ICD-10-CM | POA: Diagnosis present

## 2019-08-01 DIAGNOSIS — M545 Low back pain, unspecified: Secondary | ICD-10-CM

## 2019-08-01 NOTE — Therapy (Signed)
[place], [place], [address] Phone: [phone]   Fax:  [phone]  Physical Therapy Treatment  Patient Details  Name: Denise Simmons MRN: [id number] Date of Birth: 1950-09-24 Referring Provider (PT): Macarthur Critchley Date: 08/01/2019  PT End of Session - 08/01/19 1137    Visit Number  5    Number of Visits  16    Date for PT Re-Evaluation  08/28/19    PT Start Time  1130    Activity Tolerance  Patient limited by pain    Behavior During Therapy  [facility] for tasks assessed/performed       Past Medical History:  Diagnosis Date  . Allergic rhinitis   . Anxiety and depression   . Arthritis    rheumatoid  . Cancer ([place])    Thyroid  . Carpal tunnel syndrome   . COPD (chronic obstructive pulmonary disease) ([place])   . Fibromyalgia   . Hyperlipemia   . Hypertension   . Insomnia   . Migraine   . Neck pain   . Raynaud's disease   . Wrist pain, left     Past Surgical History:  Procedure Laterality Date  . ABDOMINAL HYSTERECTOMY    . BREAST BIOPSY    . HEMORROIDECTOMY    . LUMBAR LAMINECTOMY/DECOMPRESSION MICRODISCECTOMY  10/09/2011   Procedure: LUMBAR LAMINECTOMY/DECOMPRESSION MICRODISCECTOMY 1 LEVEL;  Surgeon: Erline Levine, MD;  Location: [place] [facility];  Service: Neurosurgery;  Laterality: Right;  RIGHT Lumbar four-five foraminotomy with possible microdiskectomy  . TUBAL LIGATION      There were no vitals filed for this visit.  Subjective Assessment - 08/01/19 1133    Subjective  I was in the bed all weekend, sore and stiff. Neck is killing me. It is both my shoulder and my neck.  I think if it werent for COVD id say get it over with.    Currently in Pain?  Yes    Pain Score  8     Pain Location  Neck    Pain Orientation  Left    Pain Descriptors / Indicators  Sore    Pain Type  Acute pain    Pain Onset  More than a month ago    Pain Frequency  Constant    Multiple Pain Sites  Yes    Pain Score  8    Pain Location  Shoulder    Pain Orientation  Right    Pain Descriptors / Indicators  Sharp    Pain Type  Chronic pain    Pain Onset  More than a month ago    Pain Frequency  Constant        [facility] Adult PT Treatment/Exercise - 08/01/19 0001      Neck Exercises: Supine   Neck Retraction  10 reps;5 secs    Cervical Rotation Limitations  AROM x 10 each side     Other Supine Exercise  LTR with head turns x 10       Neck Exercises: Stabilization   Stabilization  chin tuck x 10       Shoulder Exercises: Supine   Horizontal ABduction  Strengthening;Both;15 reps    Flexion  AROM;Both;15 reps    Shoulder Flexion Weight (lbs)  cane     Other Supine Exercises  retraction x 10       Shoulder Exercises: Stretch   Other Shoulder Stretches  upper trunk rotation x 5 each side mod cues  Moist Heat Therapy   Number Minutes Moist Heat  15 Minutes    Moist Heat Location  Cervical      Electrical Stimulation   Electrical Stimulation Location  bilateral posterior cervical and upper back    Electrical Stimulation Action  IFC     Electrical Stimulation Parameters  to tolerance (13.5)     Electrical Stimulation Goals  Pain               PT Short Term Goals - 07/24/19 1232      PT SHORT TERM GOAL #1   Title  independent with initial HEP    Status  On-going        PT Long Term Goals - 07/24/19 1232      PT LONG TERM GOAL #1   Title  understand posture and body mechanics    Status  On-going      PT LONG TERM GOAL #2   Title  decrease pain 25%    Status  On-going      PT LONG TERM GOAL #3   Title  report 25% easier to back up the car    Status  On-going      PT LONG TERM GOAL #4   Title  reach her bra in the back with the right hand    Status  On-going      PT LONG TERM GOAL #5   Title  increase right shoulder flexion to 120 degrees    Status  On-going            Plan - 08/01/19 1158    Clinical Impression Statement  Patient with  increased pain today.  She appears stiff throughout her spine.  She plans to cont for 2-3 more visits before "giving up". Encouraged her to use her home TENS, bring it in if she needs help. Also given info about PNE and that gentle motion is beneficial to her on many levels. Included lumbar and trunk today for more integrated movement.    PT Treatment/Interventions  ADLs/Self Care Home Management;Cryotherapy;Electrical Stimulation;Moist Heat;Traction;Ultrasound;Manual Engineer, site;Therapeutic activities;Therapeutic exercise;Balance training;Neuromuscular re-education;Patient/family education;Dry needling    PT Next Visit Plan  progress AAROM and gentle strengthening, UBE, repeat manual/IFC , check her home TENS    PT Home Exercise Plan  chin tucks, scap retraction, upper trap, supine scapular stab with yellow band/red    Consulted and Agree with Plan of Care  Patient       Patient will benefit from skilled therapeutic intervention in order to improve the following deficits and impairments:  Abnormal gait, Pain, Improper body mechanics, Cardiopulmonary status limiting activity, Increased muscle spasms, Postural dysfunction, Impaired UE functional use, Decreased strength, Decreased range of motion, Decreased balance, Difficulty walking  Visit Diagnosis: Radiculopathy, cervical region  Acute bilateral low back pain without sciatica  Difficulty in walking, not elsewhere classified  Acute pain of right shoulder     Problem List Patient Active Problem List   Diagnosis Date Noted  . Left wrist pain 07/08/2016  . Headache 07/08/2016  . Upper airway cough syndrome 07/04/2016  . Rheumatoid arthritis ([place]) 08/07/2011  . Cigarette smoker 08/07/2011  . Essential hypertension 06/20/2011  . COPD GOLD 0 still smoking 06/19/2011    Denise Simmons 08/01/2019, 12:01 PM  [facility] [address] [place], [place], [address] Phone:  [phone]   Fax:  [phone]  Name: Denise Simmons MRN: [id number] Date of Birth: July 28, 1950  Raeford Razor, PT 08/01/19 12:01 PM  Phone: [phone] Fax: [phone]

## 2019-08-04 ENCOUNTER — Other Ambulatory Visit: Payer: Self-pay

## 2019-08-04 ENCOUNTER — Ambulatory Visit: Payer: Medicare Other | Admitting: Physical Therapy

## 2019-08-04 ENCOUNTER — Encounter: Payer: Self-pay | Admitting: Physical Therapy

## 2019-08-04 DIAGNOSIS — M545 Low back pain, unspecified: Secondary | ICD-10-CM

## 2019-08-04 DIAGNOSIS — M5412 Radiculopathy, cervical region: Secondary | ICD-10-CM

## 2019-08-04 DIAGNOSIS — R262 Difficulty in walking, not elsewhere classified: Secondary | ICD-10-CM

## 2019-08-04 DIAGNOSIS — M25511 Pain in right shoulder: Secondary | ICD-10-CM

## 2019-08-04 NOTE — Therapy (Addendum)
[place], [place], [address] Phone: [phone]   Fax:  [phone]  Physical Therapy Treatment/Discharge  Patient Details  Name: Denise Simmons MRN: [id number] Date of Birth: 1951/05/08 Referring Provider (PT): Macarthur Critchley Date: 08/04/2019  PT End of Session - 08/04/19 1124    Visit Number  6    Number of Visits  16    Date for PT Re-Evaluation  08/28/19    PT Start Time  9702    PT Stop Time  1211    PT Time Calculation (min)  45 min    Activity Tolerance  Patient limited by pain;Patient tolerated treatment well    Behavior During Therapy  [facility] for tasks assessed/performed       Past Medical History:  Diagnosis Date  . Allergic rhinitis   . Anxiety and depression   . Arthritis    rheumatoid  . Cancer ([place])    Thyroid  . Carpal tunnel syndrome   . COPD (chronic obstructive pulmonary disease) ([place])   . Fibromyalgia   . Hyperlipemia   . Hypertension   . Insomnia   . Migraine   . Neck pain   . Raynaud's disease   . Wrist pain, left     Past Surgical History:  Procedure Laterality Date  . ABDOMINAL HYSTERECTOMY    . BREAST BIOPSY    . HEMORROIDECTOMY    . LUMBAR LAMINECTOMY/DECOMPRESSION MICRODISCECTOMY  10/09/2011   Procedure: LUMBAR LAMINECTOMY/DECOMPRESSION MICRODISCECTOMY 1 LEVEL;  Surgeon: Erline Levine, MD;  Location: [facility];  Service: Neurosurgery;  Laterality: Right;  RIGHT Lumbar four-five foraminotomy with possible microdiskectomy  . TUBAL LIGATION      There were no vitals filed for this visit.  Subjective Assessment - 08/04/19 1131    Subjective  Was up half the night and I did some exercises.  that helped a little.  Its a twinge in my tailbone and some pain in my neck.  Did not rate pain.       [facility] PT Treatment/Exercise - 08/04/19 0001      Shoulder Exercises: Standing   Diagonals  Strengthening;Both;10 reps    Theraband Level (Shoulder  Diagonals)  Level 1 (Yellow)    Other Standing Exercises  standing scapular stabilization exercises with yellow band , ball at T spine : narrow grip flexion, horizontal abd and ER/IR about 10-15 reps each     Other Standing Exercises  took out the ball in order to engage lower core better       Shoulder Exercises: ROM/Strengthening   Nustep  UE and LE L7 for 6 min     Wall Pushups  15 reps    Plank Limitations  wall plank added shoulder taps and then hip extension     Modified Plank Limitations  see plank      Manual Therapy   Manual Therapy  Joint mobilization    Joint Mobilization  lateral glides to mid to lower cervicals , less mobile to L     Soft tissue mobilization  suboccipitals    Manual Traction  gentle       Neck Exercises: Stretches   Upper Trapezius Stretch  2 reps;30 seconds    Levator Stretch  2 reps;30 seconds    Lower Cervical/Upper Thoracic Stretch  3 reps;30 seconds               PT Short Term Goals - 07/24/19 1232  PT SHORT TERM GOAL #1   Title  independent with initial HEP    Status  On-going        PT Long Term Goals - 08/04/19 1155      PT LONG TERM GOAL #1   Title  Pt will understand posture as it relates to neck pain and prevention and risk of reinjury.    Time  8    Period  Weeks    Status  Revised      PT LONG TERM GOAL #2   Title  Pt will report decrease pain 25% with normal ADLs.    Time  8    Period  Weeks    Status  On-going      PT LONG TERM GOAL #3   Title  report 25% easier to back up the car      PT LONG TERM GOAL #4   Title  reach her bra in the back with the right hand    Status  Achieved      PT LONG TERM GOAL #5   Title  increase right shoulder flexion to 120 degrees    Status  Achieved            Plan - 08/04/19 1209    Clinical Impression Statement  Patient has increased her RT UE AROM.  She seems to be consistent with HEP.  She does say her neck pain is worsening, although she tolerates exercises  quite well.  She declined IFC today, forgot to bring her TEENS in but maybe she will remember next week.    PT Treatment/Interventions  ADLs/Self Care Home Management;Cryotherapy;Electrical Stimulation;Moist Heat;Traction;Ultrasound;Manual Engineer, site;Therapeutic activities;Therapeutic exercise;Balance training;Neuromuscular re-education;Patient/family education;Dry needling    PT Next Visit Plan  progress AAROM and gentle strengthening, UBE, repeat manual/IFC , check her home TENS    PT Home Exercise Plan  chin tucks, scap retraction, upper trap, supine scapular stab with yellow band/red    Consulted and Agree with Plan of Care  Patient       Patient will benefit from skilled therapeutic intervention in order to improve the following deficits and impairments:  Abnormal gait, Pain, Improper body mechanics, Cardiopulmonary status limiting activity, Increased muscle spasms, Postural dysfunction, Impaired UE functional use, Decreased strength, Decreased range of motion, Decreased balance, Difficulty walking  Visit Diagnosis: Radiculopathy, cervical region  Acute bilateral low back pain without sciatica  Difficulty in walking, not elsewhere classified  Acute pain of right shoulder     Problem List Patient Active Problem List   Diagnosis Date Noted  . Left wrist pain 07/08/2016  . Headache 07/08/2016  . Upper airway cough syndrome 07/04/2016  . Rheumatoid arthritis ([place]) 08/07/2011  . Cigarette smoker 08/07/2011  . Essential hypertension 06/20/2011  . COPD GOLD 0 still smoking 06/19/2011    Trevar Boehringer 08/04/2019, 12:15 PM  [facility] [address] [place], [place], [address] Phone: [phone]   Fax:  [phone]  Name: Denise Simmons MRN: [id number] Date of Birth: [birth year]  Raeford Razor, PT 08/04/19 12:15 PM Phone: [phone] Fax: [phone]   PHYSICAL THERAPY DISCHARGE SUMMARY  Visits from  Start of Care: 6  Current functional level related to goals / functional outcomes: Unknown   Remaining deficits: Unknown, having surgery    Education / Equipment: HEP  Plan: Patient agrees to discharge.  Patient goals were not met. Patient is being discharged due to not returning since the last visit.  ?????     Anderson Malta  Willian Donson, PT 09/04/19 8:25 AM Phone: [phone] Fax: [phone]

## 2019-08-07 ENCOUNTER — Ambulatory Visit: Payer: Medicare Other | Admitting: Physical Therapy

## 2019-08-11 ENCOUNTER — Ambulatory Visit: Payer: Medicare Other | Admitting: Physical Therapy

## 2019-08-25 ENCOUNTER — Encounter: Payer: Medicare Other | Admitting: Physical Therapy

## 2019-08-31 ENCOUNTER — Ambulatory Visit: Payer: Medicare Other | Admitting: Physical Therapy

## 2019-09-04 ENCOUNTER — Ambulatory Visit: Payer: Medicare Other | Admitting: Physical Therapy

## 2019-10-24 ENCOUNTER — Other Ambulatory Visit: Payer: Self-pay

## 2019-10-24 ENCOUNTER — Emergency Department (HOSPITAL_COMMUNITY)
Admission: EM | Admit: 2019-10-24 | Discharge: 2019-10-24 | Disposition: A | Discharge status: Home / Self Care | Payer: Medicare Other | Attending: Emergency Medicine | Admitting: Emergency Medicine

## 2019-10-24 DIAGNOSIS — J449 Chronic obstructive pulmonary disease, unspecified: Secondary | ICD-10-CM | POA: Diagnosis not present

## 2019-10-24 DIAGNOSIS — M797 Fibromyalgia: Secondary | ICD-10-CM | POA: Diagnosis not present

## 2019-10-24 DIAGNOSIS — F17210 Nicotine dependence, cigarettes, uncomplicated: Secondary | ICD-10-CM | POA: Diagnosis not present

## 2019-10-24 DIAGNOSIS — R21 Rash and other nonspecific skin eruption: Secondary | ICD-10-CM

## 2019-10-24 DIAGNOSIS — I10 Essential (primary) hypertension: Secondary | ICD-10-CM | POA: Diagnosis not present

## 2019-10-24 MED ORDER — TRIAMCINOLONE ACETONIDE 0.1 % EX CREA
TOPICAL_CREAM | Freq: Two times a day (BID) | CUTANEOUS | Status: DC
  Administered 2019-10-24: 1 via TOPICAL
  Filled 2019-10-24: qty 15

## 2019-10-24 NOTE — Discharge Instructions (Addendum)
As discussed, today's evaluation of the generally reassuring.  However, it is very portly foot schedule follow-up with your dermatologist for discussion of today's presentation, and your new progression of the rash.  Please use the provided medication twice daily until you have you seen your physician.  Return here for concerning changes in your condition.

## 2019-10-24 NOTE — ED Provider Notes (Signed)
[facility] DEPT Provider Note   CSN: [id number] Arrival date & time: 10/24/19  1625     History Chief Complaint  Patient presents with  . Rash    Denise Simmons is a 69 y.o. female.  HPI   Patient presents with concern of rash. Patient has multiple medical issues, including ongoing cutaneous lesions, but now notes that over the past month or so which she believes to be lichen planus has progressed in spite of using a substantial daily regimen including tacrolimus, steroids, antifungal. Whereas before the lesions were on her extremities, it is recently progressed to include her torso and her genitals.  It is causing substantial discomfort, ADL. As above, she has multiple medical issues, but denies changes in any of them beyond her rash. She has a dermatologist as [facility], has been unable to schedule a visit thus far for this affliction.  Past Medical History:  Diagnosis Date  . Allergic rhinitis   . Anxiety and depression   . Arthritis    rheumatoid  . Cancer ([place])    Thyroid  . Carpal tunnel syndrome   . COPD (chronic obstructive pulmonary disease) ([place])   . Fibromyalgia   . Hyperlipemia   . Hypertension   . Insomnia   . Migraine   . Neck pain   . Raynaud's disease   . Wrist pain, left     Patient Active Problem List   Diagnosis Date Noted  . Left wrist pain 07/08/2016  . Headache 07/08/2016  . Upper airway cough syndrome 07/04/2016  . Rheumatoid arthritis ([place]) 08/07/2011  . Cigarette smoker 08/07/2011  . Essential hypertension 06/20/2011  . COPD GOLD 0 still smoking 06/19/2011    Past Surgical History:  Procedure Laterality Date  . ABDOMINAL HYSTERECTOMY    . BREAST BIOPSY    . HEMORROIDECTOMY    . LUMBAR LAMINECTOMY/DECOMPRESSION MICRODISCECTOMY  10/09/2011   Procedure: LUMBAR LAMINECTOMY/DECOMPRESSION MICRODISCECTOMY 1 LEVEL;  Surgeon: Erline Levine, MD;  Location: [facility];  Service:  Neurosurgery;  Laterality: Right;  RIGHT Lumbar four-five foraminotomy with possible microdiskectomy  . TUBAL LIGATION       OB History   No obstetric history on file.     Family History  Problem Relation Age of Onset  . Rheum arthritis Mother   . Breast cancer Mother   . Alzheimer's disease Mother   . Heart disease Mother   . Hypertension Mother   . Breast cancer Sister   . Ovarian cancer Maternal Grandmother   . Diabetes Maternal Grandmother   . Rectal cancer Paternal Grandmother   . Cirrhosis Father   . Prostate cancer Brother   . Pancreatic cancer Sister     Social History   Tobacco Use  . Smoking status: Current Every Day Smoker    Packs/day: 1.50    Years: 25.00    Pack years: 37.50    Types: Cigarettes  . Smokeless tobacco: Never Used  Substance Use Topics  . Alcohol use: No    Comment: Quit in 2001  . Drug use: No    Comment: quit using maryjuana in 2001    Home Medications Prior to Admission medications   Medication Sig Start Date End Date Taking? Authorizing Provider  adalimumab (HUMIRA PEN) 40 MG/0.8ML injection Inject 40 mg into the skin every 14 (fourteen) days.    [provider]  albuterol (PROVENTIL HFA;VENTOLIN HFA) 108 (90 BASE) MCG/ACT inhaler Inhale 2 puffs into the lungs 3 (three) times  daily as needed. For shortness of breath     [provider]  albuterol (PROVENTIL) (2.5 MG/3ML) 0.083% nebulizer solution Take 2.5 mg by nebulization every 6 (six) hours as needed for wheezing or shortness of breath.    [provider]  benzocaine-menthol (CHLORAEPTIC) 6-10 MG lozenge Take 1 lozenge by mouth as needed for sore throat. Patient not taking: Reported on 06/08/2018 01/25/16   Sam, Olivia Canter, PA-C  benzonatate (TESSALON) 100 MG capsule Take 1 capsule (100 mg total) by mouth every 8 (eight) hours. Patient not taking: Reported on 06/08/2018 10/02/16   Jeannett Senior, PA-C  bisoprolol (ZEBETA) 5 MG tablet Take 5 mg by mouth  daily.    Tanda Rockers, MD  cetirizine (ZYRTEC) 10 MG tablet Take 10 mg by mouth daily.    [provider]  colchicine 0.6 MG tablet Take 0.6 mg by mouth 2 (two) times daily. 05/17/18   [provider]  cyclobenzaprine (FLEXERIL) 10 MG tablet Take 1 tablet (10 mg total) by mouth 2 (two) times daily as needed for muscle spasms. Patient not taking: Reported on 06/08/2018 03/10/16   Domenic Moras, PA-C  diazepam (VALIUM) 5 MG tablet Take 5 mg by mouth daily as needed for anxiety or sleep.    [provider]  Emollient (CERAVE) CREA Apply 1 application topically 2 (two) times daily as needed (break outs).     [provider]  esomeprazole (NEXIUM) 40 MG capsule Take 40 mg by mouth 2 (two) times daily.      [provider]  famotidine (PEPCID) 20 MG tablet Take 20 mg by mouth at bedtime. With nexium    [provider]  fluconazole (DIFLUCAN) 150 MG tablet Take 150 mg by mouth once a week. 05/17/18   [provider]  levothyroxine (SYNTHROID, LEVOTHROID) 112 MCG tablet Take 112 mcg by mouth daily. 03/04/18   [provider]  lidocaine (XYLOCAINE) 2 % solution Take 5 mLs by mouth daily as needed for mouth pain.  04/18/18   [provider]  methocarbamol (ROBAXIN) 500 MG tablet Take 1 tablet (500 mg total) by mouth every 8 (eight) hours as needed for muscle spasms. 06/08/18   Antonietta Breach, PA-C  montelukast (SINGULAIR) 10 MG tablet Take 10 mg by mouth at bedtime.      [provider]  nystatin (MYCOSTATIN) 100000 UNIT/ML suspension Take 30 mLs by mouth daily as needed (mouth pain).  06/04/17   [provider]  Olopatadine HCl (PATADAY) 0.2 % SOLN Place 1 drop into both eyes 2 (two) times daily.     [provider]  oxyCODONE-acetaminophen (PERCOCET) 10-325 MG per tablet Take 0.5 tablets by mouth every 6 (six) hours as needed. For pain Patient not taking: Reported on 06/08/2018 05/12/12   [name], MD  sodium chloride (OCEAN) 0.65 % SOLN nasal spray Place 1 spray into both nostrils as needed for congestion. Patient not taking: Reported on 06/08/2018 10/02/16   Jeannett Senior, PA-C  topiramate (TOPAMAX) 100 MG tablet Take 1 tablet (100 mg total) by mouth 2 (two) times daily. 07/09/16   Marcial Pacas, MD  triamcinolone cream (KENALOG) 0.1 % Apply topically 3 (three) times daily. Patient not taking: Reported on 06/08/2018 08/22/12   [name], MD    Allergies    Celebrex [celecoxib], Doxycycline, Fentanyl, Lisinopril, Lyrica [pregabalin], Naproxen, Nsaids, Prednisone, Sumatriptan, Estradiol, Fish allergy, Methotrexate derivatives, Latex, Penicillins, and Sulfa antibiotics  Review of Systems   Review of Systems  Constitutional:  Per HPI, otherwise negative  HENT:       Per HPI, otherwise negative  Respiratory:       Per HPI, otherwise negative  Cardiovascular:       Per HPI, otherwise negative  Gastrointestinal: Negative for vomiting.  Endocrine:       Negative aside from HPI  Genitourinary:       Neg aside from HPI   Musculoskeletal:       Per HPI, otherwise negative  Skin: Positive for rash.  Neurological: Negative for syncope.    Physical Exam Updated Vital Signs BP (!) 135/95   Pulse (!) 56   Temp 98.6 F (37 C)   Resp 16   SpO2 98%   Physical Exam Vitals and nursing note reviewed.  Constitutional:      General: She is not in acute distress.    Appearance: She is well-developed.  HENT:     Head: Normocephalic and atraumatic.  Eyes:     Conjunctiva/sclera: Conjunctivae normal.  Cardiovascular:     Rate and Rhythm: Normal rate and regular rhythm.  Pulmonary:     Effort: Pulmonary effort is normal. No respiratory distress.     Breath sounds: Normal breath sounds. No stridor.  Abdominal:     General: There is no distension.  Genitourinary:   Skin:    General: Skin is warm and dry.     Findings: Rash present.     Comments: Diffuse  minimally raised, minimally appreciable erythematous lesions on all extremities.  No appreciable confluence anywhere.  Neurological:     Mental Status: She is alert and oriented to person, place, and time.     Cranial Nerves: No cranial nerve deficit.     ED Results / Procedures / Treatments    Procedures Procedures (including critical care time)  Medications Ordered in ED Medications  triamcinolone cream (KENALOG) 0.1 % (has no administration in time range)    ED Course  I have reviewed the triage vital signs and the nursing notes.  Pertinent labs & imaging results that were available during my care of the patient were reviewed by me and considered in my medical decision making (see chart for details).  This generally well-appearing female though with multiple medical issues including chronic cutaneous issues presents with concern for progression of rash to include both labial surfaces. Patient is awake, alert, in no distress, hemodynamically unremarkable, low suspicion for systemic progression, consideration given the patient's immunocompromise status using tacrolimus. There are some suspicion for progression of disease, though dermatitis is another possibility given the absence of purulence, substantial erythema, drainage. Patient amenable to starting topical cream here, following up with dermatology.  Patient started on medication, provided on discharge as well.  Final Clinical Impression(s) / ED Diagnoses Final diagnoses:  Rash     [name], MD 10/24/19 1750

## 2019-10-24 NOTE — ED Triage Notes (Signed)
Patient reports she has a hx of skin cancer and has "raw spots" in between her thighs that she is concerned about.

## 2019-12-17 ENCOUNTER — Encounter (HOSPITAL_COMMUNITY): Payer: Self-pay | Admitting: Emergency Medicine

## 2019-12-17 ENCOUNTER — Other Ambulatory Visit: Payer: Self-pay

## 2019-12-17 ENCOUNTER — Emergency Department (HOSPITAL_COMMUNITY)
Admission: EM | Admit: 2019-12-17 | Discharge: 2019-12-17 | Discharge status: Against Medical Advice | Payer: Medicare Other | Attending: Emergency Medicine | Admitting: Emergency Medicine

## 2019-12-17 DIAGNOSIS — R1310 Dysphagia, unspecified: Secondary | ICD-10-CM | POA: Diagnosis present

## 2019-12-17 DIAGNOSIS — F17210 Nicotine dependence, cigarettes, uncomplicated: Secondary | ICD-10-CM | POA: Diagnosis not present

## 2019-12-17 DIAGNOSIS — B370 Candidal stomatitis: Secondary | ICD-10-CM | POA: Insufficient documentation

## 2019-12-17 DIAGNOSIS — Z91040 Latex allergy status: Secondary | ICD-10-CM | POA: Insufficient documentation

## 2019-12-17 DIAGNOSIS — J449 Chronic obstructive pulmonary disease, unspecified: Secondary | ICD-10-CM | POA: Insufficient documentation

## 2019-12-17 DIAGNOSIS — I10 Essential (primary) hypertension: Secondary | ICD-10-CM | POA: Insufficient documentation

## 2019-12-17 DIAGNOSIS — N939 Abnormal uterine and vaginal bleeding, unspecified: Secondary | ICD-10-CM | POA: Insufficient documentation

## 2019-12-17 DIAGNOSIS — Z79899 Other long term (current) drug therapy: Secondary | ICD-10-CM | POA: Diagnosis not present

## 2019-12-17 DIAGNOSIS — Z85850 Personal history of malignant neoplasm of thyroid: Secondary | ICD-10-CM | POA: Insufficient documentation

## 2019-12-17 NOTE — ED Provider Notes (Signed)
[facility] EMERGENCY DEPARTMENT Provider Note   CSN: [id number] Arrival date & time: 12/17/19  1229     History No chief complaint on file.   Denise Simmons is a 69 y.o. female.  69 year old female with past medical history below including thyroid cancer, COPD, rheumatoid arthritis, anxiety/depression, COPD, hypertension, hyperlipidemia, migraines who presents with problems swallowing.  Patient reports at least a 76-month history of problems swallowing including getting food hung up in her throat and often vomiting because of it.  She has seen a gastroenterologist in [place] and previously had endoscopy and colonoscopy earlier this year.  Had biopsies during endoscopy which were okay.  She has also had a evaluation by ENT with scope, no abnormal findings.  She reports that lately her dysphagia has gotten worse and yesterday and today she has been choking with solid foods, feeling like when she swallows the food gets hung up and then comes back up.  Yesterday when this happened and she vomited, there was some blood.  Today, she tried to eat some banana and again, choked and vomited with some blood.  Since then, she has been able to drink an Ensure over several hours and has had no further vomiting, has swallowed this liquid okay.  She states that she has not been able to get back to [place] to follow-up with her gastroenterologist, now lives in this area.  She also reports multiple other complaints including vaginal spotting for which she has seen a GYN and skin problems for which she has seen a dermatologist.  The history is provided by the patient.       Past Medical History:  Diagnosis Date  . Allergic rhinitis   . Anxiety and depression   . Arthritis    rheumatoid  . Cancer ([place])    Thyroid  . Carpal tunnel syndrome   . COPD (chronic obstructive pulmonary disease) ([place])   . Fibromyalgia   . Hyperlipemia   . Hypertension   . Insomnia   . Migraine    . Neck pain   . Raynaud's disease   . Wrist pain, left     Patient Active Problem List   Diagnosis Date Noted  . Left wrist pain 07/08/2016  . Headache 07/08/2016  . Upper airway cough syndrome 07/04/2016  . Rheumatoid arthritis ([place]) 08/07/2011  . Cigarette smoker 08/07/2011  . Essential hypertension 06/20/2011  . COPD GOLD 0 still smoking 06/19/2011    Past Surgical History:  Procedure Laterality Date  . ABDOMINAL HYSTERECTOMY    . BREAST BIOPSY    . HEMORROIDECTOMY    . LUMBAR LAMINECTOMY/DECOMPRESSION MICRODISCECTOMY  10/09/2011   Procedure: LUMBAR LAMINECTOMY/DECOMPRESSION MICRODISCECTOMY 1 LEVEL;  Surgeon: Erline Levine, MD;  Location: [facility];  Service: Neurosurgery;  Laterality: Right;  RIGHT Lumbar four-five foraminotomy with possible microdiskectomy  . TUBAL LIGATION       OB History   No obstetric history on file.     Family History  Problem Relation Age of Onset  . Rheum arthritis Mother   . Breast cancer Mother   . Alzheimer's disease Mother   . Heart disease Mother   . Hypertension Mother   . Breast cancer Sister   . Ovarian cancer Maternal Grandmother   . Diabetes Maternal Grandmother   . Rectal cancer Paternal Grandmother   . Cirrhosis Father   . Prostate cancer Brother   . Pancreatic cancer Sister     Social History   Tobacco Use  . Smoking status:  Current Every Day Smoker    Packs/day: 1.50    Years: 25.00    Pack years: 37.50    Types: Cigarettes  . Smokeless tobacco: Never Used  Substance Use Topics  . Alcohol use: No    Comment: Quit in 2001  . Drug use: No    Comment: quit using maryjuana in 2001    Home Medications Prior to Admission medications   Medication Sig Start Date End Date Taking? Authorizing Provider  adalimumab (HUMIRA PEN) 40 MG/0.8ML injection Inject 40 mg into the skin every 14 (fourteen) days.    [provider]  albuterol (PROVENTIL HFA;VENTOLIN HFA) 108 (90 BASE) MCG/ACT inhaler Inhale 2 puffs  into the lungs 3 (three) times daily as needed. For shortness of breath     [provider]  albuterol (PROVENTIL) (2.5 MG/3ML) 0.083% nebulizer solution Take 2.5 mg by nebulization every 6 (six) hours as needed for wheezing or shortness of breath.    [provider]  benzocaine-menthol (CHLORAEPTIC) 6-10 MG lozenge Take 1 lozenge by mouth as needed for sore throat. Patient not taking: Reported on 06/08/2018 01/25/16   Sam, Olivia Canter, PA-C  benzonatate (TESSALON) 100 MG capsule Take 1 capsule (100 mg total) by mouth every 8 (eight) hours. Patient not taking: Reported on 06/08/2018 10/02/16   Jeannett Senior, PA-C  bisoprolol (ZEBETA) 5 MG tablet Take 5 mg by mouth daily.    Tanda Rockers, MD  cetirizine (ZYRTEC) 10 MG tablet Take 10 mg by mouth daily.    [provider]  colchicine 0.6 MG tablet Take 0.6 mg by mouth 2 (two) times daily. 05/17/18   [provider]  cyclobenzaprine (FLEXERIL) 10 MG tablet Take 1 tablet (10 mg total) by mouth 2 (two) times daily as needed for muscle spasms. Patient not taking: Reported on 06/08/2018 03/10/16   Domenic Moras, PA-C  diazepam (VALIUM) 5 MG tablet Take 5 mg by mouth daily as needed for anxiety or sleep.    [provider]  Emollient (CERAVE) CREA Apply 1 application topically 2 (two) times daily as needed (break outs).     [provider]  esomeprazole (NEXIUM) 40 MG capsule Take 40 mg by mouth 2 (two) times daily.      [provider]  famotidine (PEPCID) 20 MG tablet Take 20 mg by mouth at bedtime. With nexium    [provider]  fluconazole (DIFLUCAN) 150 MG tablet Take 150 mg by mouth once a week. 05/17/18   [provider]  levothyroxine (SYNTHROID, LEVOTHROID) 112 MCG tablet Take 112 mcg by mouth daily. 03/04/18   [provider]  lidocaine (XYLOCAINE) 2 % solution Take 5 mLs by mouth daily as needed for mouth pain.  04/18/18   [provider]    methocarbamol (ROBAXIN) 500 MG tablet Take 1 tablet (500 mg total) by mouth every 8 (eight) hours as needed for muscle spasms. 06/08/18   Antonietta Breach, PA-C  montelukast (SINGULAIR) 10 MG tablet Take 10 mg by mouth at bedtime.      [provider]  nystatin (MYCOSTATIN) 100000 UNIT/ML suspension Take 30 mLs by mouth daily as needed (mouth pain).  06/04/17   [provider]  Olopatadine HCl (PATADAY) 0.2 % SOLN Place 1 drop into both eyes 2 (two) times daily.     [provider]  oxyCODONE-acetaminophen (PERCOCET) 10-325 MG per tablet Take 0.5 tablets by mouth every 6 (six) hours as needed. For pain Patient not taking: Reported on 06/08/2018 05/12/12  [name], MD  sodium chloride (OCEAN) 0.65 % SOLN nasal spray Place 1 spray into both nostrils as needed for congestion. Patient not taking: Reported on 06/08/2018 10/02/16   Jeannett Senior, PA-C  topiramate (TOPAMAX) 100 MG tablet Take 1 tablet (100 mg total) by mouth 2 (two) times daily. 07/09/16   Marcial Pacas, MD  triamcinolone cream (KENALOG) 0.1 % Apply topically 3 (three) times daily. Patient not taking: Reported on 06/08/2018 08/22/12   [name], MD    Allergies    Celebrex [celecoxib], Doxycycline, Fentanyl, Lisinopril, Lyrica [pregabalin], Naproxen, Nsaids, Prednisone, Sumatriptan, Estradiol, Fish allergy, Methotrexate derivatives, Latex, Penicillins, and Sulfa antibiotics  Review of Systems   Review of Systems All other systems reviewed and are negative except that which was mentioned in HPI  Physical Exam Updated Vital Signs BP 134/82   Pulse (!) 43   Temp 98.6 F (37 C) (Oral)   Resp 16   SpO2 99%   Physical Exam Vitals and nursing note reviewed.  Constitutional:      General: She is not in acute distress.    Appearance: She is well-developed.  HENT:     Head: Normocephalic and atraumatic.     Mouth/Throat:     Mouth: Mucous membranes are moist.     Comments: Thrush on R lower  gums, dentures on upper gumline Eyes:     Conjunctiva/sclera: Conjunctivae normal.  Cardiovascular:     Rate and Rhythm: Normal rate and regular rhythm.     Heart sounds: Normal heart sounds. No murmur heard.   Pulmonary:     Effort: Pulmonary effort is normal.     Breath sounds: Normal breath sounds.  Abdominal:     General: Bowel sounds are normal. There is no distension.     Palpations: Abdomen is soft.     Tenderness: There is no abdominal tenderness.  Musculoskeletal:     Cervical back: Neck supple.  Skin:    General: Skin is warm and dry.  Neurological:     Mental Status: She is alert and oriented to person, place, and time.     Comments: Fluent speech  Psychiatric:        Judgment: Judgment normal.     ED Results / Procedures / Treatments   Labs (all labs ordered are listed, but only abnormal results are displayed) Labs Reviewed - No data to display  EKG None  Radiology No results found.  Procedures Procedures (including critical care time)  Medications Ordered in ED Medications - No data to display  ED Course  I have reviewed the triage vital signs and the nursing notes.  Pertinent labs & imaging results that were available during my care of the patient were reviewed by me and considered in my medical decision making (see chart for details).    MDM Rules/Calculators/A&P                          Comfortable on exam, tolerating secretions.  I clarified with her that she was definitely able to tolerate an entire Ensure shake after her episode of choking/vomiting when trying to eat the banana earlier today.  I do not feel that she has food impaction.  Small amount of bleeding with vomiting could be due to Mallory-Weiss tear but she has no ongoing sx suggestive of acute upper GI bleed. she does have evidence of thrush and I explained that if she has esophageal candidiasis this could be worsening her symptoms.  She already has nystatin liquid at home and  understands need to swish/swallow twice a day for 1 week or until mouth thrush resolves.  I explained to her that because she is not acutely impacted, she does not require an endoscopy emergently in the ED and will need to follow closely with her gastroenterologist.  I recommended that she get someone local as she is having problems getting all the way back to [facility].  I contacted the call service for [facility] to help arrange close follow up. While I was awaiting a call back, the patient eloped. Final Clinical Impression(s) / ED Diagnoses Final diagnoses:  Dysphagia, unspecified type    Rx / DC Orders ED Discharge Orders    None       Nina Mondor, Wenda Overland, MD 12/17/19 [phone]

## 2019-12-17 NOTE — ED Triage Notes (Signed)
Pt states she has been unable to swallow since yesterday.  States when she tries to eat the food gets stuck and she starts vomiting.  Reports history of having esophagus dilated.

## 2019-12-17 NOTE — ED Notes (Signed)
Patient verbalizes understanding of discharge instructions. Opportunity for questioning and answers were provided. Armband removed by staff, pt discharged from ED ambulatory.   

## 2020-02-25 ENCOUNTER — Emergency Department (HOSPITAL_COMMUNITY)
Admission: EM | Admit: 2020-02-25 | Discharge: 2020-02-25 | Disposition: A | Discharge status: Home / Self Care | Payer: Medicare Other | Attending: Emergency Medicine | Admitting: Emergency Medicine

## 2020-02-25 ENCOUNTER — Other Ambulatory Visit: Payer: Self-pay

## 2020-02-25 ENCOUNTER — Emergency Department (HOSPITAL_COMMUNITY): Payer: Medicare Other

## 2020-02-25 ENCOUNTER — Encounter (HOSPITAL_COMMUNITY): Payer: Self-pay | Admitting: Emergency Medicine

## 2020-02-25 DIAGNOSIS — Z5321 Procedure and treatment not carried out due to patient leaving prior to being seen by health care provider: Secondary | ICD-10-CM | POA: Insufficient documentation

## 2020-02-25 DIAGNOSIS — R0602 Shortness of breath: Secondary | ICD-10-CM | POA: Insufficient documentation

## 2020-02-25 DIAGNOSIS — M549 Dorsalgia, unspecified: Secondary | ICD-10-CM | POA: Diagnosis present

## 2020-02-25 NOTE — ED Triage Notes (Signed)
Pt reports last week was lifting something she shouldn't have and felt a pop in her back. Reports back pains, pains radiate on left side, reports SOB and gets tired quickly and having to sit down more frequently.

## 2020-04-04 ENCOUNTER — Telehealth: Payer: Self-pay | Admitting: Internal Medicine

## 2020-04-04 NOTE — Telephone Encounter (Signed)
Spoke with pt. She has been scheduled with MW on 04/11/20 at 1430. Nothing further was needed.

## 2020-04-11 ENCOUNTER — Other Ambulatory Visit: Payer: Self-pay

## 2020-04-11 ENCOUNTER — Ambulatory Visit (INDEPENDENT_AMBULATORY_CARE_PROVIDER_SITE_OTHER): Payer: Medicare Other | Admitting: Internal Medicine

## 2020-04-11 ENCOUNTER — Encounter: Payer: Self-pay | Admitting: Internal Medicine

## 2020-04-11 DIAGNOSIS — F17210 Nicotine dependence, cigarettes, uncomplicated: Secondary | ICD-10-CM | POA: Diagnosis not present

## 2020-04-11 DIAGNOSIS — R058 Other specified cough: Secondary | ICD-10-CM | POA: Diagnosis not present

## 2020-04-11 DIAGNOSIS — J449 Chronic obstructive pulmonary disease, unspecified: Secondary | ICD-10-CM | POA: Diagnosis not present

## 2020-04-11 NOTE — Patient Instructions (Signed)
Let your rheumatologist know that you are positive for covid before taking more humira   Strict isolation for now.  GERD (REFLUX)  is an extremely common cause of respiratory symptoms just like yours , many times with no obvious heartburn at all.    It can be treated with medication, but also with lifestyle changes including elevation of the head of your bed (ideally with 6 -8inch blocks under the headboard of your bed),  Smoking cessation, avoidance of late meals, excessive alcohol, and avoid fatty foods, chocolate, peppermint, colas, red wine, and acidic juices such as orange juice.  NO MINT OR MENTHOL PRODUCTS SO NO COUGH DROPS  USE SUGARLESS CANDY INSTEAD (Jolley ranchers or Stover's or Life Savers) or even ice chips will also do - the key is to swallow to prevent all throat clearing. NO OIL BASED VITAMINS - use powdered substitutes.  Avoid fish oil when coughing.  The key is to stop smoking completely before smoking completely stops you!     Please schedule a follow up office visit in 2 weeks, sooner if needed  with all medications /inhalers/ solutions in hand so we can verify exactly what you are taking. This includes all medications from all doctors and over the counters

## 2020-04-11 NOTE — Progress Notes (Signed)
Subjective:    Patient ID: Denise Simmons, female    DOB: Oct 06, 1950,     MRN: [id number]      Brief patient profile:  37  yobf active smoker with refractory upper airway symptoms on ACEI when first seen in pulmonary clinic in 2012 but proved to have very minimal airflow obst by pft's 08/07/2011 and did not meet the criteria for copd as recently as 07/03/2016 so has been followed prn with most of the effort in pulmonary clinic directed toward smoking cessation    History of Present Illness  06/19/2011 1st pulmonary cc worse sob  since Oct 30th 2012 when moved to [place] cc sorethroat, trouble swallowing s/p dilation on ACEI cough severe, mostly daytime and dry, comes and goes,  right now better,  Sometimes vomiting. mutiple nebs and inhalers not really helping but the cough med helps the most with her breathing which is better as long as cough is controlled Has RA but  States ok control. rec Try aciphex  20mg   (or Nexium) Take 30-60 min before first meal of the day and Pepcid 20 mg one bedtime  Stop lisinopril and start bystolic 5 mg one in it's place Stop smoking before it stops it  Only use the nebulizer for your breathing if you need it   05/04/2012 f/u ov/Meagon Duskin cc breathing better, now being followed by Dr Donneta Romberg for "allergies" but still smoking.  rec The key is to stop smoking completely before smoking completely stops you - this is the most important aspect of your care! Pulmonary follow up is as needed     07/03/2016   Pulmonary office visit/ new pt eval as last seen > 4 y prior to [place]  / [facility] / maint on singulair/zyrtec "not helping"  Chief Complaint  Patient presents with  . Pulm Consult    For sinus congestion, sore throat since Feb. 2017. Occasional episodes of SOB and chest pain.   started needing to inhalers /nebs 2014 but no change in last year avg use sev times a week / hfa only  Avg 2 x a month wakes up with some cough/choking rx by [facility] with sensation  of pnds constant day > noct maint on nexium bid 45 min ac and pepcid / has throat stretched every 8 m Doe = limited more by knees than sob  Has seen ENT in [place] for persistent nasal congestion and sore throat x 11 month/ Dr Jannifer Franklin records requested  Feels some better but not sure what she's taken or whether it helps Convinced multiple environmental triggers are causing these symptoms but can't be the cigarettes rec GERD   The key is to stop smoking completely before smoking completely stops you! - it's not too late Plan B = Backup Only use your albuterol as a rescue medication  Plan C = Crisis - only use your albuterol nebulizer if you first try Plan B and it fails to help > ok to use the nebulizer up to every 4 hours but if start needing it regularly call for immediate appointment Please see patient coordinator before you leave today  to schedule primary care for follow up of high blood pressure      04/11/2020  Seen as new pt ov to re-establish/Berry Gallacher re: choking with eval [facility] / [name]  Chief Complaint  Patient presents with  . Consult    shortness of breath with exertion  swallowing worse x 6 months  S/p surgery and radioactive iodine  for thyroid cancer  Dyspnea:  Worse doe x 2.5 months =  25 ft  Cough: no sputum, just "choking" sensation  Sleeping: in recliner 45 degrees otherwise sense of choking  SABA use: lately using more proventil   seems to help none on day  Of ov  02: note No change in these symptoms x months and tested on Apr 02 2020 for covid p "remote exposure" and COVID test was POS but told she was just a carrier per pt - not she is on Humira injections weekly and has not disclosed the Pos test  to rheumatology     No obvious day to day or daytime variability or assoc excess/ purulent sputum or mucus plugs or hemoptysis or cp or chest tightness, subjective wheeze or overt sinus or hb symptoms.   Sleeping as above  without nocturnal  or early am  exacerbation  of respiratory  c/o's or need for noct saba. Also denies any obvious fluctuation of symptoms with weather or environmental changes or other aggravating or alleviating factors except as outlined above   No unusual exposure hx or h/o childhood pna/ asthma or knowledge of premature birth.  Current Allergies, Complete Past Medical History, Past Surgical History, Family History, and Social History were reviewed in [employer] record.  ROS  The following are not active complaints unless bolded Hoarseness, sore throat, dysphagia, dental problems, itching, sneezing,  nasal congestion or discharge of excess mucus or purulent secretions, ear ache,   fever, chills, sweats, unintended wt loss or wt gain, classically pleuritic or exertional cp,  orthopnea pnd or arm/hand swelling  or leg swelling, presyncope, palpitations, abdominal pain, anorexia, nausea, vomiting, diarrhea  or change in bowel habits or change in bladder habits, change in stools or change in urine, dysuria, hematuria,  rash, arthralgias, visual complaints, headache, numbness, weakness or ataxia or problems with walking or coordination,  change in mood or  memory.        Current Meds - - NOTE:   Unable to verify as accurately reflecting what pt takes     Medication Sig  . adalimumab (HUMIRA PEN) 40 MG/0.8ML injection Inject 40 mg into the skin every 14 (fourteen) days.  Marland Kitchen albuterol (PROVENTIL HFA;VENTOLIN HFA) 108 (90 BASE) MCG/ACT inhaler Inhale 2 puffs into the lungs 3 (three) times daily as needed. For shortness of breath   . albuterol (PROVENTIL) (2.5 MG/3ML) 0.083% nebulizer solution Take 2.5 mg by nebulization every 6 (six) hours as needed for wheezing or shortness of breath.  . bisoprolol (ZEBETA) 5 MG tablet Take 5 mg by mouth daily.  . cetirizine (ZYRTEC) 10 MG tablet Take 10 mg by mouth daily.  . colchicine 0.6 MG tablet Take 0.6 mg by mouth 2 (two) times daily.  . cyclobenzaprine (FLEXERIL) 10 MG  tablet Take 1 tablet (10 mg total) by mouth 2 (two) times daily as needed for muscle spasms.  . diazepam (VALIUM) 5 MG tablet Take 5 mg by mouth daily as needed for anxiety or sleep.  Marland Kitchen Emollient (CERAVE) CREA Apply 1 application topically 2 (two) times daily as needed (break outs).   . esomeprazole (NEXIUM) 40 MG capsule Take 40 mg by mouth 2 (two) times daily.    . famotidine (PEPCID) 20 MG tablet Take 20 mg by mouth at bedtime. With nexium  . fluconazole (DIFLUCAN) 150 MG tablet Take 150 mg by mouth once a week.  . fluocinonide ointment (LIDEX) 3.82 % Apply 1 application topically 2 (two) times daily.  Marland Kitchen  levothyroxine (SYNTHROID, LEVOTHROID) 112 MCG tablet Take 112 mcg by mouth daily.  Marland Kitchen lidocaine (XYLOCAINE) 2 % solution Take 5 mLs by mouth daily as needed for mouth pain.   . montelukast (SINGULAIR) 10 MG tablet Take 10 mg by mouth at bedtime.    Marland Kitchen nystatin (MYCOSTATIN) 100000 UNIT/ML suspension Take 30 mLs by mouth daily as needed (mouth pain).   . Olopatadine HCl (PATADAY) 0.2 % SOLN Place 1 drop into both eyes 2 (two) times daily.   Marland Kitchen oxyCODONE-acetaminophen (PERCOCET) 10-325 MG per tablet Take 0.5 tablets by mouth every 6 (six) hours as needed. For pain  . sodium chloride (OCEAN) 0.65 % SOLN nasal spray Place 1 spray into both nostrils as needed for congestion.  . topiramate (TOPAMAX) 100 MG tablet Take 1 tablet (100 mg total) by mouth 2 (two) times daily.  Marland Kitchen triamcinolone cream (KENALOG) 0.1 % Apply topically 3 (three) times daily.                           Objective:   Physical Exam  Wt Readings from Last 3 Encounters:  04/11/20 155 lb 12.8 oz (70.7 kg)  03/06/19 157 lb (71.2 kg)  06/08/18 157 lb 14.4 oz (71.6 kg)     Vital signs reviewed - Note on arrival 04/11/2020  02 sats  99% on RA  amb anxious bf nad jumping from one topic/test/dx to the next    HEENT : pt wearing mask not removed for exam due to covid -19 concerns.    NECK :  without JVD/Nodes/TM/ nl  carotid upstrokes bilaterally   LUNGS: no acc muscle use,  Nl contour chest which is clear to A and P bilaterally without cough on insp or exp maneuvers   CV:  RRR  no s3 or murmur or increase in P2, and no edema   ABD:  soft and nontender with nl inspiratory excursion in the supine position. No bruits or organomegaly appreciated, bowel sounds nl  MS:  Nl gait/ ext warm without deformities, calf tenderness, cyanosis or clubbing No obvious joint restrictions   SKIN: warm and dry without lesions    NEURO:  alert, approp, nl sensorium with  no motor or cerebellar deficits apparent.          I personally reviewed images and agree with radiology impression as follows:  CXR:   02/25/20  No active cardiopulmonary disease.          Assessment & Plan:

## 2020-04-12 ENCOUNTER — Encounter: Payer: Self-pay | Admitting: Internal Medicine

## 2020-04-12 NOTE — Assessment & Plan Note (Signed)
Counseled re importance of smoking cessation but did not meet time criteria for separate billing            Each maintenance medication was reviewed in detail including emphasizing most importantly the difference between maintenance and prns and under what circumstances the prns are to be triggered using an action plan format where appropriate.  Total time for H and P, chart review, counseling, teaching device and generating customized AVS unique to this office visit / charting = 45 min      

## 2020-04-12 NOTE — Assessment & Plan Note (Addendum)
Active smoker -  off ACEI 06/19/2011  > much better 08/07/2011  -  PFT's  08/07/2011  with min airflow obst but DLCO 60% corrects to 86% ? RA related - Spirometry 07/03/2016  FEV1 2.25 (102%)  Ratio 73 with mild curvature off all rx    Again despite smoking lungs are clear and only rx should be saba prn   I spent extra time with pt today reviewing appropriate use of albuterol for prn use on exertion with the following points: 1) saba is for relief of sob that does not improve by walking a slower pace or resting but rather if the pt does not improve after trying this first. 2) If the pt is convinced, as many are, that saba helps recover from activity faster then it's easy to tell if this is the case by re-challenging : ie stop, take the inhaler, then p 5 minutes try the exact same activity (intensity of workload) that just caused the symptoms and see if they are substantially diminished or not after saba 3) if there is an activity that reproducibly causes the symptoms, try the saba 15 min before the activity on alternate days   If in fact the saba really does help, then fine to continue to use it prn but advised may need to look closer at the maintenance regimen being used to achieve better control of airways disease with exertion.

## 2020-04-12 NOTE — Assessment & Plan Note (Signed)
ACE intol 2012   Upper airway cough syndrome (previously labeled PNDS),  is so named because it's frequently impossible to sort out how much is  CR/sinusitis with freq throat clearing (which can be related to primary GERD)   vs  causing  secondary (" extra esophageal")  GERD from wide swings in gastric pressure that occur with throat clearing, often  promoting self use of mint and menthol lozenges that reduce the lower esophageal sphincter tone and exacerbate the problem further in a cyclical fashion.   These are the same pts (now being labeled as having "irritable larynx syndrome" by some cough centers) who not infrequently have a history of having failed to tolerate ace inhibitors,  dry powder inhalers or biphosphonates or report having atypical/extraesophageal reflux symptoms that don't respond to standard doses of PPI  and are easily confused as having aecopd or asthma flares by even experienced allergists/ pulmonologists (myself included).   She has apparently been seen by multiple ent and gi doctors not in our emr network with nothing I can offer from a pulmonary perspective that might help other than convincing her she needs to quit smoking completely and follow gerd diet/ rx to the letter.   I am willing to see her when she is 3 weeks out from Pos test for covid with the stipulation she tell her rheumatologist / provider of Humira that she has a Pos covid test.   On return must bring  all meds in hand using a trust but verify approach to confirm accurate Medication  Reconciliation The principal here is that until we are certain that the  patients are doing what we've asked, it makes no sense to ask them to do more.

## 2020-04-26 ENCOUNTER — Encounter: Payer: Self-pay | Admitting: Internal Medicine

## 2020-04-26 ENCOUNTER — Ambulatory Visit (INDEPENDENT_AMBULATORY_CARE_PROVIDER_SITE_OTHER): Payer: Medicare Other | Admitting: Internal Medicine

## 2020-04-26 ENCOUNTER — Other Ambulatory Visit: Payer: Self-pay

## 2020-04-26 DIAGNOSIS — J449 Chronic obstructive pulmonary disease, unspecified: Secondary | ICD-10-CM

## 2020-04-26 DIAGNOSIS — R058 Other specified cough: Secondary | ICD-10-CM | POA: Diagnosis not present

## 2020-04-26 DIAGNOSIS — F17210 Nicotine dependence, cigarettes, uncomplicated: Secondary | ICD-10-CM

## 2020-04-26 NOTE — Patient Instructions (Signed)
See your ENT doctor if you have sensation of something stuck in throat and you feel like choking when you lie flat   The key is to stop smoking completely before smoking completely stops you!  Pulmonary follow up is as needed

## 2020-04-26 NOTE — Progress Notes (Signed)
Subjective:    Patient ID: Denise Simmons, female    DOB: 1950-09-14,     MRN: [id number]      Brief patient profile:  17  yobf active smoker with refractory upper airway symptoms on ACEI when first seen in pulmonary clinic in 2012 but proved to have very minimal airflow obst by pft's 08/07/2011 and did not meet the criteria for copd as recently as 07/03/2016 so has been followed prn with most of the effort in pulmonary clinic directed toward smoking cessation    History of Present Illness  06/19/2011 1st pulmonary cc worse sob  since Oct 30th 2012 when moved to [place] cc sorethroat, trouble swallowing s/p dilation on ACEI cough severe, mostly daytime and dry, comes and goes,  right now better,  Sometimes vomiting. mutiple nebs and inhalers not really helping but the cough med helps the most with her breathing which is better as long as cough is controlled Has RA but  States ok control. rec Try aciphex  20mg   (or Nexium) Take 30-60 min before first meal of the day and Pepcid 20 mg one bedtime  Stop lisinopril and start bystolic 5 mg one in it's place Stop smoking before it stops it  Only use the nebulizer for your breathing if you need it   05/04/2012 f/u ov/Denise Simmons cc breathing better, now being followed by Dr Denise Simmons for "allergies" but still smoking.  rec The key is to stop smoking completely before smoking completely stops you - this is the most important aspect of your care! Pulmonary follow up is as needed     07/03/2016   Pulmonary office visit/ new pt eval as last seen > 4 y prior to [place]  / Denise Simmons / maint on singulair/zyrtec "not helping"  Chief Complaint  Patient presents with  . Pulm Consult    For sinus congestion, sore throat since Feb. 2017. Occasional episodes of SOB and chest pain.   started needing to inhalers /nebs 2014 but no change in last year avg use sev times a week / hfa only  Avg 2 x a month wakes up with some cough/choking rx by [facility] with sensation  of pnds constant day > noct maint on nexium bid 45 min ac and pepcid / has throat stretched every 8 m Doe = limited more by knees than sob  Has seen ENT in [place] for persistent nasal congestion and sore throat x 11 month/ Dr Denise Simmons records requested  Feels some better but not sure what she's taken or whether it helps Convinced multiple environmental triggers are causing these symptoms but can't be the cigarettes rec GERD   The key is to stop smoking completely before smoking completely stops you! - it's not too late Plan B = Backup Only use your albuterol as a rescue medication  Plan C = Crisis - only use your albuterol nebulizer if you first try Plan B and it fails to help > ok to use the nebulizer up to every 4 hours but if start needing it regularly call for immediate appointment Please see patient coordinator before you leave today  to schedule primary care for follow up of high blood pressure      04/11/2020  Seen as new pt ov to re-establish/Denise Simmons re: choking with eval [facility] / Denise Simmons  Chief Complaint  Patient presents with  . Consult    shortness of breath with exertion  swallowing worse x 6 months  S/p surgery and radioactive iodine  for thyroid cancer  Dyspnea:  Worse doe x 2.5 months =  25 ft  Cough: no sputum, just "choking" sensation  Sleeping: in recliner 45 degrees otherwise sense of choking  SABA use: lately using more proventil   seems to help none on day  Of ov  02: note No change in these symptoms x months and tested on Apr 02 2020 for covid p "remote exposure" and COVID test was POS but told she was just a carrier per pt - note  she is on Humira injections weekly and has not disclosed the Pos test  to rheumatology  rec Let your rheumatologist know that you are positive for covid before taking more humira  Strict isolation for now. GERD  Diet  The key is to stop smoking completely before smoking completely stops you! Please schedule a follow up  office visit in 2 weeks, sooner if needed  with all medications /inhalers/ solutions in hand so we can verify exactly what you are taking. This includes all medications from all doctors and over the counters   04/26/2020  f/u ov/Denise Simmons re: GOLD 0 copd with  UACS/ choking sensation  is better / still smoking  Chief Complaint  Patient presents with  . Follow-up    no complaints    Dyspnea:  Not limited by breathing from desired activities   Cough: resolved  Sleeping: recliner at 45 degrees because of  coughing but has improved and hasn't tried to lie flatter  SABA use: none  02: none    No obvious day to day or daytime variability or assoc excess/ purulent sputum or mucus plugs or hemoptysis or cp or chest tightness, subjective wheeze or overt sinus or hb symptoms.   Sleeping  without nocturnal  or early am exacerbation  of respiratory  c/o's or need for noct saba. Also denies any obvious fluctuation of symptoms with weather or environmental changes or other aggravating or alleviating factors except as outlined above   No unusual exposure hx or h/o childhood pna/ asthma or knowledge of premature birth.  Current Allergies, Complete Past Medical History, Past Surgical History, Family History, and Social History were reviewed in [employer] record.  ROS  The following are not active complaints unless bolded Hoarseness, sore throat, dysphagia both improved, dental problems, itching, sneezing,  nasal congestion or discharge of excess mucus or purulent secretions, ear ache,   fever, chills, sweats, unintended wt loss or wt gain, classically pleuritic or exertional cp,  orthopnea pnd or arm/hand swelling  or leg swelling, presyncope, palpitations, abdominal pain, anorexia, nausea, vomiting, diarrhea  or change in bowel habits or change in bladder habits, change in stools or change in urine, dysuria, hematuria,  rash, arthralgias, visual complaints, headache, numbness, weakness or  ataxia or problems with walking or coordination,  change in mood or  memory.        Current Meds  Medication Sig  . albuterol (PROVENTIL HFA;VENTOLIN HFA) 108 (90 BASE) MCG/ACT inhaler Inhale 2 puffs into the lungs 3 (three) times daily as needed. For shortness of breath   . albuterol (PROVENTIL) (2.5 MG/3ML) 0.083% nebulizer solution Take 2.5 mg by nebulization every 6 (six) hours as needed for wheezing or shortness of breath.  . bisoprolol (ZEBETA) 5 MG tablet Take 5 mg by mouth daily.  . cetirizine (ZYRTEC) 10 MG tablet Take 10 mg by mouth daily.  . colchicine 0.6 MG tablet Take 0.6 mg by mouth 2 (two) times daily.  . cyclobenzaprine (FLEXERIL)  10 MG tablet Take 1 tablet (10 mg total) by mouth 2 (two) times daily as needed for muscle spasms.  . diazepam (VALIUM) 5 MG tablet Take 5 mg by mouth daily as needed for anxiety or sleep.  Marland Kitchen Emollient (CERAVE) CREA Apply 1 application topically 2 (two) times daily as needed (break outs).   . esomeprazole (NEXIUM) 40 MG capsule Take 40 mg by mouth 2 (two) times daily.    . famotidine (PEPCID) 20 MG tablet Take 20 mg by mouth at bedtime. With nexium  . fluconazole (DIFLUCAN) 150 MG tablet Take 150 mg by mouth once a week.  . fluocinonide ointment (LIDEX) 7.90 % Apply 1 application topically 2 (two) times daily.  Marland Kitchen levothyroxine (SYNTHROID, LEVOTHROID) 112 MCG tablet Take 112 mcg by mouth daily.  Marland Kitchen lidocaine (XYLOCAINE) 2 % solution Take 5 mLs by mouth daily as needed for mouth pain.   . montelukast (SINGULAIR) 10 MG tablet Take 10 mg by mouth at bedtime.    Marland Kitchen nystatin (MYCOSTATIN) 100000 UNIT/ML suspension Take 30 mLs by mouth daily as needed (mouth pain).   . Olopatadine HCl (PATADAY) 0.2 % SOLN Place 1 drop into both eyes 2 (two) times daily.   Marland Kitchen oxyCODONE-acetaminophen (PERCOCET) 10-325 MG per tablet Take 0.5 tablets by mouth every 6 (six) hours as needed. For pain  . topiramate (TOPAMAX) 100 MG tablet Take 1 tablet (100 mg total) by mouth 2 (two)  times daily.  Marland Kitchen triamcinolone cream (KENALOG) 0.1 % Apply topically 3 (three) times daily.                        Objective:   Physical Exam   04/26/2020     157   04/11/20 155 lb 12.8 oz (70.7 kg)  03/06/19 157 lb (71.2 kg)  06/08/18 157 lb 14.4 oz (71.6 kg)     Vital signs reviewed - Note on arrival 04/11/2020  02 sats  99% on RA   HEENT : pt wearing mask not removed for exam due to covid -19 concerns.    NECK :  without JVD/Nodes/TM/ nl carotid upstrokes bilaterally   LUNGS: no acc muscle use,  Nl contour chest which is clear to A and P bilaterally without cough on insp or exp maneuvers   CV:  RRR  no s3 or murmur or increase in P2, and no edema   ABD:  soft and nontender with nl inspiratory excursion in the supine position. No bruits or organomegaly appreciated, bowel sounds nl  MS:  Nl gait/ ext warm without deformities, calf tenderness, cyanosis or clubbing No obvious joint restrictions   SKIN: warm and dry without lesions    NEURO:  alert, unusual affect but nl sensorium with  no motor or cerebellar deficits apparent.      I personally reviewed images and agree with radiology impression as follows:  CXR:   02/25/20  No active cardiopulmonary disease.      Assessment & Plan:

## 2020-04-27 ENCOUNTER — Encounter: Payer: Self-pay | Admitting: Internal Medicine

## 2020-04-27 NOTE — Assessment & Plan Note (Addendum)
Active smoker -  off ACEI 06/19/2011  > much better 08/07/2011  -  PFT's  08/07/2011  with min airflow obst but DLCO 60% corrects to 86% ? RA related - Spirometry 07/03/2016  FEV1 2.25 (102%)  Ratio 73 with mild curvature off all rx     No indication for any rx but   prn saba and clean air (d/c cigs, see below)

## 2020-04-27 NOTE — Assessment & Plan Note (Signed)
ACE intol 2012   Improved on acid suppression and gerd diet, no additional w/u needed as being followed by ent.

## 2020-04-27 NOTE — Assessment & Plan Note (Signed)
Counseled re importance of smoking cessation but did not meet time criteria for separate billing           Each maintenance medication was reviewed in detail including emphasizing most importantly the difference between maintenance and prns and under what circumstances the prns are to be triggered using an action plan format where appropriate.  Total time for H and P, chart review, counseling, teaching device and generating customized AVS unique to this office visit / charting = 15 min        

## 2020-05-07 ENCOUNTER — Other Ambulatory Visit: Payer: Self-pay | Admitting: Neurosurgery

## 2020-05-07 DIAGNOSIS — S32029A Unspecified fracture of second lumbar vertebra, initial encounter for closed fracture: Secondary | ICD-10-CM

## 2020-05-26 ENCOUNTER — Other Ambulatory Visit: Payer: Self-pay

## 2020-05-26 ENCOUNTER — Ambulatory Visit
Admission: RE | Admit: 2020-05-26 | Discharge: 2020-05-26 | Disposition: A | Discharge status: Home / Self Care | Payer: Medicare Other | Source: Ambulatory Visit | Attending: Neurosurgery | Admitting: Neurosurgery

## 2020-05-26 DIAGNOSIS — S32029A Unspecified fracture of second lumbar vertebra, initial encounter for closed fracture: Secondary | ICD-10-CM

## 2020-05-26 MED ORDER — GADOBENATE DIMEGLUMINE 529 MG/ML IV SOLN
15.0000 mL | Freq: Once | INTRAVENOUS | Status: AC | PRN
  Administered 2020-05-26: 15 mL via INTRAVENOUS

## 2020-10-09 ENCOUNTER — Emergency Department (HOSPITAL_BASED_OUTPATIENT_CLINIC_OR_DEPARTMENT_OTHER)
Admit: 2020-10-09 | Discharge: 2020-10-09 | Disposition: A | Discharge status: Home / Self Care | Payer: Medicare Other | Attending: Emergency Medicine | Admitting: Emergency Medicine

## 2020-10-09 ENCOUNTER — Emergency Department (HOSPITAL_COMMUNITY): Payer: Medicare Other

## 2020-10-09 ENCOUNTER — Encounter (HOSPITAL_COMMUNITY): Payer: Self-pay | Admitting: Emergency Medicine

## 2020-10-09 ENCOUNTER — Emergency Department (HOSPITAL_COMMUNITY)
Admission: EM | Admit: 2020-10-09 | Discharge: 2020-10-09 | Disposition: A | Discharge status: Home / Self Care | Payer: Medicare Other | Attending: Emergency Medicine | Admitting: Emergency Medicine

## 2020-10-09 DIAGNOSIS — M545 Low back pain, unspecified: Secondary | ICD-10-CM | POA: Diagnosis not present

## 2020-10-09 DIAGNOSIS — M791 Myalgia, unspecified site: Secondary | ICD-10-CM | POA: Diagnosis not present

## 2020-10-09 DIAGNOSIS — M79605 Pain in left leg: Secondary | ICD-10-CM | POA: Diagnosis not present

## 2020-10-09 DIAGNOSIS — Z91040 Latex allergy status: Secondary | ICD-10-CM | POA: Insufficient documentation

## 2020-10-09 DIAGNOSIS — Z85850 Personal history of malignant neoplasm of thyroid: Secondary | ICD-10-CM | POA: Diagnosis not present

## 2020-10-09 DIAGNOSIS — Z79899 Other long term (current) drug therapy: Secondary | ICD-10-CM | POA: Insufficient documentation

## 2020-10-09 DIAGNOSIS — I10 Essential (primary) hypertension: Secondary | ICD-10-CM | POA: Insufficient documentation

## 2020-10-09 DIAGNOSIS — J449 Chronic obstructive pulmonary disease, unspecified: Secondary | ICD-10-CM | POA: Insufficient documentation

## 2020-10-09 DIAGNOSIS — F17210 Nicotine dependence, cigarettes, uncomplicated: Secondary | ICD-10-CM | POA: Diagnosis not present

## 2020-10-09 DIAGNOSIS — G8929 Other chronic pain: Secondary | ICD-10-CM | POA: Diagnosis not present

## 2020-10-09 LAB — CBC WITH DIFFERENTIAL/PLATELET
Abs Immature Granulocytes: 0.01 10*3/uL (ref 0.00–0.07)
Basophils Absolute: 0 10*3/uL (ref 0.0–0.1)
Basophils Relative: 1 %
Eosinophils Absolute: 0.2 10*3/uL (ref 0.0–0.5)
Eosinophils Relative: 3 %
HCT: 41.4 % (ref 36.0–46.0)
Hemoglobin: 13.3 g/dL (ref 12.0–15.0)
Immature Granulocytes: 0 %
Lymphocytes Relative: 63 %
Lymphs Abs: 3 10*3/uL (ref 0.7–4.0)
MCH: 27.4 pg (ref 26.0–34.0)
MCHC: 32.1 g/dL (ref 30.0–36.0)
MCV: 85.2 fL (ref 80.0–100.0)
Monocytes Absolute: 0.3 10*3/uL (ref 0.1–1.0)
Monocytes Relative: 6 %
Neutro Abs: 1.3 10*3/uL — ABNORMAL LOW (ref 1.7–7.7)
Neutrophils Relative %: 27 %
Platelets: 134 10*3/uL — ABNORMAL LOW (ref 150–400)
RBC: 4.86 MIL/uL (ref 3.87–5.11)
RDW: 13.4 % (ref 11.5–15.5)
WBC: 4.7 10*3/uL (ref 4.0–10.5)
nRBC: 0 % (ref 0.0–0.2)

## 2020-10-09 LAB — BASIC METABOLIC PANEL
Anion gap: 7 (ref 5–15)
BUN: 9 mg/dL (ref 8–23)
CO2: 28 mmol/L (ref 22–32)
Calcium: 9.2 mg/dL (ref 8.9–10.3)
Chloride: 106 mmol/L (ref 98–111)
Creatinine, Ser: 0.63 mg/dL (ref 0.44–1.00)
GFR, Estimated: >60 mL/min (ref >60)
Glucose, Bld: 95 mg/dL (ref 70–99)
Potassium: 3.5 mmol/L (ref 3.5–5.1)
Sodium: 141 mmol/L (ref 135–145)

## 2020-10-09 MED ORDER — METHOCARBAMOL 500 MG PO TABS
500.0000 mg | ORAL_TABLET | Freq: Once | ORAL | Status: AC
  Administered 2020-10-09: 500 mg via ORAL
  Filled 2020-10-09: qty 1

## 2020-10-09 MED ORDER — ONDANSETRON HCL 4 MG/2ML IJ SOLN
4.0000 mg | Freq: Once | INTRAMUSCULAR | Status: AC
  Administered 2020-10-09: 4 mg via INTRAVENOUS
  Filled 2020-10-09: qty 2

## 2020-10-09 MED ORDER — MORPHINE SULFATE (PF) 4 MG/ML IV SOLN
4.0000 mg | Freq: Once | INTRAVENOUS | Status: AC
Start: 2020-10-09 — End: 2020-10-09
  Administered 2020-10-09: 4 mg via INTRAVENOUS
  Filled 2020-10-09: qty 1

## 2020-10-09 MED ORDER — LIDOCAINE 5 % EX PTCH
1.0000 | MEDICATED_PATCH | Freq: Once | CUTANEOUS | Status: DC
  Administered 2020-10-09: 1 via TRANSDERMAL
  Filled 2020-10-09: qty 1

## 2020-10-09 NOTE — Discharge Instructions (Signed)
Your evaluation today has been reassuring, ultrasound shows no evidence of blood clot in your leg, your chest x-ray is clear and lab work is reassuring.  Continue using your chronic pain medications to help treat this pain.  Suspect a lot of this pain is coming from your sciatica and chronic back pain.  Please follow-up closely with your regular doctor for further evaluation of this pain.  Return for new or worsening symptoms.

## 2020-10-09 NOTE — ED Provider Notes (Signed)
[facility] DEPT Provider Note   CSN: [id number] Arrival date & time: 10/09/20  1525     History Chief Complaint  Patient presents with  . generalized pain   . Back Pain  . Leg Swelling    Denise Simmons is a 70 y.o. female.  Denise Simmons is a 70 y.o. female with a history of COPD, fibromyalgia, hypertension, hyperlipidemia, arthritis, migraine, chronic back pain, thyroid cancer, who presents to the ED for evaluation of Generalized body pains for the past 2-3 months.  She reports that she has had most severe pain in her left leg.  Which she reports intermittently swells.  She denies any trauma or injury to the leg.  She reports that she has chronic back pain that they have been unable to do surgery on and has some sciatica but reports that this pain is worse than usual and she is concerned something else could be going on or she could have a blood clot.  She reports this pain seems to go into her leg but also up through her back and into her arms.  She denies anterior chest pain.  She reports when the pain starts to feel severe throughout her body she has some brief shortness of breath but no persistent breathing issues.  She denies any abdominal pain, nausea or vomiting.  No fevers.  No focal joint swelling.  Reports that she tried to call her PCP regarding the symptoms but it was going to be several weeks before she could be seen.  She reports that she takes Percocet chronically for pain, but tries to take low doses of this because she lives at home on her own and does not want to fall, she reports this has not really been helping her pain.  She has not tried anything else to treat the symptoms.  No other aggravating or alleviating factors.        Past Medical History:  Diagnosis Date  . Allergic rhinitis   . Anxiety and depression   . Arthritis    rheumatoid  . Cancer ([place])    Thyroid  . Carpal tunnel syndrome   . COPD (chronic  obstructive pulmonary disease) ([place])   . Fibromyalgia   . Hyperlipemia   . Hypertension   . Insomnia   . Migraine   . Neck pain   . Raynaud's disease   . Wrist pain, left     Patient Active Problem List   Diagnosis Date Noted  . Left wrist pain 07/08/2016  . Headache 07/08/2016  . Upper airway cough syndrome 07/04/2016  . Rheumatoid arthritis ([place]) 08/07/2011  . Cigarette smoker 08/07/2011  . Essential hypertension 06/20/2011  . COPD GOLD 0 still smoking 06/19/2011    Past Surgical History:  Procedure Laterality Date  . ABDOMINAL HYSTERECTOMY    . BREAST BIOPSY    . HEMORROIDECTOMY    . LUMBAR LAMINECTOMY/DECOMPRESSION MICRODISCECTOMY  10/09/2011   Procedure: LUMBAR LAMINECTOMY/DECOMPRESSION MICRODISCECTOMY 1 LEVEL;  Surgeon: Erline Levine, MD;  Location: [facility];  Service: Neurosurgery;  Laterality: Right;  RIGHT Lumbar four-five foraminotomy with possible microdiskectomy  . TUBAL LIGATION       OB History   No obstetric history on file.     Family History  Problem Relation Age of Onset  . Rheum arthritis Mother   . Breast cancer Mother   . Alzheimer's disease Mother   . Heart disease Mother   . Hypertension Mother   . Breast cancer Sister   .  Ovarian cancer Maternal Grandmother   . Diabetes Maternal Grandmother   . Rectal cancer Paternal Grandmother   . Cirrhosis Father   . Prostate cancer Brother   . Pancreatic cancer Sister     Social History   Tobacco Use  . Smoking status: Current Every Day Smoker    Packs/day: 1.50    Years: 25.00    Pack years: 37.50    Types: Cigarettes  . Smokeless tobacco: Never Used  . Tobacco comment: 15 cigarettes smoked daily 04/26/26 [facility]   Vaping Use  . Vaping Use: Never used  Substance Use Topics  . Alcohol use: No    Comment: Quit in 2001  . Drug use: No    Comment: quit using maryjuana in 2001    Home Medications Prior to Admission medications   Medication Sig Start Date End Date Taking? Authorizing  Provider  adalimumab (HUMIRA PEN) 40 MG/0.8ML injection Inject 40 mg into the skin every 14 (fourteen) days. Patient not taking: Reported on 04/26/2020    [provider]  albuterol (PROVENTIL HFA;VENTOLIN HFA) 108 (90 BASE) MCG/ACT inhaler Inhale 2 puffs into the lungs 3 (three) times daily as needed. For shortness of breath     [provider]  albuterol (PROVENTIL) (2.5 MG/3ML) 0.083% nebulizer solution Take 2.5 mg by nebulization every 6 (six) hours as needed for wheezing or shortness of breath.    [provider]  bisoprolol (ZEBETA) 5 MG tablet Take 5 mg by mouth daily.    Tanda Rockers, MD  cetirizine (ZYRTEC) 10 MG tablet Take 10 mg by mouth daily.    [provider]  colchicine 0.6 MG tablet Take 0.6 mg by mouth 2 (two) times daily. 05/17/18   [provider]  cyclobenzaprine (FLEXERIL) 10 MG tablet Take 1 tablet (10 mg total) by mouth 2 (two) times daily as needed for muscle spasms. 03/10/16   Domenic Moras, PA-C  diazepam (VALIUM) 5 MG tablet Take 5 mg by mouth daily as needed for anxiety or sleep.    [provider]  Emollient (CERAVE) CREA Apply 1 application topically 2 (two) times daily as needed (break outs).     [provider]  esomeprazole (NEXIUM) 40 MG capsule Take 40 mg by mouth 2 (two) times daily.      [provider]  famotidine (PEPCID) 20 MG tablet Take 20 mg by mouth at bedtime. With nexium    [provider]  fluconazole (DIFLUCAN) 150 MG tablet Take 150 mg by mouth once a week. 05/17/18   [provider]  fluocinonide ointment (LIDEX) 2.95 % Apply 1 application topically 2 (two) times daily. 12/06/19   [provider]  levothyroxine (SYNTHROID, LEVOTHROID) 112 MCG tablet Take 112 mcg by mouth daily. 03/04/18   [provider]  lidocaine (XYLOCAINE) 2 % solution Take 5 mLs by mouth daily as needed for mouth pain.  04/18/18   [provider]  montelukast  (SINGULAIR) 10 MG tablet Take 10 mg by mouth at bedtime.      [provider]  nystatin (MYCOSTATIN) 100000 UNIT/ML suspension Take 30 mLs by mouth daily as needed (mouth pain).  06/04/17   [provider]  Olopatadine HCl (PATADAY) 0.2 % SOLN Place 1 drop into both eyes 2 (two) times daily.     [provider]  oxyCODONE-acetaminophen (PERCOCET) 10-325 MG per tablet Take 0.5 tablets by mouth every 6 (six) hours as needed. For pain 05/12/12   [name], MD  sodium  chloride (OCEAN) 0.65 % SOLN nasal spray Place 1 spray into both nostrils as needed for congestion. Patient not taking: Reported on 04/26/2020 10/02/16   Jeannett Senior, PA-C  topiramate (TOPAMAX) 100 MG tablet Take 1 tablet (100 mg total) by mouth 2 (two) times daily. 07/09/16   Marcial Pacas, MD  triamcinolone cream (KENALOG) 0.1 % Apply topically 3 (three) times daily. 08/22/12   [name], MD    Allergies    Celebrex [celecoxib], Doxycycline, Fentanyl, Lisinopril, Lyrica [pregabalin], Naproxen, Nsaids, Prednisone, Sumatriptan, Estradiol, Fish allergy, Methotrexate derivatives, Latex, Penicillins, and Sulfa antibiotics  Review of Systems   Review of Systems  Constitutional: Negative for chills and fever.  HENT: Negative.   Respiratory: Positive for shortness of breath. Negative for cough.   Cardiovascular: Negative for chest pain.  Gastrointestinal: Negative for abdominal pain and vomiting.  Genitourinary: Negative for dysuria.  Musculoskeletal: Positive for arthralgias, back pain and myalgias.  Skin: Negative for color change and rash.  All other systems reviewed and are negative.   Physical Exam Updated Vital Signs BP 140/69   Pulse 60   Temp 98.7 F (37.1 C) (Oral)   Resp 16   Ht 5\' 5"  (1.651 m)   Wt 74.4 kg   SpO2 96%   BMI 27.29 kg/m   Physical Exam Vitals and nursing note reviewed.  Constitutional:      General: She is not in acute distress.    Appearance: She is  well-developed. She is not diaphoretic.     Comments: Chronically ill appearing but in no acute distress  HENT:     Head: Normocephalic and atraumatic.  Eyes:     General:        Right eye: No discharge.        Left eye: No discharge.     Pupils: Pupils are equal, round, and reactive to light.  Cardiovascular:     Rate and Rhythm: Normal rate and regular rhythm.     Pulses: Normal pulses.          Radial pulses are 2+ on the right side and 2+ on the left side.       Dorsalis pedis pulses are 2+ on the right side and 2+ on the left side.     Heart sounds: Normal heart sounds. No murmur heard. No friction rub.  Pulmonary:     Effort: Pulmonary effort is normal. No respiratory distress.     Breath sounds: Normal breath sounds. No wheezing or rales.     Comments: Respirations equal and unlabored, patient able to speak in full sentences, lungs clear to auscultation bilaterally  Abdominal:     General: Bowel sounds are normal. There is no distension.     Palpations: Abdomen is soft. There is no mass.     Tenderness: There is no abdominal tenderness. There is no guarding.     Comments: Abdomen soft, nondistended, nontender to palpation in all quadrants without guarding or peritoneal signs   Musculoskeletal:        General: No deformity.     Cervical back: Neck supple.     Comments: Some tenderness over the left low back, no focal midline tenderness. Tenderness over the left lower leg without appreciable swelling, no redness or warmth, no focal joint swelling. Patient with some mild diffuse pain. All compartments soft  Skin:    General: Skin is warm and dry.     Capillary Refill: Capillary refill takes less than 2 seconds.  Neurological:  Mental Status: She is alert.     Coordination: Coordination normal.     Comments: Speech is clear, able to follow commands CN III-XII intact Normal strength in upper and lower extremities bilaterally including dorsiflexion and plantar flexion, strong  and equal grip strength Sensation normal to light and sharp touch Moves extremities without ataxia, coordination intact   Psychiatric:        Mood and Affect: Mood normal.        Behavior: Behavior normal.     ED Results / Procedures / Treatments   Labs (all labs ordered are listed, but only abnormal results are displayed) Labs Reviewed  CBC WITH DIFFERENTIAL/PLATELET - Abnormal; Notable for the following components:      Result Value   Platelets 134 (*)    Neutro Abs 1.3 (*)    All other components within normal limits  BASIC METABOLIC PANEL    EKG EKG Interpretation  Date/Time:  Wednesday October 09 2020 16:17:32 EDT Ventricular Rate:  51 PR Interval:  173 QRS Duration: 92 QT Interval:  448 QTC Calculation: 413 R Axis:   9 Text Interpretation: Sinus rhythm Atrial premature complex No significant change since last tracing 25 February 2020 Confirmed by [name] [phone]) on 10/10/2020 1:00:33 PM   Radiology DG Chest Port 1 View  Result Date: 10/09/2020 CLINICAL DATA:  Shortness of breath. Patient reports generalized body pain for 2-3 months with bilateral leg swelling. EXAM: PORTABLE CHEST 1 VIEW COMPARISON:  Radiograph 02/25/2020 FINDINGS: Unchanged mediastinal contours. Upper normal heart size likely accentuated by portable AP technique. No pulmonary edema, pleural effusion, or pneumothorax. Calcified granuloma in the left mid lung. Minor right basilar atelectasis. Surgical clips at the thoracic inlet. No acute osseous abnormalities are seen. IMPRESSION: Minor right basilar atelectasis. Electronically Signed   By: Keith Rake M.D.   On: 10/09/2020 16:50   VAS [place] LOWER EXTREMITY VENOUS (DVT) ([facility] and [facility] 7a-7p)  Result Date: 10/09/2020  Lower Venous DVT Study Indications: Pain.  Risk Factors: None identified. Comparison Study: No prior studies. Performing Technologist: Oliver Hum RVT  Examination Guidelines: A complete evaluation includes B-mode imaging, spectral  Doppler, color Doppler, and power Doppler as needed of all accessible portions of each vessel. Bilateral testing is considered an integral part of a complete examination. Limited examinations for reoccurring indications may be performed as noted. The reflux portion of the exam is performed with the patient in reverse Trendelenburg.  +-----+---------------+---------+-----------+----------+--------------+ RIGHTCompressibilityPhasicitySpontaneityPropertiesThrombus Aging +-----+---------------+---------+-----------+----------+--------------+ CFV  Full           Yes      Yes                                 +-----+---------------+---------+-----------+----------+--------------+   +---------+---------------+---------+-----------+----------+--------------+ LEFT     CompressibilityPhasicitySpontaneityPropertiesThrombus Aging +---------+---------------+---------+-----------+----------+--------------+ CFV      Full           Yes      Yes                                 +---------+---------------+---------+-----------+----------+--------------+ SFJ      Full                                                        +---------+---------------+---------+-----------+----------+--------------+  FV Prox  Full                                                        +---------+---------------+---------+-----------+----------+--------------+ FV Mid   Full                                                        +---------+---------------+---------+-----------+----------+--------------+ FV DistalFull                                                        +---------+---------------+---------+-----------+----------+--------------+ PFV      Full                                                        +---------+---------------+---------+-----------+----------+--------------+ POP      Full           Yes      Yes                                  +---------+---------------+---------+-----------+----------+--------------+ PTV      Full                                                        +---------+---------------+---------+-----------+----------+--------------+ PERO     Full                                                        +---------+---------------+---------+-----------+----------+--------------+     Summary: RIGHT: - No evidence of common femoral vein obstruction.  LEFT: - There is no evidence of deep vein thrombosis in the lower extremity.  - No cystic structure found in the popliteal fossa.  *See table(s) above for measurements and observations. Electronically signed by Monica Martinez MD on 10/09/2020 at 5:46:23 PM.    Final       Procedures Procedures   Medications Ordered in ED Medications  morphine 4 MG/ML injection 4 mg (4 mg Intravenous Given 10/09/20 1618)  ondansetron (ZOFRAN) injection 4 mg (4 mg Intravenous Given 10/09/20 1618)  methocarbamol (ROBAXIN) tablet 500 mg (500 mg Oral Given 10/09/20 1618)    ED Course  I have reviewed the triage vital signs and the nursing notes.  Pertinent labs & imaging results that were available during my care of the patient were reviewed by me and considered in my medical decision making (see chart for details).    MDM Rules/Calculators/A&P  70 y.o. female presents to the ED with complaints of 2-3 months of generalized body pain, with most severe pain present in her left leg, this involves an extensive number of treatment options, and is a complaint that carries with it a high risk of complications and morbidity.   On arrival pt is nontoxic, vitals WNL. Exam significant for some mild tenderness in the left lower leg but no swelling redness or warmth, no apparent deformity, vascularly intact,  Additional history obtained from chart review. Previous records obtained and reviewed via EMR  I ordered pain medication for left leg pain/generalized  body pain  Lab Tests:  I Ordered, reviewed, and interpreted labs, which included:  CBC: No leukocytosis, normal hemoglobin BMP: No electrolyte derangements, normal renal function  EKG shows normal sinus rhythm without concerning changes  Imaging Studies ordered:  I ordered imaging studies which included DVT study, chest x-ray, I independently visualized and interpreted imaging which showed no active cardiopulmonary disease and no evidence of DVT  ED Course:   Discussed reassuring work-up with patient, no evidence of DVT or lab derangements.  It seems that the majority of patient's complaints are more chronic.  At this time there does not appear to be any evidence of an acute emergency medical condition and the patient appears stable for discharge with appropriate outpatient follow up.Diagnosis was discussed with patient who verbalizes understanding and is agreeable to discharge. Pt case discussed with Dr. Francia Greaves who agrees with my plan.     Portions of this note were generated with [employer]. Dictation errors may occur despite best attempts at proofreading.   Final Clinical Impression(s) / ED Diagnoses Final diagnoses:  Left leg pain  Myalgia    Rx / DC Orders ED Discharge Orders    None       Janet Berlin 10/13/20 2224    Valarie Merino, MD 10/17/20 1021

## 2020-10-09 NOTE — ED Triage Notes (Signed)
Patient complains of generalized body pain x2-3 months and bilateral leg swelling, upper back pain and pain w/ deep inspiration x2-3 weeks. L leg pain is the worst, denies any recent trauma. Patient took 0.5 of a percocet for the pain, took it down to 7.5/10.

## 2020-10-09 NOTE — Progress Notes (Signed)
Left lower extremity venous duplex has been completed. Preliminary results can be found in CV Proc through chart review.  Results were given to Benedetto Goad PA.  10/09/20 4:19 PM Carlos Levering RVT

## 2020-10-09 NOTE — ED Notes (Signed)
Patient assisted to and from the bathroom at this time

## 2022-01-12 ENCOUNTER — Emergency Department (HOSPITAL_COMMUNITY): Payer: Medicare Other

## 2022-01-12 ENCOUNTER — Encounter (HOSPITAL_COMMUNITY): Payer: Self-pay | Admitting: Emergency Medicine

## 2022-01-12 ENCOUNTER — Emergency Department (HOSPITAL_COMMUNITY)
Admission: EM | Admit: 2022-01-12 | Discharge: 2022-01-13 | Discharge status: Against Medical Advice | Payer: Medicare Other | Attending: Emergency Medicine | Admitting: Emergency Medicine

## 2022-01-12 DIAGNOSIS — R519 Headache, unspecified: Secondary | ICD-10-CM | POA: Insufficient documentation

## 2022-01-12 DIAGNOSIS — R079 Chest pain, unspecified: Secondary | ICD-10-CM | POA: Diagnosis not present

## 2022-01-12 DIAGNOSIS — M25551 Pain in right hip: Secondary | ICD-10-CM | POA: Diagnosis not present

## 2022-01-12 DIAGNOSIS — Z5321 Procedure and treatment not carried out due to patient leaving prior to being seen by health care provider: Secondary | ICD-10-CM | POA: Diagnosis not present

## 2022-01-12 DIAGNOSIS — M25511 Pain in right shoulder: Secondary | ICD-10-CM | POA: Insufficient documentation

## 2022-01-12 LAB — TROPONIN I (HIGH SENSITIVITY): Troponin I (High Sensitivity): 5 ng/L (ref <18)
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
## 2022-01-12 NOTE — ED Triage Notes (Signed)
Pt denies CP at this time. Endorses HA and right sided hip pain. States she had a panic attack yesterday.

## 2022-01-12 NOTE — ED Notes (Signed)
Called pt x2 for vitals, no response. °

## 2022-01-13 NOTE — ED Notes (Signed)
EMS IV removed by this Probation officer. Patient states she is leaving

## 2022-01-13 NOTE — ED Notes (Signed)
Patient states "again?" When name was called for vitals recheck. States "na that's alright I dont want them."

## 2022-02-03 ENCOUNTER — Other Ambulatory Visit: Payer: Self-pay

## 2022-02-03 ENCOUNTER — Emergency Department (HOSPITAL_COMMUNITY): Payer: Medicare Other

## 2022-02-03 ENCOUNTER — Ambulatory Visit (HOSPITAL_COMMUNITY)
Admission: EM | Admit: 2022-02-03 | Discharge: 2022-02-03 | Disposition: A | Discharge status: Home / Self Care | Payer: Medicare Other

## 2022-02-03 ENCOUNTER — Emergency Department (HOSPITAL_COMMUNITY)
Admission: EM | Admit: 2022-02-03 | Discharge: 2022-02-03 | Disposition: A | Discharge status: Home / Self Care | Payer: Medicare Other | Attending: Emergency Medicine | Admitting: Emergency Medicine

## 2022-02-03 ENCOUNTER — Encounter (HOSPITAL_COMMUNITY): Payer: Self-pay | Admitting: Emergency Medicine

## 2022-02-03 ENCOUNTER — Encounter (HOSPITAL_COMMUNITY): Payer: Self-pay

## 2022-02-03 DIAGNOSIS — S161XXA Strain of muscle, fascia and tendon at neck level, initial encounter: Secondary | ICD-10-CM | POA: Diagnosis not present

## 2022-02-03 DIAGNOSIS — R0789 Other chest pain: Secondary | ICD-10-CM | POA: Insufficient documentation

## 2022-02-03 DIAGNOSIS — M549 Dorsalgia, unspecified: Secondary | ICD-10-CM

## 2022-02-03 DIAGNOSIS — I10 Essential (primary) hypertension: Secondary | ICD-10-CM | POA: Insufficient documentation

## 2022-02-03 DIAGNOSIS — R519 Headache, unspecified: Secondary | ICD-10-CM | POA: Insufficient documentation

## 2022-02-03 DIAGNOSIS — Z79899 Other long term (current) drug therapy: Secondary | ICD-10-CM | POA: Insufficient documentation

## 2022-02-03 DIAGNOSIS — M79662 Pain in left lower leg: Secondary | ICD-10-CM | POA: Insufficient documentation

## 2022-02-03 DIAGNOSIS — M546 Pain in thoracic spine: Secondary | ICD-10-CM | POA: Diagnosis not present

## 2022-02-03 DIAGNOSIS — Z85850 Personal history of malignant neoplasm of thyroid: Secondary | ICD-10-CM | POA: Diagnosis not present

## 2022-02-03 DIAGNOSIS — Z91040 Latex allergy status: Secondary | ICD-10-CM | POA: Diagnosis not present

## 2022-02-03 DIAGNOSIS — S199XXA Unspecified injury of neck, initial encounter: Secondary | ICD-10-CM | POA: Diagnosis present

## 2022-02-03 HISTORY — DX: Osteogenesis imperfecta: Q78.0

## 2022-02-03 HISTORY — DX: Age-related osteoporosis without current pathological fracture: M81.0

## 2022-02-03 MED ORDER — LIDOCAINE 5 % EX PTCH
1.0000 | MEDICATED_PATCH | CUTANEOUS | Status: DC
  Administered 2022-02-03: 1 via TRANSDERMAL
  Filled 2022-02-03: qty 1

## 2022-02-03 MED ORDER — ACETAMINOPHEN 500 MG PO TABS
1000.0000 mg | ORAL_TABLET | ORAL | Status: DC
  Filled 2022-02-03: qty 2
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
## 2022-02-03 NOTE — Discharge Instructions (Signed)
D/c to ED
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
## 2022-05-22 ENCOUNTER — Encounter (HOSPITAL_COMMUNITY): Payer: Self-pay

## 2022-05-22 ENCOUNTER — Other Ambulatory Visit: Payer: Self-pay

## 2022-05-22 ENCOUNTER — Emergency Department (HOSPITAL_COMMUNITY)
Admission: EM | Admit: 2022-05-22 | Discharge: 2022-05-22 | Discharge status: Against Medical Advice | Payer: Medicare Other | Attending: Emergency Medicine | Admitting: Emergency Medicine

## 2022-05-22 ENCOUNTER — Emergency Department (HOSPITAL_COMMUNITY): Payer: Medicare Other

## 2022-05-22 DIAGNOSIS — R079 Chest pain, unspecified: Secondary | ICD-10-CM | POA: Diagnosis present

## 2022-05-22 DIAGNOSIS — R0602 Shortness of breath: Secondary | ICD-10-CM | POA: Diagnosis not present

## 2022-05-22 DIAGNOSIS — Z5321 Procedure and treatment not carried out due to patient leaving prior to being seen by health care provider: Secondary | ICD-10-CM | POA: Diagnosis not present

## 2022-05-22 LAB — CBC
HCT: 42 % (ref 36.0–46.0)
Hemoglobin: 13.4 g/dL (ref 12.0–15.0)
MCH: 27.5 pg (ref 26.0–34.0)
MCHC: 31.9 g/dL (ref 30.0–36.0)
MCV: 86.2 fL (ref 80.0–100.0)
Platelets: 146 10*3/uL — ABNORMAL LOW (ref 150–400)
RBC: 4.87 MIL/uL (ref 3.87–5.11)
RDW: 14.6 % (ref 11.5–15.5)
WBC: 6.2 10*3/uL (ref 4.0–10.5)
nRBC: 0 % (ref 0.0–0.2)

## 2022-05-22 LAB — BASIC METABOLIC PANEL
Anion gap: 5 (ref 5–15)
BUN: 8 mg/dL (ref 8–23)
CO2: 27 mmol/L (ref 22–32)
Calcium: 8.9 mg/dL (ref 8.9–10.3)
Chloride: 108 mmol/L (ref 98–111)
Creatinine, Ser: 0.7 mg/dL (ref 0.44–1.00)
GFR, Estimated: >60 mL/min (ref >60)
Glucose, Bld: 77 mg/dL (ref 70–99)
Potassium: 3.3 mmol/L — ABNORMAL LOW (ref 3.5–5.1)
Sodium: 140 mmol/L (ref 135–145)

## 2022-05-22 LAB — TROPONIN I (HIGH SENSITIVITY): Troponin I (High Sensitivity): 4 ng/L (ref <18)

## 2022-05-22 NOTE — ED Provider Triage Note (Signed)
Emergency Medicine Provider Triage Evaluation Note  Denise Simmons , a 71 y.o. female  was evaluated in triage.  Pt complains of left-sided chest pain.  Patient states it feels like an elephant is sitting on her chest.  She also admits to right upper extremity numbness/tingling.  Chest pain has been present for the past week associate with shortness of breath.  Review of Systems  Positive: CP, SOB, numbness Negative: fever  Physical Exam  BP (!) 162/80 (BP Location: Right Arm)   Pulse (!) 54   Temp 98.7 F (37.1 C) (Oral)   Resp 16   Ht '5\' 5"'$  (1.651 m)   Wt 69.9 kg   SpO2 100%   BMI 25.63 kg/m  Gen:   Awake, no distress   Resp:  Normal effort  MSK:   Moves extremities without difficulty  Other:    Medical Decision Making  Medically screening exam initiated at 3:55 PM.  Appropriate orders placed.  Denise Simmons was informed that the remainder of the evaluation will be completed by another provider, this initial triage assessment does not replace that evaluation, and the importance of remaining in the ED until their evaluation is complete.  Cardiac labs EKG CXR   Denise Simmons 05/22/22 1556

## 2022-05-22 NOTE — ED Triage Notes (Signed)
Patient c/o constant left chest pain and right arm pain xx 1 week. Patient states she had nausea when the chest pain first started. Patient denies SOB.

## 2022-07-02 ENCOUNTER — Emergency Department (HOSPITAL_COMMUNITY)
Admission: EM | Admit: 2022-07-02 | Discharge: 2022-07-02 | Discharge status: Against Medical Advice | Payer: Medicare Other | Attending: Emergency Medicine | Admitting: Emergency Medicine

## 2022-07-02 ENCOUNTER — Encounter (HOSPITAL_COMMUNITY): Payer: Self-pay

## 2022-07-02 ENCOUNTER — Other Ambulatory Visit: Payer: Self-pay

## 2022-07-02 ENCOUNTER — Emergency Department (HOSPITAL_COMMUNITY): Payer: Medicare Other

## 2022-07-02 DIAGNOSIS — J029 Acute pharyngitis, unspecified: Secondary | ICD-10-CM | POA: Insufficient documentation

## 2022-07-02 DIAGNOSIS — Z1152 Encounter for screening for COVID-19: Secondary | ICD-10-CM | POA: Diagnosis not present

## 2022-07-02 DIAGNOSIS — R059 Cough, unspecified: Secondary | ICD-10-CM | POA: Insufficient documentation

## 2022-07-02 DIAGNOSIS — Z5321 Procedure and treatment not carried out due to patient leaving prior to being seen by health care provider: Secondary | ICD-10-CM | POA: Diagnosis not present

## 2022-07-02 DIAGNOSIS — J449 Chronic obstructive pulmonary disease, unspecified: Secondary | ICD-10-CM | POA: Insufficient documentation

## 2022-07-02 DIAGNOSIS — R0602 Shortness of breath: Secondary | ICD-10-CM | POA: Diagnosis not present

## 2022-07-02 LAB — RESP PANEL BY RT-PCR (RSV, FLU A&B, COVID)  RVPGX2
Influenza A by PCR: NEGATIVE
Influenza B by PCR: NEGATIVE
Resp Syncytial Virus by PCR: NEGATIVE
SARS Coronavirus 2 by RT PCR: NEGATIVE
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
## 2022-12-11 ENCOUNTER — Emergency Department (HOSPITAL_COMMUNITY)
Admission: EM | Admit: 2022-12-11 | Discharge: 2022-12-11 | Disposition: A | Discharge status: Home / Self Care | Payer: 59 | Attending: Emergency Medicine | Admitting: Emergency Medicine

## 2022-12-11 ENCOUNTER — Encounter (HOSPITAL_COMMUNITY): Payer: Self-pay | Admitting: Emergency Medicine

## 2022-12-11 ENCOUNTER — Emergency Department (HOSPITAL_COMMUNITY): Payer: 59

## 2022-12-11 ENCOUNTER — Other Ambulatory Visit: Payer: Self-pay

## 2022-12-11 DIAGNOSIS — J449 Chronic obstructive pulmonary disease, unspecified: Secondary | ICD-10-CM | POA: Diagnosis not present

## 2022-12-11 DIAGNOSIS — G8929 Other chronic pain: Secondary | ICD-10-CM | POA: Insufficient documentation

## 2022-12-11 DIAGNOSIS — I10 Essential (primary) hypertension: Secondary | ICD-10-CM | POA: Insufficient documentation

## 2022-12-11 DIAGNOSIS — Z91040 Latex allergy status: Secondary | ICD-10-CM | POA: Insufficient documentation

## 2022-12-11 DIAGNOSIS — Z85850 Personal history of malignant neoplasm of thyroid: Secondary | ICD-10-CM | POA: Insufficient documentation

## 2022-12-11 DIAGNOSIS — G039 Meningitis, unspecified: Secondary | ICD-10-CM | POA: Diagnosis not present

## 2022-12-11 DIAGNOSIS — M545 Low back pain, unspecified: Secondary | ICD-10-CM | POA: Diagnosis present

## 2022-12-11 LAB — CBC WITH DIFFERENTIAL/PLATELET
Abs Immature Granulocytes: 0.01 10*3/uL (ref 0.00–0.07)
Basophils Absolute: 0 10*3/uL (ref 0.0–0.1)
Basophils Relative: 1 %
Eosinophils Absolute: 0.2 10*3/uL (ref 0.0–0.5)
Eosinophils Relative: 4 %
HCT: 39.7 % (ref 36.0–46.0)
Hemoglobin: 12.8 g/dL (ref 12.0–15.0)
Immature Granulocytes: 0 %
Lymphocytes Relative: 49 %
Lymphs Abs: 2.6 10*3/uL (ref 0.7–4.0)
MCH: 27.3 pg (ref 26.0–34.0)
MCHC: 32.2 g/dL (ref 30.0–36.0)
MCV: 84.6 fL (ref 80.0–100.0)
Monocytes Absolute: 0.4 10*3/uL (ref 0.1–1.0)
Monocytes Relative: 7 %
Neutro Abs: 2.1 10*3/uL (ref 1.7–7.7)
Neutrophils Relative %: 39 %
Platelets: 112 10*3/uL — ABNORMAL LOW (ref 150–400)
RBC: 4.69 MIL/uL (ref 3.87–5.11)
RDW: 14.2 % (ref 11.5–15.5)
WBC: 5.3 10*3/uL (ref 4.0–10.5)
nRBC: 0 % (ref 0.0–0.2)

## 2022-12-11 LAB — BASIC METABOLIC PANEL
Anion gap: 8 (ref 5–15)
BUN: 13 mg/dL (ref 8–23)
CO2: 22 mmol/L (ref 22–32)
Calcium: 9 mg/dL (ref 8.9–10.3)
Chloride: 108 mmol/L (ref 98–111)
Creatinine, Ser: 0.93 mg/dL (ref 0.44–1.00)
GFR, Estimated: >60 mL/min (ref >60)
Glucose, Bld: 84 mg/dL (ref 70–99)
Potassium: 3.7 mmol/L (ref 3.5–5.1)
Sodium: 138 mmol/L (ref 135–145)

## 2022-12-11 MED ORDER — ONDANSETRON HCL 4 MG PO TABS: 4.0000 mg | ORAL_TABLET | Freq: Three times a day (TID) | ORAL | 0 refills | Status: AC | PRN

## 2022-12-11 MED ORDER — HYDROMORPHONE HCL 1 MG/ML IJ SOLN
1.0000 mg | Freq: Once | INTRAMUSCULAR | Status: AC
  Administered 2022-12-11: 1 mg via INTRAVENOUS
  Filled 2022-12-11: qty 1

## 2022-12-11 MED ORDER — IOHEXOL 350 MG/ML SOLN
75.0000 mL | Freq: Once | INTRAVENOUS | Status: AC | PRN
  Administered 2022-12-11: 75 mL via INTRAVENOUS

## 2022-12-11 MED ORDER — GADOBUTROL 1 MMOL/ML IV SOLN
6.5000 mL | Freq: Once | INTRAVENOUS | Status: AC | PRN
  Administered 2022-12-11: 6.5 mL via INTRAVENOUS

## 2022-12-11 MED ORDER — MORPHINE SULFATE (PF) 4 MG/ML IV SOLN
4.0000 mg | Freq: Once | INTRAVENOUS | Status: AC
  Administered 2022-12-11: 4 mg via INTRAVENOUS
  Filled 2022-12-11: qty 1

## 2022-12-11 MED ORDER — ONDANSETRON HCL 4 MG/2ML IJ SOLN
4.0000 mg | Freq: Once | INTRAMUSCULAR | Status: AC
  Administered 2022-12-11: 4 mg via INTRAVENOUS
  Filled 2022-12-11: qty 2

## 2022-12-11 MED ORDER — LORAZEPAM 1 MG PO TABS
1.0000 mg | ORAL_TABLET | Freq: Once | ORAL | Status: AC
  Administered 2022-12-11: 1 mg via ORAL
  Filled 2022-12-11: qty 1

## 2022-12-11 NOTE — Discharge Instructions (Signed)
Thank you for letting us take care of you.  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 We spoke with neurology and they want to see you in the office for further testing and treatment.  I referred you to neurology.  You should receive a phone call from them within 72 hours to set up a follow-up appointment.  If you do not hear from them, contact their office using the information provided to schedule this appointment.  Follow-up with your PCP early next week.  If you do not have a PCP, I provided 2 clinics that you may follow-up with.  I want you to be rechecked by them next week and also to discuss need for outpatient pain management if you do not want to continue with the clinic that you were previously seeing.   I prescribe Zofran should you have any issues with nausea at home.  Continue your other home medications.  For any new or worsening symptoms, please return to the nearest ED for reevaluation.

## 2022-12-11 NOTE — ED Provider Notes (Signed)
[place] EMERGENCY DEPARTMENT AT [facility] Provider Note   CSN: [id number] Arrival date & time: 12/11/22  1151     History  Chief Complaint  Patient presents with   Back Pain    Denise Simmons is a 72 y.o. female with past medical history hypertension, hyperlipidemia, COPD, chronic pain, thyroid cancer in remission for 5 years who presents to the ED complaining of severe lower back pain.  She states that this started about 2 months ago.  She denies any fall or injury to the back.  States that it has been severe in nature.  She was previously followed by pain management but notes that she no longer is able to see them and her primary care provider also left the facility.  She was previously on Percocet for chronic pain.  She states that currently she is seeing an NP at a family medicine office but "they are unable to prescribe me any medicine."  She is scheduled for an outpatient MRI next month.  Patient admits that recently she feels like she is losing a lot of weight like she did when she was diagnosed with thyroid cancer 5 years ago.  She denies associated fever, bowel or bladder dysfunction, abdominal pain, chest pain, nausea, vomiting.  She states she feels weak in her left leg compared to the right and has been difficult to walk due to this.  Also states that she has some numbness and tingling down the left lower extremity.  Says that though she does have chronic back pain she did not previously have these symptoms.      Home Medications Prior to Admission medications   Medication Sig Start Date End Date Taking? Authorizing Provider  ondansetron (ZOFRAN) 4 MG tablet Take 1 tablet (4 mg total) by mouth every 8 (eight) hours as needed for up to 3 days for nausea or vomiting. 12/11/22 12/14/22 Yes Johncharles Fusselman L, PA-C  adalimumab (HUMIRA PEN) 40 MG/0.8ML injection Inject 40 mg into the skin every 14 (fourteen) days. Patient not taking: Reported on 04/26/2020     [provider]  albuterol (PROVENTIL HFA;VENTOLIN HFA) 108 (90 BASE) MCG/ACT inhaler Inhale 2 puffs into the lungs 3 (three) times daily as needed. For shortness of breath     [provider]  albuterol (PROVENTIL) (2.5 MG/3ML) 0.083% nebulizer solution Take 2.5 mg by nebulization every 6 (six) hours as needed for wheezing or shortness of breath.    [provider]  bisoprolol (ZEBETA) 5 MG tablet Take 5 mg by mouth daily.    Nyoka Cowden, MD  cetirizine (ZYRTEC) 10 MG tablet Take 10 mg by mouth daily.    [provider]  colchicine 0.6 MG tablet Take 0.6 mg by mouth 2 (two) times daily. 05/17/18   [provider]  cyclobenzaprine (FLEXERIL) 10 MG tablet Take 1 tablet (10 mg total) by mouth 2 (two) times daily as needed for muscle spasms. Patient not taking: Reported on 02/03/2022 03/10/16   Fayrene Helper, PA-C  diazepam (VALIUM) 5 MG tablet Take 5 mg by mouth daily as needed for anxiety or sleep.    [provider]  Emollient (CERAVE) CREA Apply 1 application topically 2 (two) times daily as needed (break outs).     [provider]  esomeprazole (NEXIUM) 40 MG capsule Take 40 mg by mouth 2 (two) times daily.      [provider]  famotidine (PEPCID) 20 MG tablet Take 20 mg by mouth at bedtime.  With nexium    [provider]  fluconazole (DIFLUCAN) 150 MG tablet Take 150 mg by mouth once a week. 05/17/18   [provider]  fluocinonide ointment (LIDEX) 0.05 % Apply 1 application topically 2 (two) times daily. 12/06/19   [provider]  levothyroxine (SYNTHROID, LEVOTHROID) 112 MCG tablet Take 112 mcg by mouth daily. 03/04/18   [provider]  lidocaine (XYLOCAINE) 2 % solution Take 5 mLs by mouth daily as needed for mouth pain.  04/18/18   [provider]  montelukast (SINGULAIR) 10 MG tablet Take 10 mg by mouth at bedtime.      [provider]  nystatin (MYCOSTATIN) 100000  UNIT/ML suspension Take 30 mLs by mouth daily as needed (mouth pain).  06/04/17   [provider]  Olopatadine HCl (PATADAY) 0.2 % SOLN Place 1 drop into both eyes 2 (two) times daily.     [provider]  oxyCODONE-acetaminophen (PERCOCET) 10-325 MG per tablet Take 0.5 tablets by mouth every 6 (six) hours as needed. For pain 05/12/12   Gerhard Munch, MD  sodium chloride (OCEAN) 0.65 % SOLN nasal spray Place 1 spray into both nostrils as needed for congestion. Patient not taking: Reported on 04/26/2020 10/02/16   Jaynie Crumble, PA-C  topiramate (TOPAMAX) 100 MG tablet Take 1 tablet (100 mg total) by mouth 2 (two) times daily. 07/09/16   Levert Feinstein, MD  triamcinolone cream (KENALOG) 0.1 % Apply topically 3 (three) times daily. 08/22/12   Reuben Likes, MD      Allergies    Celebrex [celecoxib], Doxycycline, Fentanyl, Lisinopril, Lyrica [pregabalin], Naproxen, Nsaids, Prednisone, Sumatriptan, Estradiol, Fish allergy, Methotrexate derivatives, Latex, Penicillins, and Sulfa antibiotics    Review of Systems   Review of Systems  All other systems reviewed and are negative.   Physical Exam Updated Vital Signs BP (!) 143/86 (BP Location: Right Arm)   Pulse (!) 56   Temp 98.3 F (36.8 C) (Oral)   Resp 17   Ht 5\' 5"  (1.651 m)   Wt 65.3 kg   SpO2 99%   BMI 23.96 kg/m  Physical Exam Vitals and nursing note reviewed.  Constitutional:      General: She is in acute distress (mild secondary to pain).     Appearance: Normal appearance. She is not ill-appearing, toxic-appearing or diaphoretic.  HENT:     Head: Normocephalic and atraumatic.     Mouth/Throat:     Mouth: Mucous membranes are moist.  Eyes:     General: No scleral icterus.    Extraocular Movements: Extraocular movements intact.     Conjunctiva/sclera: Conjunctivae normal.  Neck:     Comments: No meningismus Cardiovascular:     Rate and Rhythm: Normal rate and regular rhythm.     Heart sounds: No murmur  heard. Pulmonary:     Effort: Pulmonary effort is normal.     Breath sounds: Normal breath sounds.  Abdominal:     General: Abdomen is flat. There is no distension.     Palpations: Abdomen is soft.     Tenderness: There is no abdominal tenderness. There is no right CVA tenderness, left CVA tenderness, guarding or rebound.  Musculoskeletal:        General: No deformity.     Cervical back: Normal range of motion and neck supple. No rigidity.     Right lower leg: No edema.     Left lower leg: No edema.     Comments: No midline CT spinal tenderness, stepoffs,  or deformities; moderate tenderness to midline and left paraspinal lumbar spine without stepoffs or deformities, moves all extremities spontaneously  Skin:    General: Skin is warm and dry.     Capillary Refill: Capillary refill takes less than 2 seconds.  Neurological:     Mental Status: She is alert and oriented to person, place, and time.     GCS: GCS eye subscore is 4. GCS verbal subscore is 5. GCS motor subscore is 6.     Cranial Nerves: Cranial nerves 2-12 are intact. No cranial nerve deficit, dysarthria or facial asymmetry.     Sensory: Sensory deficit (subjective diminished to light touch to LLE compared to RLE) present.     Motor: Weakness (4/5 LLE compared to RLE and favoring right with ambulation, 5/5 bilateral UE symmetric) present. No tremor, atrophy, abnormal muscle tone, seizure activity or pronator drift.     Deep Tendon Reflexes:     Reflex Scores:      Patellar reflexes are 2+ on the right side and 2+ on the left side. Psychiatric:        Behavior: Behavior normal.     ED Results / Procedures / Treatments   Labs (all labs ordered are listed, but only abnormal results are displayed) Labs Reviewed  CBC WITH DIFFERENTIAL/PLATELET - Abnormal; Notable for the following components:      Result Value   Platelets 112 (*)    All other components within normal limits  BASIC METABOLIC PANEL  URINALYSIS, ROUTINE W  REFLEX MICROSCOPIC    EKG None  Radiology MR THORACIC SPINE W WO CONTRAST  Result Date: 12/11/2022 CLINICAL DATA:  Back pain EXAM: MRI THORACIC AND LUMBAR SPINE WITHOUT AND WITH CONTRAST TECHNIQUE: Multiplanar and multiecho pulse sequences of the thoracic and lumbar spine were obtained without and with intravenous contrast. CONTRAST:  6.40mL GADAVIST GADOBUTROL 1 MMOL/ML IV SOLN COMPARISON:  05/26/2020 MRI lumbar spine, no prior MRI thoracic spine available; correlation is made with CT thoracic and lumbar spine 12/11/2022 FINDINGS: MRI THORACIC SPINE FINDINGS Alignment: No significant listhesis. Dextrocurvature of the thoracic spine. Vertebrae: No acute fracture, suspicious osseous lesion, or evidence of discitis. T1 and T2 hyperintense lesion at the anterior aspect of T6, compatible with a benign hemangioma. Cord:  Normal signal and morphology.  No abnormal enhancement. Paraspinal and other soft tissues: Negative. Disc levels: T1-T2: Mild disc bulge. Mild facet arthropathy. No spinal canal stenosis. Mild left-greater-than-right neural foraminal narrowing. T9-T10: No significant disc bulge. Facet arthropathy. No spinal canal stenosis. Mild bilateral neural foraminal narrowing. T10-T11: Left paracentral/subarticular disc protrusion. Moderate facet arthropathy. Moderate spinal canal stenosis. Mild bilateral neural foraminal narrowing. MRI LUMBAR SPINE FINDINGS Segmentation: 5 lumbar type vertebral bodies. The last fully formed disc space is labeled L5-S1. Alignment: Trace retrolisthesis of L2 on L3 and trace anterolisthesis of L3 on L4, unchanged. S-shaped curvature of the thoracolumbar spine. Straightening of the normal lumbar lordosis. Vertebrae: Chronic appearing compression deformity of L2, with up to 30% vertebral body height loss anteriorly, and a superimposed Schmorl's node forming at the superior endplate, which is associated with edema and enhancement. Edema is also noted in the L1-L2 disc, but no  abnormal enhancement is noted in the disc. No acute fracture, suspicious osseous lesion, or evidence of discitis. Endplate degenerative changes at L4-L5, eccentric to the right, and L5-S1, eccentric to the left. No abnormal enhancement. Conus medullaris: Extends to the L2 level and appears normal. Clumping of the nerve roots inferior to L3-L4 (series 14, image 21). Cauda  equina otherwise unremarkable. No abnormal nerve root enhancement. Paraspinal and other soft tissues: Negative. Disc levels: T12-L1: No significant disc bulge. No spinal canal stenosis or neural foraminal narrowing. L1-L2: Mild disc bulge. No spinal canal stenosis or neural foraminal narrowing. L2-L3: Trace retrolisthesis and moderate disc bulge. Mild facet arthropathy. Ligamentum flavum hypertrophy. Effacement of the lateral recesses. Moderate spinal canal stenosis, which has progressed from the prior exam. Mild right and mild-to-moderate left neural foraminal narrowing, which have progressed from the prior exam. L3-L4: Trace anterolisthesis with left eccentric disc bulge, with left foraminal and extreme lateral protrusion. Moderate facet arthropathy. Ligamentum flavum hypertrophy. Effacement of the lateral recesses. Moderate spinal canal stenosis, which appears slightly progressed from prior exam. Severe left and mild right neural foraminal narrowing, unchanged. L4-L5: Disc height loss and mild disc bulge with right subarticular protrusion, which narrows the right lateral recess. Mild facet arthropathy. No spinal canal stenosis. Severe right and moderate left neural foraminal narrowing, unchanged. L5-S1: Disc height loss and moderate disc bulge, with left subarticular protrusion, which narrows the left lateral recess. Mild facet arthropathy. No spinal canal stenosis. Severe bilateral neural foraminal narrowing, which has progressed from the prior exam. IMPRESSION: 1. T10-T11 moderate spinal canal stenosis and mild bilateral neural foraminal  narrowing. 2. L2-L3 moderate spinal canal stenosis with mild right and mild-to-moderate left neural foraminal narrowing. 3. L3-L4 moderate spinal canal stenosis with severe left and mild right neural foraminal narrowing. 4. L4-L5 severe right and moderate left neural foraminal narrowing. 5. L5-S1 severe bilateral neural foraminal narrowing. 6. Clumping of the nerve roots inferior to L3-L4, which can be seen in the setting of arachnoiditis. 7. No acute fracture or suspicious osseous lesion in the thoracic or lumbar spine. No evidence of discitis or osteomyelitis. Electronically Signed   By: Wiliam Ke M.D.   On: 12/11/2022 19:43   MR Lumbar Spine W Wo Contrast  Result Date: 12/11/2022 CLINICAL DATA:  Back pain EXAM: MRI THORACIC AND LUMBAR SPINE WITHOUT AND WITH CONTRAST TECHNIQUE: Multiplanar and multiecho pulse sequences of the thoracic and lumbar spine were obtained without and with intravenous contrast. CONTRAST:  6.18mL GADAVIST GADOBUTROL 1 MMOL/ML IV SOLN COMPARISON:  05/26/2020 MRI lumbar spine, no prior MRI thoracic spine available; correlation is made with CT thoracic and lumbar spine 12/11/2022 FINDINGS: MRI THORACIC SPINE FINDINGS Alignment: No significant listhesis. Dextrocurvature of the thoracic spine. Vertebrae: No acute fracture, suspicious osseous lesion, or evidence of discitis. T1 and T2 hyperintense lesion at the anterior aspect of T6, compatible with a benign hemangioma. Cord:  Normal signal and morphology.  No abnormal enhancement. Paraspinal and other soft tissues: Negative. Disc levels: T1-T2: Mild disc bulge. Mild facet arthropathy. No spinal canal stenosis. Mild left-greater-than-right neural foraminal narrowing. T9-T10: No significant disc bulge. Facet arthropathy. No spinal canal stenosis. Mild bilateral neural foraminal narrowing. T10-T11: Left paracentral/subarticular disc protrusion. Moderate facet arthropathy. Moderate spinal canal stenosis. Mild bilateral neural foraminal  narrowing. MRI LUMBAR SPINE FINDINGS Segmentation: 5 lumbar type vertebral bodies. The last fully formed disc space is labeled L5-S1. Alignment: Trace retrolisthesis of L2 on L3 and trace anterolisthesis of L3 on L4, unchanged. S-shaped curvature of the thoracolumbar spine. Straightening of the normal lumbar lordosis. Vertebrae: Chronic appearing compression deformity of L2, with up to 30% vertebral body height loss anteriorly, and a superimposed Schmorl's node forming at the superior endplate, which is associated with edema and enhancement. Edema is also noted in the L1-L2 disc, but no abnormal enhancement is noted in the disc. No acute fracture,  suspicious osseous lesion, or evidence of discitis. Endplate degenerative changes at L4-L5, eccentric to the right, and L5-S1, eccentric to the left. No abnormal enhancement. Conus medullaris: Extends to the L2 level and appears normal. Clumping of the nerve roots inferior to L3-L4 (series 14, image 21). Cauda equina otherwise unremarkable. No abnormal nerve root enhancement. Paraspinal and other soft tissues: Negative. Disc levels: T12-L1: No significant disc bulge. No spinal canal stenosis or neural foraminal narrowing. L1-L2: Mild disc bulge. No spinal canal stenosis or neural foraminal narrowing. L2-L3: Trace retrolisthesis and moderate disc bulge. Mild facet arthropathy. Ligamentum flavum hypertrophy. Effacement of the lateral recesses. Moderate spinal canal stenosis, which has progressed from the prior exam. Mild right and mild-to-moderate left neural foraminal narrowing, which have progressed from the prior exam. L3-L4: Trace anterolisthesis with left eccentric disc bulge, with left foraminal and extreme lateral protrusion. Moderate facet arthropathy. Ligamentum flavum hypertrophy. Effacement of the lateral recesses. Moderate spinal canal stenosis, which appears slightly progressed from prior exam. Severe left and mild right neural foraminal narrowing, unchanged.  L4-L5: Disc height loss and mild disc bulge with right subarticular protrusion, which narrows the right lateral recess. Mild facet arthropathy. No spinal canal stenosis. Severe right and moderate left neural foraminal narrowing, unchanged. L5-S1: Disc height loss and moderate disc bulge, with left subarticular protrusion, which narrows the left lateral recess. Mild facet arthropathy. No spinal canal stenosis. Severe bilateral neural foraminal narrowing, which has progressed from the prior exam. IMPRESSION: 1. T10-T11 moderate spinal canal stenosis and mild bilateral neural foraminal narrowing. 2. L2-L3 moderate spinal canal stenosis with mild right and mild-to-moderate left neural foraminal narrowing. 3. L3-L4 moderate spinal canal stenosis with severe left and mild right neural foraminal narrowing. 4. L4-L5 severe right and moderate left neural foraminal narrowing. 5. L5-S1 severe bilateral neural foraminal narrowing. 6. Clumping of the nerve roots inferior to L3-L4, which can be seen in the setting of arachnoiditis. 7. No acute fracture or suspicious osseous lesion in the thoracic or lumbar spine. No evidence of discitis or osteomyelitis. Electronically Signed   By: Wiliam Ke M.D.   On: 12/11/2022 19:43   CT THORACIC SPINE W CONTRAST  Result Date: 12/11/2022 CLINICAL DATA:  Mid back pain, neuro deficit. EXAM: CT THORACIC SPINE WITH CONTRAST TECHNIQUE: Multidetector CT images of thoracic was performed according to the standard protocol following intravenous contrast administration. RADIATION DOSE REDUCTION: This exam was performed according to the departmental dose-optimization program which includes automated exposure control, adjustment of the mA and/or kV according to patient size and/or use of iterative reconstruction technique. CONTRAST:  75mL OMNIPAQUE IOHEXOL 350 MG/ML SOLN COMPARISON:  Two-view chest x-ray 05/22/2022 FINDINGS: Alignment: No significant listhesis is present. Mild rightward curvature  is present in the midthoracic spine, centered at T8. Vertebrae: Vertebral body heights are normal. No acute fractures scratched at no acute or healing fractures are present. Paraspinal and other soft tissues: Paraspinous soft tissues are within normal limits. The visualized lung fields are clear. Atherosclerotic changes are present in the descending thoracic aorta splenic calcifications are noted. Disc levels: Endplate and facet spurring contribute to foraminal stenosis bilaterally at T1-2, right greater than left. Mild right foraminal narrowing is present at T2-3 and T3-4 secondary to facet spurring. Facet spurring results in mild left foraminal narrowing at T9-10, T10-11 and T11-12. No significant central disc protrusion or stenosis is present. IMPRESSION: 1. No acute or healing fractures. 2. Mild rightward curvature of the midthoracic spine, centered at T8. 3. Mild right foraminal narrowing at T2-3  and T3-4 secondary to facet spurring. 4. Mild left foraminal narrowing at T9-10, T10-11 and T11-12 secondary to facet spurring. 5.  Aortic Atherosclerosis (ICD10-I70.0). Electronically Signed   By: Marin Roberts M.D.   On: 12/11/2022 15:44   CT LUMBAR SPINE W CONTRAST  Result Date: 12/11/2022 CLINICAL DATA:  Low back pain, symptoms persist with greater than 6 weeks treatment. Lumbar radiculopathy. EXAM: CT LUMBAR SPINE WITH CONTRAST TECHNIQUE: Multidetector CT imaging of the lumbar spine was performed with intravenous contrast administration. RADIATION DOSE REDUCTION: This exam was performed according to the departmental dose-optimization program which includes automated exposure control, adjustment of the mA and/or kV according to patient size and/or use of iterative reconstruction technique. CONTRAST:  75mL OMNIPAQUE IOHEXOL 350 MG/ML SOLN COMPARISON:  MRI of lumbar spine 05/26/2020 FINDINGS: Segmentation: 5 non rib-bearing lumbar type vertebral bodies are present. The lowest fully formed vertebral body is  L5. Alignment: Slight anterolisthesis at L3-4 is stable. Levoconvex curvature is centered at L4. Vertebrae: Asymmetric endplate sclerotic changes are present on the right L4-5 and on the left at L5-S1. A remote superior endplate fracture is present at L2. Vertebral body heights are otherwise maintained. No focal osseous lesions are present. Paraspinal and other soft tissues: Atherosclerotic changes are present in the aorta and branch vessels without aneurysm. A small amount of free fluid is present within the imaged lower pelvis. Wall thickening is present in the urinary bladder. Disc levels: L1-2: Mild facet hypertrophy is present. Leftward disc protrusion is noted. Moderate left foraminal stenosis is present. L2-3: A leftward disc protrusion is present. Moderate left foraminal narrowing is present. Mild right foraminal narrowing is present. L3-4: A broad-based disc protrusion is present. Advanced facet hypertrophy and ligamentum flavum thickening is present. Severe central and moderate bilateral foraminal stenosis is present. L4-5: A broad-based disc protrusion is present. Right laminotomy noted. Residual or recurrent left subarticular narrowing is present. Severe foraminal stenosis is worse on the right. L5-S1: A broad-based scratched at chronic loss of disc height is present. Vacuum disc is present. Facet spurring and endplate spurring result in severe foraminal stenosis bilaterally. Moderate central canal stenosis is present. IMPRESSION: 1. Levoconvex curvature of the lumbar spine is centered at L4. 2. Remote superior endplate fracture at L2. 3. Severe central and moderate bilateral foraminal stenosis at L3-4 secondary to a broad-based disc protrusion, advanced facet hypertrophy, and ligamentum flavum thickening. 4. Right laminotomy at L4-5 with residual or recurrent left subarticular narrowing. 5. Severe foraminal stenosis bilaterally at L4-5 is worse on the right. 6. Severe foraminal stenosis bilaterally at  L5-S1. 7. Moderate central canal stenosis at L5-S1. 8. Moderate left foraminal stenosis at L1-2 and L2-3. 9.  Aortic Atherosclerosis (ICD10-I70.0). Electronically Signed   By: Marin Roberts M.D.   On: 12/11/2022 15:30   CT Head Wo Contrast  Result Date: 12/11/2022 CLINICAL DATA:  No deficit, acute, stroke suspected. EXAM: CT HEAD WITHOUT CONTRAST TECHNIQUE: Contiguous axial images were obtained from the base of the skull through the vertex without intravenous contrast. RADIATION DOSE REDUCTION: This exam was performed according to the departmental dose-optimization program which includes automated exposure control, adjustment of the mA and/or kV according to patient size and/or use of iterative reconstruction technique. COMPARISON:  CT head without contrast 02/03/2022 FINDINGS: Brain: No acute infarct, hemorrhage, or mass lesion is present. Mild generalized white matter hypoattenuation is similar the prior exam. The ventricles are of normal size. No significant extraaxial fluid collection is present. The brainstem and cerebellum are within normal limits. Midline  structures are within normal limits. Vascular: No hyperdense vessel or unexpected calcification. Skull: Calvarium is intact. No focal lytic or blastic lesions are present. No significant extracranial soft tissue lesion is present. Sinuses/Orbits: The paranasal sinuses and mastoid air cells are clear. The globes and orbits are within normal limits. IMPRESSION: 1. No acute intracranial abnormality or significant interval change. 2. Stable mild white matter disease. This likely reflects the sequela of chronic microvascular ischemia. Electronically Signed   By: Marin Roberts M.D.   On: 12/11/2022 15:25    Procedures Procedures    Medications Ordered in ED Medications  morphine (PF) 4 MG/ML injection 4 mg (4 mg Intravenous Given 12/11/22 1324)  ondansetron (ZOFRAN) injection 4 mg (4 mg Intravenous Given 12/11/22 1323)  iohexol  (OMNIPAQUE) 350 MG/ML injection 75 mL (75 mLs Intravenous Contrast Given 12/11/22 1512)  ondansetron (ZOFRAN) injection 4 mg (4 mg Intravenous Given 12/11/22 1705)  HYDROmorphone (DILAUDID) injection 1 mg (1 mg Intravenous Given 12/11/22 1705)  LORazepam (ATIVAN) tablet 1 mg (1 mg Oral Given 12/11/22 1705)  gadobutrol (GADAVIST) 1 MMOL/ML injection 6.5 mL (6.5 mLs Intravenous Contrast Given 12/11/22 1845)  HYDROmorphone (DILAUDID) injection 1 mg (1 mg Intravenous Given 12/11/22 2016)    ED Course/ Medical Decision Making/ A&P                             Medical Decision Making Amount and/or Complexity of Data Reviewed Labs: ordered. Decision-making details documented in ED Course. Radiology: ordered.  Risk Prescription drug management.   Medical Decision Making:   Mahagany Liske is a 72 y.o. female who presented to the ED today with back pain detailed above.    Patient's presentation is complicated by their history of HTN, HLD, thyroid cancer, chronic pain.  Complete initial physical exam performed, notably the patient  was in mild distress secondary to pain.  She had objective weakness to the left lower extremity and paresthesias compared to the right lower extremity.  She favor the right lower extremity with ambulation.  Reflexes intact.  Diffuse midline and left paraspinal lumbar tenderness without obvious deformity.    Reviewed and confirmed nursing documentation for past medical history, family history, social history.    Initial Assessment:   With the patient's presentation of back pain, the emergent differential diagnosis for back pain includes but is not limited to fracture, muscle strain, cauda equina, spinal stenosis, DDD, ankylosing spondylitis, acute ligamentous injury, disk herniation, spondylolisthesis, epidural compression syndrome, metastatic cancer, transverse myelitis, vertebral osteomyelitis, diskitis, kidney stone, pyelonephritis, AAA, Perforated ulcer, retrocecal  appendicitis, pancreatitis, bowel obstruction, retroperitoneal hemorrhage or mass, meningitis.   Initial Plan:  Screening labs including CBC and Metabolic panel to evaluate for infectious or metabolic etiology of disease.  Urinalysis with reflex culture ordered to evaluate for UTI or relevant urologic/nephrologic pathology.  Symptomatic management CT brain to assess for intracranial pathology CT T/L spine to assess for spinal pathology in setting of pt declining MRI scans Pt declined any MRI scans though extensive discussion with pt regarding this is favored imaging but she continues to decline Objective evaluation as reviewed   Initial Study Results:   Laboratory  All laboratory results reviewed without evidence of clinically relevant pathology.    Radiology:  All images reviewed independently. Agree with radiology report at this time.   MR THORACIC SPINE W WO CONTRAST  Result Date: 12/11/2022 CLINICAL DATA:  Back pain EXAM: MRI THORACIC AND LUMBAR SPINE WITHOUT AND WITH  CONTRAST TECHNIQUE: Multiplanar and multiecho pulse sequences of the thoracic and lumbar spine were obtained without and with intravenous contrast. CONTRAST:  6.59mL GADAVIST GADOBUTROL 1 MMOL/ML IV SOLN COMPARISON:  05/26/2020 MRI lumbar spine, no prior MRI thoracic spine available; correlation is made with CT thoracic and lumbar spine 12/11/2022 FINDINGS: MRI THORACIC SPINE FINDINGS Alignment: No significant listhesis. Dextrocurvature of the thoracic spine. Vertebrae: No acute fracture, suspicious osseous lesion, or evidence of discitis. T1 and T2 hyperintense lesion at the anterior aspect of T6, compatible with a benign hemangioma. Cord:  Normal signal and morphology.  No abnormal enhancement. Paraspinal and other soft tissues: Negative. Disc levels: T1-T2: Mild disc bulge. Mild facet arthropathy. No spinal canal stenosis. Mild left-greater-than-right neural foraminal narrowing. T9-T10: No significant disc bulge. Facet  arthropathy. No spinal canal stenosis. Mild bilateral neural foraminal narrowing. T10-T11: Left paracentral/subarticular disc protrusion. Moderate facet arthropathy. Moderate spinal canal stenosis. Mild bilateral neural foraminal narrowing. MRI LUMBAR SPINE FINDINGS Segmentation: 5 lumbar type vertebral bodies. The last fully formed disc space is labeled L5-S1. Alignment: Trace retrolisthesis of L2 on L3 and trace anterolisthesis of L3 on L4, unchanged. S-shaped curvature of the thoracolumbar spine. Straightening of the normal lumbar lordosis. Vertebrae: Chronic appearing compression deformity of L2, with up to 30% vertebral body height loss anteriorly, and a superimposed Schmorl's node forming at the superior endplate, which is associated with edema and enhancement. Edema is also noted in the L1-L2 disc, but no abnormal enhancement is noted in the disc. No acute fracture, suspicious osseous lesion, or evidence of discitis. Endplate degenerative changes at L4-L5, eccentric to the right, and L5-S1, eccentric to the left. No abnormal enhancement. Conus medullaris: Extends to the L2 level and appears normal. Clumping of the nerve roots inferior to L3-L4 (series 14, image 21). Cauda equina otherwise unremarkable. No abnormal nerve root enhancement. Paraspinal and other soft tissues: Negative. Disc levels: T12-L1: No significant disc bulge. No spinal canal stenosis or neural foraminal narrowing. L1-L2: Mild disc bulge. No spinal canal stenosis or neural foraminal narrowing. L2-L3: Trace retrolisthesis and moderate disc bulge. Mild facet arthropathy. Ligamentum flavum hypertrophy. Effacement of the lateral recesses. Moderate spinal canal stenosis, which has progressed from the prior exam. Mild right and mild-to-moderate left neural foraminal narrowing, which have progressed from the prior exam. L3-L4: Trace anterolisthesis with left eccentric disc bulge, with left foraminal and extreme lateral protrusion. Moderate facet  arthropathy. Ligamentum flavum hypertrophy. Effacement of the lateral recesses. Moderate spinal canal stenosis, which appears slightly progressed from prior exam. Severe left and mild right neural foraminal narrowing, unchanged. L4-L5: Disc height loss and mild disc bulge with right subarticular protrusion, which narrows the right lateral recess. Mild facet arthropathy. No spinal canal stenosis. Severe right and moderate left neural foraminal narrowing, unchanged. L5-S1: Disc height loss and moderate disc bulge, with left subarticular protrusion, which narrows the left lateral recess. Mild facet arthropathy. No spinal canal stenosis. Severe bilateral neural foraminal narrowing, which has progressed from the prior exam. IMPRESSION: 1. T10-T11 moderate spinal canal stenosis and mild bilateral neural foraminal narrowing. 2. L2-L3 moderate spinal canal stenosis with mild right and mild-to-moderate left neural foraminal narrowing. 3. L3-L4 moderate spinal canal stenosis with severe left and mild right neural foraminal narrowing. 4. L4-L5 severe right and moderate left neural foraminal narrowing. 5. L5-S1 severe bilateral neural foraminal narrowing. 6. Clumping of the nerve roots inferior to L3-L4, which can be seen in the setting of arachnoiditis. 7. No acute fracture or suspicious osseous lesion in the thoracic or  lumbar spine. No evidence of discitis or osteomyelitis. Electronically Signed   By: Wiliam Ke M.D.   On: 12/11/2022 19:43   MR Lumbar Spine W Wo Contrast  Result Date: 12/11/2022 CLINICAL DATA:  Back pain EXAM: MRI THORACIC AND LUMBAR SPINE WITHOUT AND WITH CONTRAST TECHNIQUE: Multiplanar and multiecho pulse sequences of the thoracic and lumbar spine were obtained without and with intravenous contrast. CONTRAST:  6.65mL GADAVIST GADOBUTROL 1 MMOL/ML IV SOLN COMPARISON:  05/26/2020 MRI lumbar spine, no prior MRI thoracic spine available; correlation is made with CT thoracic and lumbar spine 12/11/2022  FINDINGS: MRI THORACIC SPINE FINDINGS Alignment: No significant listhesis. Dextrocurvature of the thoracic spine. Vertebrae: No acute fracture, suspicious osseous lesion, or evidence of discitis. T1 and T2 hyperintense lesion at the anterior aspect of T6, compatible with a benign hemangioma. Cord:  Normal signal and morphology.  No abnormal enhancement. Paraspinal and other soft tissues: Negative. Disc levels: T1-T2: Mild disc bulge. Mild facet arthropathy. No spinal canal stenosis. Mild left-greater-than-right neural foraminal narrowing. T9-T10: No significant disc bulge. Facet arthropathy. No spinal canal stenosis. Mild bilateral neural foraminal narrowing. T10-T11: Left paracentral/subarticular disc protrusion. Moderate facet arthropathy. Moderate spinal canal stenosis. Mild bilateral neural foraminal narrowing. MRI LUMBAR SPINE FINDINGS Segmentation: 5 lumbar type vertebral bodies. The last fully formed disc space is labeled L5-S1. Alignment: Trace retrolisthesis of L2 on L3 and trace anterolisthesis of L3 on L4, unchanged. S-shaped curvature of the thoracolumbar spine. Straightening of the normal lumbar lordosis. Vertebrae: Chronic appearing compression deformity of L2, with up to 30% vertebral body height loss anteriorly, and a superimposed Schmorl's node forming at the superior endplate, which is associated with edema and enhancement. Edema is also noted in the L1-L2 disc, but no abnormal enhancement is noted in the disc. No acute fracture, suspicious osseous lesion, or evidence of discitis. Endplate degenerative changes at L4-L5, eccentric to the right, and L5-S1, eccentric to the left. No abnormal enhancement. Conus medullaris: Extends to the L2 level and appears normal. Clumping of the nerve roots inferior to L3-L4 (series 14, image 21). Cauda equina otherwise unremarkable. No abnormal nerve root enhancement. Paraspinal and other soft tissues: Negative. Disc levels: T12-L1: No significant disc bulge. No  spinal canal stenosis or neural foraminal narrowing. L1-L2: Mild disc bulge. No spinal canal stenosis or neural foraminal narrowing. L2-L3: Trace retrolisthesis and moderate disc bulge. Mild facet arthropathy. Ligamentum flavum hypertrophy. Effacement of the lateral recesses. Moderate spinal canal stenosis, which has progressed from the prior exam. Mild right and mild-to-moderate left neural foraminal narrowing, which have progressed from the prior exam. L3-L4: Trace anterolisthesis with left eccentric disc bulge, with left foraminal and extreme lateral protrusion. Moderate facet arthropathy. Ligamentum flavum hypertrophy. Effacement of the lateral recesses. Moderate spinal canal stenosis, which appears slightly progressed from prior exam. Severe left and mild right neural foraminal narrowing, unchanged. L4-L5: Disc height loss and mild disc bulge with right subarticular protrusion, which narrows the right lateral recess. Mild facet arthropathy. No spinal canal stenosis. Severe right and moderate left neural foraminal narrowing, unchanged. L5-S1: Disc height loss and moderate disc bulge, with left subarticular protrusion, which narrows the left lateral recess. Mild facet arthropathy. No spinal canal stenosis. Severe bilateral neural foraminal narrowing, which has progressed from the prior exam. IMPRESSION: 1. T10-T11 moderate spinal canal stenosis and mild bilateral neural foraminal narrowing. 2. L2-L3 moderate spinal canal stenosis with mild right and mild-to-moderate left neural foraminal narrowing. 3. L3-L4 moderate spinal canal stenosis with severe left and mild right neural foraminal  narrowing. 4. L4-L5 severe right and moderate left neural foraminal narrowing. 5. L5-S1 severe bilateral neural foraminal narrowing. 6. Clumping of the nerve roots inferior to L3-L4, which can be seen in the setting of arachnoiditis. 7. No acute fracture or suspicious osseous lesion in the thoracic or lumbar spine. No evidence of  discitis or osteomyelitis. Electronically Signed   By: Wiliam Ke M.D.   On: 12/11/2022 19:43   CT THORACIC SPINE W CONTRAST  Result Date: 12/11/2022 CLINICAL DATA:  Mid back pain, neuro deficit. EXAM: CT THORACIC SPINE WITH CONTRAST TECHNIQUE: Multidetector CT images of thoracic was performed according to the standard protocol following intravenous contrast administration. RADIATION DOSE REDUCTION: This exam was performed according to the departmental dose-optimization program which includes automated exposure control, adjustment of the mA and/or kV according to patient size and/or use of iterative reconstruction technique. CONTRAST:  75mL OMNIPAQUE IOHEXOL 350 MG/ML SOLN COMPARISON:  Two-view chest x-ray 05/22/2022 FINDINGS: Alignment: No significant listhesis is present. Mild rightward curvature is present in the midthoracic spine, centered at T8. Vertebrae: Vertebral body heights are normal. No acute fractures scratched at no acute or healing fractures are present. Paraspinal and other soft tissues: Paraspinous soft tissues are within normal limits. The visualized lung fields are clear. Atherosclerotic changes are present in the descending thoracic aorta splenic calcifications are noted. Disc levels: Endplate and facet spurring contribute to foraminal stenosis bilaterally at T1-2, right greater than left. Mild right foraminal narrowing is present at T2-3 and T3-4 secondary to facet spurring. Facet spurring results in mild left foraminal narrowing at T9-10, T10-11 and T11-12. No significant central disc protrusion or stenosis is present. IMPRESSION: 1. No acute or healing fractures. 2. Mild rightward curvature of the midthoracic spine, centered at T8. 3. Mild right foraminal narrowing at T2-3 and T3-4 secondary to facet spurring. 4. Mild left foraminal narrowing at T9-10, T10-11 and T11-12 secondary to facet spurring. 5.  Aortic Atherosclerosis (ICD10-I70.0). Electronically Signed   By: Marin Roberts M.D.   On: 12/11/2022 15:44   CT LUMBAR SPINE W CONTRAST  Result Date: 12/11/2022 CLINICAL DATA:  Low back pain, symptoms persist with greater than 6 weeks treatment. Lumbar radiculopathy. EXAM: CT LUMBAR SPINE WITH CONTRAST TECHNIQUE: Multidetector CT imaging of the lumbar spine was performed with intravenous contrast administration. RADIATION DOSE REDUCTION: This exam was performed according to the departmental dose-optimization program which includes automated exposure control, adjustment of the mA and/or kV according to patient size and/or use of iterative reconstruction technique. CONTRAST:  75mL OMNIPAQUE IOHEXOL 350 MG/ML SOLN COMPARISON:  MRI of lumbar spine 05/26/2020 FINDINGS: Segmentation: 5 non rib-bearing lumbar type vertebral bodies are present. The lowest fully formed vertebral body is L5. Alignment: Slight anterolisthesis at L3-4 is stable. Levoconvex curvature is centered at L4. Vertebrae: Asymmetric endplate sclerotic changes are present on the right L4-5 and on the left at L5-S1. A remote superior endplate fracture is present at L2. Vertebral body heights are otherwise maintained. No focal osseous lesions are present. Paraspinal and other soft tissues: Atherosclerotic changes are present in the aorta and branch vessels without aneurysm. A small amount of free fluid is present within the imaged lower pelvis. Wall thickening is present in the urinary bladder. Disc levels: L1-2: Mild facet hypertrophy is present. Leftward disc protrusion is noted. Moderate left foraminal stenosis is present. L2-3: A leftward disc protrusion is present. Moderate left foraminal narrowing is present. Mild right foraminal narrowing is present. L3-4: A broad-based disc protrusion is present. Advanced facet hypertrophy and  ligamentum flavum thickening is present. Severe central and moderate bilateral foraminal stenosis is present. L4-5: A broad-based disc protrusion is present. Right laminotomy noted. Residual  or recurrent left subarticular narrowing is present. Severe foraminal stenosis is worse on the right. L5-S1: A broad-based scratched at chronic loss of disc height is present. Vacuum disc is present. Facet spurring and endplate spurring result in severe foraminal stenosis bilaterally. Moderate central canal stenosis is present. IMPRESSION: 1. Levoconvex curvature of the lumbar spine is centered at L4. 2. Remote superior endplate fracture at L2. 3. Severe central and moderate bilateral foraminal stenosis at L3-4 secondary to a broad-based disc protrusion, advanced facet hypertrophy, and ligamentum flavum thickening. 4. Right laminotomy at L4-5 with residual or recurrent left subarticular narrowing. 5. Severe foraminal stenosis bilaterally at L4-5 is worse on the right. 6. Severe foraminal stenosis bilaterally at L5-S1. 7. Moderate central canal stenosis at L5-S1. 8. Moderate left foraminal stenosis at L1-2 and L2-3. 9.  Aortic Atherosclerosis (ICD10-I70.0). Electronically Signed   By: Marin Roberts M.D.   On: 12/11/2022 15:30   CT Head Wo Contrast  Result Date: 12/11/2022 CLINICAL DATA:  No deficit, acute, stroke suspected. EXAM: CT HEAD WITHOUT CONTRAST TECHNIQUE: Contiguous axial images were obtained from the base of the skull through the vertex without intravenous contrast. RADIATION DOSE REDUCTION: This exam was performed according to the departmental dose-optimization program which includes automated exposure control, adjustment of the mA and/or kV according to patient size and/or use of iterative reconstruction technique. COMPARISON:  CT head without contrast 02/03/2022 FINDINGS: Brain: No acute infarct, hemorrhage, or mass lesion is present. Mild generalized white matter hypoattenuation is similar the prior exam. The ventricles are of normal size. No significant extraaxial fluid collection is present. The brainstem and cerebellum are within normal limits. Midline structures are within normal limits.  Vascular: No hyperdense vessel or unexpected calcification. Skull: Calvarium is intact. No focal lytic or blastic lesions are present. No significant extracranial soft tissue lesion is present. Sinuses/Orbits: The paranasal sinuses and mastoid air cells are clear. The globes and orbits are within normal limits. IMPRESSION: 1. No acute intracranial abnormality or significant interval change. 2. Stable mild white matter disease. This likely reflects the sequela of chronic microvascular ischemia. Electronically Signed   By: Marin Roberts M.D.   On: 12/11/2022 15:25      Consults: Case discussed with Dr. Amada Jupiter with neurology who recommended referring to neurology for EMG and pain management.   Final Assessment and Plan:   Patient presents to ED c/o back pain.  Patient does have somewhat of a longstanding chronic history of back pain.  She does note that recently symptoms have seemed to change and are worse in the left lower back and she has associated paresthesias and weakness to the left lower extremity.  Also noted recent weight loss.  At first, patient not agreeable with MRI scan secondary to anxiety and claustrophobia.  Offered antianxiety medication but patient originally declined.  Proceeded with CT of the spine as above for further assessment.  This revealed chronic degenerative changes/spinal stenosis but no acute findings to explain patient's symptoms.  Labs also unrevealing of cause.  Overall, labs are reassuring.  Patient with somewhat increased pain control following first dose of medication.  She does appear more comfortable but is still having significant difficulty with ambulation.  Per PDMP review, she was recently prescribed a large amount of narcotic pain medication which should be available for her.  However, with new symptoms I am concerned  for any emergent process related to her back pain and focal deficits so encouraged MRI scan which patient was eventually agreeable to proceed  with.  Administer Ativan for antianxiety prior to MRI which she did tolerate.  MRI showed similar degenerative changes as a CT scan but did show another finding of arachnoiditis.  This was discussed with neurology who stated that there was no indication for admission to the hospital for this condition but patient will likely require close outpatient follow-up with neurology for an EMG as well as pain management.  Patient expressed some dissatisfaction with her current primary care/previous pain management.  Unfortunately, we do not have a pain management provider who we are able to refer to from the ED setting.  I will refer her to neurology for an EMG and further management of this condition.  I also provided her with additional primary care follow-up and discussed need for follow-up with pain management who she can arrange through a new primary care if she so desires.  Patient expressed extreme gratitude for care received today and happy to know that there is a finding to explain her symptoms.  She is agreeable to closely follow-up outpatient.  On reexam, she is ambulatory and with good pain control.  She did express that secondary to pain she has experienced some nausea at home.  Provided with antiemetics to use as needed.  Strict ED return precautions given, all questions answered, and stable for discharge.   Clinical Impression:  1. Arachnoiditis   2. Chronic midline low back pain, unspecified whether sciatica present      Discharge           Final Clinical Impression(s) / ED Diagnoses Final diagnoses:  Arachnoiditis  Chronic midline low back pain, unspecified whether sciatica present    Rx / DC Orders ED Discharge Orders          Ordered    Ambulatory referral to Neurology       Comments: An appointment is requested in approximately: 1 week   12/11/22 2039    ondansetron (ZOFRAN) 4 MG tablet  Every 8 hours PRN        12/11/22 2040              Richardson Dopp 12/11/22 2113    Benjiman Core, MD 12/16/22 [id number]

## 2022-12-11 NOTE — ED Triage Notes (Signed)
Pt to ER via EMS from  home with c/o chronic low back pain.  Pt reports difficulty in getting medications refilled.  Increasing pain for last 2 months.  No new injury or symptoms to report.

## 2022-12-22 ENCOUNTER — Encounter: Payer: Self-pay | Admitting: Neurology

## 2022-12-22 ENCOUNTER — Ambulatory Visit (INDEPENDENT_AMBULATORY_CARE_PROVIDER_SITE_OTHER): Payer: 59 | Admitting: Neurology

## 2022-12-22 VITALS — BP 142/79 | HR 61 | Ht 65.0 in | Wt 145.0 lb

## 2022-12-22 DIAGNOSIS — M5416 Radiculopathy, lumbar region: Secondary | ICD-10-CM | POA: Diagnosis not present
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
## 2022-12-23 ENCOUNTER — Telehealth: Payer: Self-pay | Admitting: Neurology

## 2022-12-23 NOTE — Telephone Encounter (Signed)
Referral faxed to [facility]: Phone: [phone]  Fax:[phone] 

## 2023-04-08 ENCOUNTER — Telehealth: Payer: Self-pay | Admitting: Neurology

## 2023-04-08 NOTE — Telephone Encounter (Signed)
Pt called and spoke with [name] about referral to [facility], [name] transferred call. Denise Simmons said why sent referral to [facility] for surgery. When I need to be treated for radiculopathy by the neurologist.  I [name]) explained the neurologist referred you to [facility] for pain management. Verbalized understand and ask for the phone number to [facility].

## 2024-06-06 ENCOUNTER — Ambulatory Visit: Payer: Self-pay

## 2024-06-06 NOTE — Telephone Encounter (Signed)
 FYI Only or Action Required?: Action required by provider: request for appointment.  Patient is followed in Pulmonology for -, last seen on n/a.  Called Nurse Triage reporting Shortness of Breath.  Symptoms began 05/17/2024.  Interventions attempted: Nothing.  Symptoms are: gradually worsening.  Triage Disposition: See PCP When Office is Open (Within 3 Days)  Patient/caregiver understands and will follow disposition?: Yes   Copied from CRM #[id number]. Topic: Clinical - Red Word Triage >> Jun 06, 2024  1:06 PM Rilla B wrote: [facility] that prompted transfer to Nurse Triage: Difficulty breathing, Persistent, Coughing, Phlegm   ----------------------------------------------------------------------- From previous Reason for Contact - Scheduling: Patient/patient representative is calling to schedule an appointment. Refer to attachments for appointment information. Reason for Disposition  [1] MODERATE longstanding difficulty breathing (e.g., speaks in phrases, SOB even at rest, pulse 100-120) AND [2] SAME as normal    Routing to office to schedule.  Answer Assessment - Initial Assessment Questions 1. RESPIRATORY STATUS: Describe your breathing? (e.g., wheezing, shortness of breath, unable to speak, severe coughing)      Severe coughing & SOB 2. ONSET: When did this breathing problem begin?      05/17/2024 3. PATTERN Does the difficult breathing come and go, or has it been constant since it started?      Comes and goes 4. SEVERITY: How bad is your breathing? (e.g., mild, moderate, severe)      moderate 5. RECURRENT SYMPTOM: Have you had difficulty breathing before? If Yes, ask: When was the last time? and What happened that time?      no 6. CARDIAC HISTORY: Do you have any history of heart disease? (e.g., heart attack, angina, bypass surgery, angioplasty)      no 7. LUNG HISTORY: Do you have any history of lung disease?  (e.g., pulmonary embolus, asthma,  emphysema)    emphysema 8. CAUSE: What do you think is causing the breathing problem?      emphysema 9. OTHER SYMPTOMS: Do you have any other symptoms? (e.g., chest pain, cough, dizziness, fever, runny nose)     Fever at times, 10. O2 SATURATION MONITOR:  Do you use an oxygen saturation monitor (pulse oximeter) at home? If Yes, ask: What is your reading (oxygen level) today? What is your usual oxygen saturation reading? (e.g., 95%)       na 11. PREGNANCY: Is there any chance you are pregnant? When was your last menstrual period?       na 12. TRAVEL: Have you traveled out of the country in the last month? (e.g., travel history, exposures)       na Pt stated she started having some SOB on 05/17/2024 went to have   a scan and was told she has emphysema. Pt is unable to get up and move around for extended periods of time without having any SOB. Nurse offered appt for today with different provider: pt stated at therapy appt today: nurse offered appointment with different provider for tomorrow then pt stated 4 pm appt to late b/c she would be driving in the dark.  Pt called today to schedule an appt with Dr. Darlean: no appt available with this provider.  Pt would like office to call with appt.  Protocols used: Breathing Difficulty-A-AH

## 2024-06-06 NOTE — Telephone Encounter (Signed)
 ATC x1.  Left message for patient to return call to schedule new patient appointment.  She was last seen by Dr. Darlean on 04/26/2020.  Will await return call.  When she calls back, please schedule her as a new patient.  Thank you.

## 2024-06-08 NOTE — Telephone Encounter (Signed)
 Scheduled as new patient on 12/18.  Nothing further needed.

## 2024-06-14 NOTE — Progress Notes (Signed)
 New Patient Pulmonology Office Visit   Subjective:  Patient ID: Denise Simmons, female    DOB: Oct 15, 1950  MRN: [id number]  Referred by: Sophronia Ozell BROCKS, MD  CC: No chief complaint on file.   HPI Denise Simmons is a 73 y.o. female with ***emphysema, SOB, ciugh ?  {PULM QUESTIONNAIRES (Optional):33196}  ROS  Allergies: Celebrex [celecoxib], Doxycycline , Fentanyl , Lisinopril, Naproxen, Nsaids, Prednisone, Pregabalin, Sumatriptan, Tolmetin, Estradiol, Fish allergy, Methotrexate, Methotrexate and trimetrexate, Latex, Penicillins, Sulfa antibiotics, and Sulfamethoxazole Current Medications[1] Past Medical History:  Diagnosis Date   Allergic rhinitis    Anxiety and depression    Arthritis    rheumatoid   Cancer (HCC)    Thyroid   Carpal tunnel syndrome    COPD (chronic obstructive pulmonary disease) (HCC)    Fibromyalgia    Hyperlipemia    Hypertension    Insomnia    Migraine    Neck pain    Osteogenesis imperfecta    Osteoporosis    Raynaud's disease    Wrist pain, left    Past Surgical History:  Procedure Laterality Date   ABDOMINAL HYSTERECTOMY     BREAST BIOPSY     HEMORROIDECTOMY     LUMBAR LAMINECTOMY/DECOMPRESSION MICRODISCECTOMY  10/09/2011   Procedure: LUMBAR LAMINECTOMY/DECOMPRESSION MICRODISCECTOMY 1 LEVEL;  Surgeon: Fairy Levels, MD;  Location: [facility];  Service: Neurosurgery;  Laterality: Right;  RIGHT Lumbar four-five foraminotomy with possible microdiskectomy   TUBAL LIGATION     Family History  Problem Relation Age of Onset   Rheum arthritis Mother    Breast cancer Mother    Alzheimer's disease Mother    Heart disease Mother    Hypertension Mother    Cirrhosis Father    Breast cancer Sister    Pancreatic cancer Sister    Prostate cancer Brother    Ovarian cancer Maternal Grandmother    Diabetes Maternal Grandmother    Rectal cancer Paternal Grandmother    Social History   Socioeconomic History   Marital status:  Divorced    Spouse name: Not on file   Number of children: 3   Years of education: GED   Highest education level: Not on file  Occupational History   Occupation: Disabled  Tobacco Use   Smoking status: Every Day    Current packs/day: 1.00    Average packs/day: 1 pack/day for 25.0 years (25.0 ttl pk-yrs)    Types: Cigarettes   Smokeless tobacco: Never   Tobacco comments:    15 cigarettes smoked daily 04/26/26 ARJ   Vaping Use   Vaping status: Never Used  Substance and Sexual Activity   Alcohol use: No    Comment: Quit in 2001   Drug use: No    Comment: quit using maryjuana in 2001   Sexual activity: Not Currently  Other Topics Concern   Not on file  Social History Narrative   Lives at home alone.   Right-handed.   Drinks nearly a 2-liter of soda daily.   Social Drivers of Health   Tobacco Use: High Risk (06/08/2024)   Received from [facility]   Patient History    Smoking Tobacco Use: Every Day    Smokeless Tobacco Use: Never    Passive Exposure: Past  Financial Resource Strain: Low Risk (03/30/2024)   Received from [facility]   Overall Financial Resource Strain (CARDIA)    How hard is it for you to pay for the very basics like food, housing, medical care, and heating?: Not hard at  all  Food Insecurity: Low Risk (04/26/2024)   Received from [facility]   Epic    Within the past 12 months, you worried that your food would run out before you got money to buy more: Never true    Within the past 12 months, the food you bought just didn't last and you didn't have money to get more. : Never true  Transportation Needs: No Transportation Needs (04/26/2024)   Received from [facility]    In the past 12 months, has lack of reliable transportation kept you from medical appointments, meetings, work or from getting things needed for daily living? : No  Physical Activity: Inactive (03/30/2024)   Received from [facility]   Exercise Vital Sign    On  average, how many days per week do you engage in moderate to strenuous exercise (like a brisk walk)?: 0 days    Minutes of Exercise per Session: Not on file  Stress: No Stress Concern Present (03/30/2024)   Received from [facility] - Occupational Stress Questionnaire    Do you feel stress - tense, restless, nervous, or anxious, or unable to sleep at night because your mind is troubled all the time - these days?: Not at all  Social Connections: Socially Integrated (03/30/2024)   Received from [facility]   Social Network    How would you rate your social network (family, work, friends)?: Good participation with social networks  Intimate Partner Violence: Not At Risk (03/30/2024)   Received from [facility]   HITS    Over the last 12 months how often did your partner physically hurt you?: Never    Over the last 12 months how often did your partner insult you or talk down to you?: Never    Over the last 12 months how often did your partner threaten you with physical harm?: Never    Over the last 12 months how often did your partner scream or curse at you?: Never  Depression (PHQ2-9): Not on file  Alcohol Screen: Not on file  Housing: Low Risk (04/26/2024)   Received from [facility]   Epic    What is your living situation today?: I have a steady place to live    Think about the place you live. Do you have problems with any of the following? Choose all that apply:: Not on file  Utilities: Low Risk (04/26/2024)   Received from [facility]   Utilities    In the past 12 months has the electric, gas, oil, or water company threatened to shut off services in your home? : No  Health Literacy: Not on file       Objective:  There were no vitals taken for this visit. {Pulm Vitals (Optional):32837}  Physical Exam  Diagnostic Review:  {Labs (Optional):32838}     Assessment & Plan:   Assessment & Plan    No follow-ups on file.     Marny Patch, MD Pulmonary and Critical Care Medicine [facility]     [1]  Current Outpatient Medications:    albuterol  (PROVENTIL  HFA;VENTOLIN  HFA) 108 (90 BASE) MCG/ACT inhaler, Inhale 2 puffs into the lungs 3 (three) times daily as needed. For shortness of breath , Disp: , Rfl:    albuterol  (PROVENTIL ) (2.5 MG/3ML) 0.083% nebulizer solution, Take 2.5 mg by nebulization every 6 (six) hours as needed for wheezing or shortness of breath., Disp: , Rfl:  bisoprolol  (ZEBETA ) 5 MG tablet, Take 2.5 mg by mouth daily., Disp: , Rfl:    cetirizine (ZYRTEC) 10 MG tablet, Take 10 mg by mouth daily., Disp: , Rfl:    colchicine 0.6 MG tablet, Take 0.6 mg by mouth 2 (two) times daily., Disp: , Rfl: 5   cyclobenzaprine  (FLEXERIL ) 10 MG tablet, Take 1 tablet (10 mg total) by mouth 2 (two) times daily as needed for muscle spasms., Disp: 20 tablet, Rfl: 0   Emollient (CERAVE) CREA, Apply 1 application topically 2 (two) times daily as needed (break outs). , Disp: , Rfl:    esomeprazole (NEXIUM) 40 MG capsule, Take 40 mg by mouth 2 (two) times daily.  , Disp: , Rfl:    famotidine (PEPCID) 20 MG tablet, Take 20 mg by mouth at bedtime. With nexium, Disp: , Rfl:    fluconazole (DIFLUCAN) 150 MG tablet, Take 150 mg by mouth once a week., Disp: , Rfl:    fluocinonide ointment (LIDEX) 0.05 %, Apply 1 application topically 2 (two) times daily., Disp: , Rfl:    levothyroxine (SYNTHROID, LEVOTHROID) 112 MCG tablet, Take 112 mcg by mouth daily., Disp: , Rfl:    lidocaine  (XYLOCAINE ) 2 % solution, Take 5 mLs by mouth daily as needed for mouth pain. , Disp: , Rfl:    montelukast  (SINGULAIR ) 10 MG tablet, Take 10 mg by mouth at bedtime.  , Disp: , Rfl:    nystatin  (MYCOSTATIN ) 100000 UNIT/ML suspension, Take 30 mLs by mouth daily as needed (mouth pain). , Disp: , Rfl:    Olopatadine HCl (PATADAY) 0.2 % SOLN, Place 1 drop into both eyes 2 (two) times daily. , Disp: , Rfl:    topiramate   (TOPAMAX ) 100 MG tablet, Take 100 mg by mouth as needed (migraines)., Disp: , Rfl:    triamcinolone  cream (KENALOG ) 0.1 %, Apply topically 3 (three) times daily., Disp: 454 g, Rfl: 2

## 2024-06-15 ENCOUNTER — Ambulatory Visit

## 2024-06-15 ENCOUNTER — Other Ambulatory Visit: Payer: Self-pay

## 2024-06-15 VITALS — BP 178/91 | HR 58 | Temp 98.6°F | Resp 18 | Ht 65.0 in | Wt 157.6 lb

## 2024-06-15 DIAGNOSIS — J439 Emphysema, unspecified: Secondary | ICD-10-CM | POA: Diagnosis not present

## 2024-06-15 DIAGNOSIS — F17200 Nicotine dependence, unspecified, uncomplicated: Secondary | ICD-10-CM

## 2024-06-15 DIAGNOSIS — F17210 Nicotine dependence, cigarettes, uncomplicated: Secondary | ICD-10-CM

## 2024-06-15 DIAGNOSIS — J4489 Other specified chronic obstructive pulmonary disease: Secondary | ICD-10-CM

## 2024-06-15 MED ORDER — TIOTROPIUM BROMIDE-OLODATEROL 2.5-2.5 MCG/ACT IN AERS: 2.0000 | INHALATION_SPRAY | Freq: Every day | RESPIRATORY_TRACT | 6 refills | Status: AC

## 2024-06-15 MED ORDER — NICOTINE 14 MG/24HR TD PT24: 14.0000 mg | MEDICATED_PATCH | Freq: Every day | TRANSDERMAL | 2 refills | Status: AC

## 2024-06-15 MED ORDER — ALBUTEROL SULFATE 0.63 MG/3ML IN NEBU: 1.0000 | INHALATION_SOLUTION | Freq: Four times a day (QID) | RESPIRATORY_TRACT | 3 refills | Status: DC | PRN

## 2024-06-15 NOTE — Patient Instructions (Addendum)
 Dear Ms. Beckam,   I will recommend the following for your COPD: -Stiolto inhaler - 2 puffs daily -Albuterol  inhaler every 4-6 hour as need for shortness of breath. -For chest congestion and mucus production:   .albuterol  nebulizer - twice a day for 2 weeks and then as need it.  .Azithromycin  1 tablet for 5 days. -I will send a referral for lung cancer screening program. -I order a CT scan of your lungs, around January. -I send some nicotine  patches to help you with craving. -I highly recommend to quit smoking by decreasing the number of cigarettes a day over time.  I will see you in 3 months for a follow up, and as well a pulmonary function test.

## 2024-06-24 ENCOUNTER — Ambulatory Visit (HOSPITAL_BASED_OUTPATIENT_CLINIC_OR_DEPARTMENT_OTHER)
Admission: RE | Admit: 2024-06-24 | Discharge: 2024-06-24 | Disposition: A | Discharge status: Home / Self Care | Source: Ambulatory Visit

## 2024-06-24 DIAGNOSIS — F17210 Nicotine dependence, cigarettes, uncomplicated: Secondary | ICD-10-CM | POA: Diagnosis not present

## 2024-06-24 DIAGNOSIS — K2289 Other specified disease of esophagus: Secondary | ICD-10-CM | POA: Insufficient documentation

## 2024-06-24 DIAGNOSIS — F17200 Nicotine dependence, unspecified, uncomplicated: Secondary | ICD-10-CM | POA: Diagnosis present

## 2024-06-24 DIAGNOSIS — I700 Atherosclerosis of aorta: Secondary | ICD-10-CM | POA: Insufficient documentation

## 2024-06-24 DIAGNOSIS — Z122 Encounter for screening for malignant neoplasm of respiratory organs: Secondary | ICD-10-CM | POA: Insufficient documentation

## 2024-06-24 DIAGNOSIS — M438X6 Other specified deforming dorsopathies, lumbar region: Secondary | ICD-10-CM | POA: Diagnosis not present

## 2024-06-24 DIAGNOSIS — J439 Emphysema, unspecified: Secondary | ICD-10-CM | POA: Diagnosis not present

## 2024-07-03 ENCOUNTER — Telehealth: Payer: Self-pay

## 2024-07-03 DIAGNOSIS — K209 Esophagitis, unspecified without bleeding: Secondary | ICD-10-CM

## 2024-07-03 NOTE — Telephone Encounter (Signed)
 CT scan results and placing GI referral.

## 2024-07-03 NOTE — Telephone Encounter (Signed)
 Order has been sent to GI

## 2024-07-03 NOTE — Telephone Encounter (Signed)
 Copied from CRM [id number]. Topic: Clinical - Lab/Test Results >> Jul 03, 2024 11:00 AM Corean SAUNDERS wrote: Reason for CRM: MJ with [facility] states patients lung cancer screening Ct results are now available in patients chart.

## 2024-07-03 NOTE — Telephone Encounter (Signed)
ATC x1.  LMTCB. 

## 2024-07-04 ENCOUNTER — Telehealth: Payer: Self-pay

## 2024-07-04 ENCOUNTER — Other Ambulatory Visit: Payer: Self-pay

## 2024-07-04 MED ORDER — ALBUTEROL SULFATE 0.63 MG/3ML IN NEBU: 1.0000 | INHALATION_SOLUTION | Freq: Four times a day (QID) | RESPIRATORY_TRACT | 3 refills | Status: AC | PRN

## 2024-07-04 NOTE — Telephone Encounter (Signed)
" °  Spoke with [facility], gave CT results als-NFN     Copied from CRM #[id number]. Topic: Clinical - Prescription Issue >> Jul 03, 2024  3:45 PM Denise Simmons wrote: Reason for CRM: Patient states she received her nebulizer machine but never received the solution.   Pharmacy [facility] [address],  - [address] [place] [address] Phone: [phone]  Fax: [phone] DEA #: [id number] "

## 2024-07-04 NOTE — Telephone Encounter (Signed)
 Patient is returning Noel's call.  Please call her back at [phone]

## 2024-07-06 NOTE — Telephone Encounter (Signed)
 See separate encounter. NFN

## 2024-07-17 NOTE — Telephone Encounter (Signed)
 Copied from CRM [id number]. Topic: Clinical - Lab/Test Results >> Jul 03, 2024 11:00 AM Corean SAUNDERS wrote: Reason for CRM: MJ with [facility] states patients lung cancer screening Ct results are now available in patients chart. >> Jul 17, 2024 11:06 AM Rozanna MATSU wrote: Pt calling [facility] about results, would l ike a call. The nurse that called is out today per CAL.   --------------------------------------------------------------------------------------------------------------------------------------------  I called and spoke with patient, provided results/recommendations per Dr. Palmer.  She states she was supposed to have an upper GI back in August, but did not have anyone to stay with her.  She has not been able to keep anything down for 2 weeks.  The GI doctor at [facility] just wants to keep pumping her full of blood.  She is requesting to see a GI doctor that is not part of [facility].  She has thrush from the nebulizer treatments she has been doing.  She has stopped doing them.  I let her know that I would make Dr. Adrien Guan aware and we will call her back with her recommendations.  She verbalized understanding.  Dr. Adrien Guan, Patient states she no longer wants to see GI MD at [facility].  She has not been able to keep anything down in 2 weeks.  Please advise.  Thank you.

## 2024-07-19 ENCOUNTER — Telehealth: Payer: Self-pay

## 2024-07-19 NOTE — Telephone Encounter (Signed)
 Pt is requesting a call back about her results. She was transferred to our line, however she is not in the [facility] program.   Thanks Isaiah, RN

## 2024-07-19 NOTE — Telephone Encounter (Addendum)
 I called patient today, and she was complaining of nausea, and vomiting multiple times a day for the last 3 weeks and she is not able to eat. She said GI [facility] has been difficult to obtain appointments. I mentioned there not much I can do for her over the phone, givent that this is ongoing problem, I highly recommend her to go to the ER, and have an expedite evaluation about this.  She appreciate the call, and all her questions were answered.  Marny Patch, MD

## 2024-07-19 NOTE — Telephone Encounter (Signed)
 Dr. Margarette, please disregard.

## 2024-07-19 NOTE — Telephone Encounter (Signed)
 Dr. Adrien Guan sent me a secure chat:  hi I am currently in the icu, if patient is having an emergency she should go to the ER  12:37 PM I placed a GI referral, idk their schedule, or how soon this will happen.   LS if I have time I can call her, but I I wll tell her to go to the ER for evaluation for nausea and vomiting

## 2024-07-19 NOTE — Telephone Encounter (Signed)
 Error

## 2024-07-19 NOTE — Telephone Encounter (Unsigned)
 Copied from CRM [id number]. Topic: Clinical - Lab/Test Results >> Jul 03, 2024 11:00 AM Corean SAUNDERS wrote: Reason for CRM: MJ with [facility] states patients lung cancer screening Ct results are now available in patients chart. >> Jul 19, 2024  1:48 PM Leila BROCKS wrote: Patient [phone] is upset, has not heard from the office on CT scan results and referral information, and wants to speak with nurse. Per CAL, warm transferred to CAL.  >> Jul 17, 2024 11:06 AM Rozanna G wrote: Pt calling [facility] about results, would l ike a call. The nurse that called is out today per CAL.

## 2024-07-21 NOTE — Telephone Encounter (Signed)
ATC, no answer. LMTCB

## 2024-09-25 ENCOUNTER — Encounter

## 2024-09-25 ENCOUNTER — Ambulatory Visit
